# Patient Record
Sex: Male | Born: 1970
Health system: Southern US, Community
[De-identification: ages and names within clinical notes are randomized; demographics above are authoritative.]

## PROBLEM LIST (undated history)

## (undated) DIAGNOSIS — E785 Hyperlipidemia, unspecified: Secondary | ICD-10-CM

## (undated) DIAGNOSIS — I2510 Atherosclerotic heart disease of native coronary artery without angina pectoris: Secondary | ICD-10-CM

## (undated) DIAGNOSIS — F1410 Cocaine abuse, uncomplicated: Secondary | ICD-10-CM

## (undated) DIAGNOSIS — F1010 Alcohol abuse, uncomplicated: Secondary | ICD-10-CM

## (undated) DIAGNOSIS — Z720 Tobacco use: Secondary | ICD-10-CM

## (undated) DIAGNOSIS — I2102 ST elevation (STEMI) myocardial infarction involving left anterior descending coronary artery: Secondary | ICD-10-CM

## (undated) DIAGNOSIS — I10 Essential (primary) hypertension: Secondary | ICD-10-CM

## (undated) HISTORY — DX: ST elevation (STEMI) myocardial infarction involving left anterior descending coronary artery: I21.02

---

## 2001-02-07 ENCOUNTER — Emergency Department (HOSPITAL_COMMUNITY): Admission: EM | Admit: 2001-02-07 | Discharge: 2001-02-08 | Payer: Self-pay | Admitting: Emergency Medicine

## 2001-02-08 ENCOUNTER — Encounter: Payer: Self-pay | Admitting: *Deleted

## 2002-06-12 ENCOUNTER — Emergency Department (HOSPITAL_COMMUNITY): Admission: EM | Admit: 2002-06-12 | Discharge: 2002-06-12 | Payer: Self-pay | Admitting: Emergency Medicine

## 2002-06-12 ENCOUNTER — Encounter: Payer: Self-pay | Admitting: Emergency Medicine

## 2006-11-12 ENCOUNTER — Emergency Department (HOSPITAL_COMMUNITY): Admission: EM | Admit: 2006-11-12 | Discharge: 2006-11-12 | Payer: Self-pay | Admitting: *Deleted

## 2006-11-13 ENCOUNTER — Emergency Department (HOSPITAL_COMMUNITY): Admission: EM | Admit: 2006-11-13 | Discharge: 2006-11-13 | Payer: Self-pay | Admitting: Emergency Medicine

## 2009-08-05 ENCOUNTER — Emergency Department (HOSPITAL_COMMUNITY): Admission: EM | Admit: 2009-08-05 | Discharge: 2009-08-05 | Payer: Self-pay | Admitting: Emergency Medicine

## 2011-02-08 ENCOUNTER — Emergency Department (HOSPITAL_COMMUNITY)
Admission: EM | Admit: 2011-02-08 | Discharge: 2011-02-08 | Disposition: A | Discharge status: Home / Self Care | Payer: Self-pay | Attending: Emergency Medicine | Admitting: Emergency Medicine

## 2011-02-08 ENCOUNTER — Encounter: Payer: Self-pay | Admitting: *Deleted

## 2011-02-08 ENCOUNTER — Ambulatory Visit (HOSPITAL_COMMUNITY): Admission: RE | Admit: 2011-02-08 | Payer: Self-pay | Source: Ambulatory Visit

## 2011-02-08 ENCOUNTER — Emergency Department (HOSPITAL_COMMUNITY): Payer: Self-pay

## 2011-02-08 DIAGNOSIS — J9801 Acute bronchospasm: Secondary | ICD-10-CM | POA: Insufficient documentation

## 2011-02-08 DIAGNOSIS — R079 Chest pain, unspecified: Secondary | ICD-10-CM | POA: Insufficient documentation

## 2011-02-08 DIAGNOSIS — R0602 Shortness of breath: Secondary | ICD-10-CM | POA: Insufficient documentation

## 2011-02-08 DIAGNOSIS — J40 Bronchitis, not specified as acute or chronic: Secondary | ICD-10-CM | POA: Insufficient documentation

## 2011-02-08 DIAGNOSIS — IMO0001 Reserved for inherently not codable concepts without codable children: Secondary | ICD-10-CM | POA: Insufficient documentation

## 2011-02-08 DIAGNOSIS — J069 Acute upper respiratory infection, unspecified: Secondary | ICD-10-CM | POA: Insufficient documentation

## 2011-02-08 DIAGNOSIS — R062 Wheezing: Secondary | ICD-10-CM | POA: Insufficient documentation

## 2011-02-08 DIAGNOSIS — F17200 Nicotine dependence, unspecified, uncomplicated: Secondary | ICD-10-CM | POA: Insufficient documentation

## 2011-02-08 DIAGNOSIS — R05 Cough: Secondary | ICD-10-CM | POA: Insufficient documentation

## 2011-02-08 DIAGNOSIS — R059 Cough, unspecified: Secondary | ICD-10-CM | POA: Insufficient documentation

## 2011-02-08 MED ORDER — ALBUTEROL SULFATE HFA 108 (90 BASE) MCG/ACT IN AERS
2.0000 | INHALATION_SPRAY | RESPIRATORY_TRACT | Status: DC | PRN
  Administered 2011-02-08: 2 via RESPIRATORY_TRACT
  Filled 2011-02-08: qty 6.7

## 2011-02-08 MED ORDER — AZITHROMYCIN 250 MG PO TABS: 250.0000 mg | ORAL_TABLET | Freq: Every day | ORAL | Status: AC

## 2011-02-08 NOTE — ED Notes (Signed)
Patient reports onset of cold sx over 1 week ago.  He has cough.  He states it feels like his ribs are going to pop open.  Patient states his coughing spell is worse when he is laying down.  Patient states he cannot get up the phlegm.  Patient states he is having sob.  Patient states he feels like he has had a fever.  Patient states he has abd pain and rib pain due to coughing

## 2011-02-08 NOTE — ED Notes (Signed)
Complaining of pain in mid back. Also chest pain with cough. Feels like pressure when lying down. Rates pain as 8/10 and 10/10 with cough

## 2011-02-08 NOTE — ED Notes (Signed)
States he has had cough for the past 2 weeks. Worse at night lying down. Nonproductive cough. Also complaining of stomach cramping, headache and sweating with cough. OTC with no relief. States hurts with deep breaths. Coworkers have also been sick

## 2011-02-08 NOTE — ED Provider Notes (Signed)
History     CSN: [id number] Arrival date & time: 02/08/2011  1:57 PM   First MD Initiated Contact with Patient 02/08/11 1531      Chief Complaint  Patient presents with  . URI    (Consider location/radiation/quality/duration/timing/severity/associated sxs/prior treatment) Patient is a 40 y.o. male presenting with cough. The history is provided by the patient. No language interpreter was used.  Cough This is a new problem. The current episode started more than 1 week ago. The problem occurs every few minutes. The problem has been gradually worsening. The cough is non-productive. There has been no fever. Associated symptoms include chest pain, myalgias, shortness of breath and wheezing. He is a smoker.    History reviewed. No pertinent past medical history.  History reviewed. No pertinent past surgical history.  No family history on file.  History  Substance Use Topics  . Smoking status: Current Everyday Smoker  . Smokeless tobacco: Not on file  . Alcohol Use: Yes      Review of Systems  Respiratory: Positive for cough, shortness of breath and wheezing.   Cardiovascular: Positive for chest pain.  Musculoskeletal: Positive for myalgias.  All other systems reviewed and are negative.    Allergies  Review of patient's allergies indicates no known allergies.  Home Medications   Current Outpatient Rx  Name Route Sig Dispense Refill  . GUAIFENESIN 100 MG/5ML PO LIQD Oral Take 200 mg by mouth 3 (three) times daily as needed. cough     . IBUPROFEN 200 MG PO TABS Oral Take 200 mg by mouth every 6 (six) hours as needed. Pain / fever       BP 130/74  Pulse 114  Temp(Src) 97.9 F (36.6 C) (Oral)  Resp 20  Ht 5' 10.5" (1.791 m)  Wt 272 lb (123.378 kg)  BMI 38.48 kg/m2  SpO2 100%  Physical Exam  Nursing note and vitals reviewed. Constitutional: He is oriented to person, place, and time. He appears well-developed and well-nourished.  HENT:  Head: Normocephalic.    Eyes: Conjunctivae are normal. Pupils are equal, round, and reactive to light.  Neck: Normal range of motion. Neck supple.  Cardiovascular: Normal rate and regular rhythm.   Pulmonary/Chest: Effort normal. He has decreased breath sounds in the left upper field and the left lower field.  Abdominal: Soft. Bowel sounds are normal.  Musculoskeletal: Normal range of motion.  Neurological: He is alert and oriented to person, place, and time.  Skin: Skin is warm.  Psychiatric: He has a normal mood and affect.    ED Course  Procedures (including critical care time)  Labs Reviewed - No data to display Dg Chest 2 View  02/08/2011  *RADIOLOGY REPORT*  Clinical Data: Cough.  Chest pain.  Smoker.  CHEST - 2 VIEW  Comparison: None.  Findings: Mild central peribronchial thickening is seen bilaterally.  No evidence of pulmonary infiltrate or edema.  No evidence of pleural effusion.  Heart size is normal.  No mass or lymphadenopathy identified.  IMPRESSION: No active cardiopulmonary disease.  Original Report Authenticated By: Danae Orleans, M.D.     No diagnosis found.  Radiology results reviewed. No indication of pneumonia or infiltrates.  Peribronchial thickening--? Bronchitis.  MDM          Jimmye Norman, NP 02/09/11 [phone]

## 2011-02-11 NOTE — ED Provider Notes (Signed)
Medical screening examination/treatment/procedure(s) were performed by non-physician practitioner and as supervising physician I was immediately available for consultation/collaboration.  Juliet Rude. Rubin Payor, MD 02/11/11 [phone]

## 2011-12-11 ENCOUNTER — Emergency Department (HOSPITAL_COMMUNITY)
Admission: EM | Admit: 2011-12-11 | Discharge: 2011-12-11 | Disposition: A | Discharge status: Home / Self Care | Payer: No Typology Code available for payment source | Attending: Emergency Medicine | Admitting: Emergency Medicine

## 2011-12-11 ENCOUNTER — Encounter (HOSPITAL_COMMUNITY): Payer: Self-pay | Admitting: *Deleted

## 2011-12-11 DIAGNOSIS — M545 Low back pain, unspecified: Secondary | ICD-10-CM | POA: Insufficient documentation

## 2011-12-11 DIAGNOSIS — F17200 Nicotine dependence, unspecified, uncomplicated: Secondary | ICD-10-CM | POA: Insufficient documentation

## 2011-12-11 DIAGNOSIS — S2341XA Sprain of ribs, initial encounter: Secondary | ICD-10-CM | POA: Insufficient documentation

## 2011-12-11 DIAGNOSIS — IMO0002 Reserved for concepts with insufficient information to code with codable children: Secondary | ICD-10-CM

## 2011-12-11 DIAGNOSIS — M79609 Pain in unspecified limb: Secondary | ICD-10-CM | POA: Insufficient documentation

## 2011-12-11 MED ORDER — CYCLOBENZAPRINE HCL 10 MG PO TABS: 10.0000 mg | ORAL_TABLET | Freq: Two times a day (BID) | ORAL | Status: DC | PRN

## 2011-12-11 MED ORDER — IBUPROFEN 800 MG PO TABS: 800.0000 mg | ORAL_TABLET | Freq: Three times a day (TID) | ORAL | Status: DC
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
## 2011-12-11 NOTE — ED Provider Notes (Signed)
Medical screening examination/treatment/procedure(s) were performed by non-physician practitioner and as supervising physician I was immediately available for consultation/collaboration.  Flint Melter, MD 12/11/11 2028
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
## 2015-02-14 ENCOUNTER — Encounter (HOSPITAL_COMMUNITY): Payer: Self-pay

## 2015-02-14 ENCOUNTER — Emergency Department (HOSPITAL_COMMUNITY)
Admission: EM | Admit: 2015-02-14 | Discharge: 2015-02-14 | Disposition: A | Discharge status: Home / Self Care | Payer: Self-pay | Attending: Emergency Medicine | Admitting: Emergency Medicine

## 2015-02-14 DIAGNOSIS — R51 Headache: Secondary | ICD-10-CM | POA: Insufficient documentation

## 2015-02-14 DIAGNOSIS — Z7982 Long term (current) use of aspirin: Secondary | ICD-10-CM | POA: Insufficient documentation

## 2015-02-14 DIAGNOSIS — F17200 Nicotine dependence, unspecified, uncomplicated: Secondary | ICD-10-CM | POA: Insufficient documentation

## 2015-02-14 DIAGNOSIS — Z79899 Other long term (current) drug therapy: Secondary | ICD-10-CM | POA: Insufficient documentation

## 2015-02-14 DIAGNOSIS — H0014 Chalazion left upper eyelid: Secondary | ICD-10-CM | POA: Insufficient documentation

## 2015-02-14 NOTE — Care Management (Signed)
ED CM consulted by H. Jamse Mead PA-C regarding establishing primary  care, and referral to opthalmology. Met with patient , patient confirms being uninsured and without a PCP.  Discussed that EDP is wanting patient to follow-up as soon as possible with Opthalmology, he is amendable. Also discussed the [facility] for establishing care for referral he is agreeable Appointment scheduled at the [facility]   12/14 at 10:00, referral placed via [facility] at the office  Dr.Narenda Posey Pronto for consult. Updated H Patel PA-C.

## 2015-02-14 NOTE — Discharge Instructions (Signed)
Chalazion Apply warm compresses to the area several times a day. A chalazion is a swelling or lump on the eyelid. It can affect the upper or lower eyelid. CAUSES This condition may be caused by:  Long-lasting (chronic) inflammation of the eyelid glands.  A blocked oil gland in the eyelid. SYMPTOMS Symptoms of this condition include:  A swelling on the eyelid. The swelling may spread to areas around the eye.  A hard lump on the eyelid. This lump may make it hard to see out of the eye. DIAGNOSIS This condition is diagnosed with an examination of the eye. TREATMENT This condition is treated by applying a warm compress to the eyelid. If the condition does not improve after two days, it may be treated with:  Surgery.  Medicine that is injected into the chalazion by a health care provider.  Medicine that is applied to the eye. HOME CARE INSTRUCTIONS  Do not touch the chalazion.  Do not try to remove the pus, such as by squeezing the chalazion or sticking it with a pin or needle.  Do not rub your eyes.  Wash your hands often. Dry your hands with a clean towel.  Keep your face, scalp, and eyebrows clean.  Avoid wearing eye makeup.  Apply a warm, moist compress to the eyelid 4-6 times a day for 10-15 minutes at a time. This will help to open any blocked glands and help to reduce redness and swelling.  Apply over-the-counter and prescription medicines only as told by your health care provider.  If the chalazion does not break open (rupture) on its own in a month, return to your health care provider.  Keep all follow-up appointments as told by your health care provider. This is important. SEEK MEDICAL CARE IF:  Your eyelid has not improved in 4 weeks.  Your eyelid is getting worse.  You have a fever.  The chalazion does not rupture on its own with home treatment in a month. SEEK IMMEDIATE MEDICAL CARE IF:  You have pain in your eye.  Your vision changes.  The  chalazion becomes painful or red  The chalazion gets bigger.   This information is not intended to replace advice given to you by your health care provider. Make sure you discuss any questions you have with your health care provider.   Document Released: 02/16/2000 Document Revised: 11/09/2014 Document Reviewed: 06/13/2014 Elsevier Interactive Patient Education [employer]2016 [employer] Inc.

## 2015-02-14 NOTE — ED Provider Notes (Signed)
CSN: [id number]     Arrival date & time 02/14/15  1655 History  By signing my name below, I, Octavia Heir, attest that this documentation has been prepared under the direction and in the presence of [employer], PA-C. Electronically Signed: Octavia Heir, ED Scribe. 02/14/2015. 7:56 PM.    Chief Complaint  Patient presents with  . Eye Pain    The history is provided by the patient. No language interpreter was used.   HPI Comments: Rodney Lloyd is a 44 y.o. male who presents to the Emergency Department complaining of a moderate, constant, gradual worsening lump on left eyelid onset 3 months ago. He endorses associated headache and blurry vision. Pt notes the bump on his inner eyelid was small and became gradually worse over the past two weeks. He denies any prior treatment. He denies any fever.  History reviewed. No pertinent past medical history. History reviewed. No pertinent past surgical history. Family History  Problem Relation Age of Onset  . Hyperlipidemia Mother   . Heart disease Father    Social History  Substance Use Topics  . Smoking status: Current Every Day Smoker  . Smokeless tobacco: None  . Alcohol Use: Yes    Review of Systems  Constitutional: Negative for fever.  Eyes: Positive for pain and visual disturbance.  Neurological: Positive for headaches.      Allergies  Review of patient's allergies indicates no known allergies.  Home Medications   Prior to Admission medications   Medication Sig Start Date End Date Taking? Authorizing Provider  acetaminophen (TYLENOL) 325 MG tablet Take 650 mg by mouth every 6 (six) hours as needed.    Historical Provider, MD  aspirin 325 MG tablet Take 325 mg by mouth 2 (two) times daily as needed. Reported on 02/15/2015    Historical Provider, MD  cyclobenzaprine (FLEXERIL) 10 MG tablet Take 1 tablet (10 mg total) by mouth 2 (two) times daily as needed for muscle spasms. Patient not taking: Reported on 02/15/2015  12/11/11   Roxy Horseman, PA-C  hydrochlorothiazide (HYDRODIURIL) 25 MG tablet Take 1 tablet (25 mg total) by mouth daily. 02/15/15   Jaclyn Shaggy, MD  ibuprofen (ADVIL,MOTRIN) 800 MG tablet Take 1 tablet (800 mg total) by mouth 3 (three) times daily. Patient not taking: Reported on 02/15/2015 12/11/11   Roxy Horseman, PA-C  nicotine (NICODERM CQ - DOSED IN MG/24 HOURS) 14 mg/24hr patch Place 1 patch (14 mg total) onto the skin daily. 02/15/15   Jaclyn Shaggy, MD   Triage vitals: BP 175/107 mmHg  Pulse 72  Temp(Src) 98.1 F (36.7 C) (Oral)  Resp 20  SpO2 98% Physical Exam  Constitutional: He is oriented to person, place, and time. He appears well-developed and well-nourished. No distress.  HENT:  Head: Normocephalic and atraumatic.  Eyes: Conjunctivae and EOM are normal.  EOMs intact, PERRL, conjunctiva is normal. Painful large nodule on the left upper eyelid with no surrounding erythema and no active drainage. The nodule is causing the upper eyelid to drool and encroaching on his visual field.   Neck: Normal range of motion. Neck supple.  Cardiovascular: Normal rate, regular rhythm and normal heart sounds.   Pulmonary/Chest: Effort normal and breath sounds normal.  Musculoskeletal: Normal range of motion. He exhibits no edema.  Neurological: He is alert and oriented to person, place, and time.  Skin: Skin is warm and dry.  Psychiatric: He has a normal mood and affect. His behavior is normal.  Nursing note and vitals reviewed.  ED Course  Procedures  DIAGNOSTIC STUDIES: Oxygen Saturation is 98% on RA, normal by my interpretation.  COORDINATION OF CARE:  7:56 PM Discussed treatment plan with pt at bedside and pt agreed to plan.  Labs Review Labs Reviewed - No data to display  Imaging Review No results found.   EKG Interpretation None      MDM  Pt presents to the ED with moderate, gradual worsening chalazion in left upper eyelid onset 3 weeks ago. He has a chalazion  over the left eyelid. There is no signs of infection and antibiotics is not indicated for chalazion. His vision changes are due to his drooping eyelid but he denies any actual change in vision. I discussed this patient with Burna MortimerWanda from case management since the patient does not have insurance and will most likely need surgery. He was given a referral for ophthalmology. I discussed applying warm compresses to the area. I also discussed return precautions the patient and he verbally agrees with the plan. Final diagnoses:  Chalazion of left upper eyelid   I personally performed the services described in this documentation, which was scribed in my presence. The recorded information has been reviewed and is accurate.   Catha GosselinHanna Patel-Mills, PA-C 02/17/15 1520  Raeford RazorStephen Kohut, MD 02/21/15 1245

## 2015-02-14 NOTE — ED Notes (Signed)
Pt here with c/o left eye pain and has a bump on his upper eye lid, noticed a couple of weeks ago. It started off a very small but got larger within the past week. Conjunctiva WDL, no redness.

## 2015-02-15 ENCOUNTER — Ambulatory Visit: Payer: Self-pay | Attending: Family Medicine | Admitting: Family Medicine

## 2015-02-15 ENCOUNTER — Encounter: Payer: Self-pay | Admitting: Family Medicine

## 2015-02-15 VITALS — BP 142/90 | HR 81 | Temp 98.0°F | Resp 14 | Ht 70.5 in | Wt 210.6 lb

## 2015-02-15 DIAGNOSIS — Z7982 Long term (current) use of aspirin: Secondary | ICD-10-CM | POA: Insufficient documentation

## 2015-02-15 DIAGNOSIS — H578 Other specified disorders of eye and adnexa: Secondary | ICD-10-CM | POA: Insufficient documentation

## 2015-02-15 DIAGNOSIS — I10 Essential (primary) hypertension: Secondary | ICD-10-CM | POA: Insufficient documentation

## 2015-02-15 DIAGNOSIS — H5789 Other specified disorders of eye and adnexa: Secondary | ICD-10-CM

## 2015-02-15 DIAGNOSIS — Z720 Tobacco use: Secondary | ICD-10-CM | POA: Insufficient documentation

## 2015-02-15 LAB — COMPREHENSIVE METABOLIC PANEL
ALK PHOS: 69 U/L (ref 40–115)
ALT: 13 U/L (ref 9–46)
AST: 18 U/L (ref 10–40)
Albumin: 4.1 g/dL (ref 3.6–5.1)
BILIRUBIN TOTAL: 0.4 mg/dL (ref 0.2–1.2)
BUN: 17 mg/dL (ref 7–25)
CO2: 27 mmol/L (ref 20–31)
Calcium: 9.5 mg/dL (ref 8.6–10.3)
Chloride: 105 mmol/L (ref 98–110)
Creat: 1.13 mg/dL (ref 0.60–1.35)
GLUCOSE: 82 mg/dL (ref 65–99)
Potassium: 4.3 mmol/L (ref 3.5–5.3)
SODIUM: 141 mmol/L (ref 135–146)
Total Protein: 6.9 g/dL (ref 6.1–8.1)

## 2015-02-15 MED ORDER — HYDROCHLOROTHIAZIDE 25 MG PO TABS
25.0000 mg | ORAL_TABLET | Freq: Every day | ORAL | Status: DC
Start: 2015-02-15 — End: 2018-01-13

## 2015-02-15 MED ORDER — NICOTINE 14 MG/24HR TD PT24: 14.0000 mg | MEDICATED_PATCH | Freq: Every day | TRANSDERMAL | Status: DC

## 2015-02-15 NOTE — Progress Notes (Signed)
Subjective:  Patient ID: Rodney Lloyd, male    DOB: 04-17-1970  Age: 44 y.o. MRN: [id number]  CC: Establish Care and Eye Pain   HPI Rodney Lloyd is a 44 year old male who was seen at the emergency room for left eyelid swelling which he has had for the last 2 months and has progressively worsened over the last 3 weeks with increase in size and occasional pain and sometimes early morning crusting. He was referred to the clinic so we could refer him to ophthalmology. He admits to having intermittent swelling of his left upper eyelid which he thought was a stye with resulting resolution and recurrence only this time around it  continued to increase in size. He denies visual loss but does state that the swollen eyelid obscures part of his visual field.  Blood pressure is elevated and he had no previous known history of hypertension.  Outpatient Prescriptions Prior to Visit  Medication Sig Dispense Refill  . aspirin 325 MG tablet Take 325 mg by mouth 2 (two) times daily as needed. Reported on 02/15/2015    . cyclobenzaprine (FLEXERIL) 10 MG tablet Take 1 tablet (10 mg total) by mouth 2 (two) times daily as needed for muscle spasms. (Patient not taking: Reported on 02/15/2015) 14 tablet 0  . ibuprofen (ADVIL,MOTRIN) 800 MG tablet Take 1 tablet (800 mg total) by mouth 3 (three) times daily. (Patient not taking: Reported on 02/15/2015) 21 tablet 0   No facility-administered medications prior to visit.    ROS Review of Systems  Constitutional: Negative for activity change and appetite change.  HENT: Negative for sinus pressure and sore throat.   Eyes:       See history of present illness  Respiratory: Negative for chest tightness, shortness of breath and wheezing.   Gastrointestinal: Negative for abdominal pain, constipation and abdominal distention.  Genitourinary: Negative.   Musculoskeletal: Negative.   Psychiatric/Behavioral: Negative for behavioral problems and dysphoric mood.     Objective:  BP 142/90 mmHg  Pulse 81  Temp(Src) 98 F (36.7 C)  Resp 14  Ht 5' 10.5" (1.791 m)  Wt 210 lb 9.6 oz (95.528 kg)  BMI 29.78 kg/m2  SpO2 98%  BP/Weight 02/15/2015 02/14/2015 12/11/2011  Systolic BP 142 169 141  Diastolic BP 90 97 95  Wt. (Lbs) 210.6 - -  BMI 29.78 - -      Physical Exam  Constitutional: He is oriented to person, place, and time. He appears well-developed and well-nourished.  Eyes:  Left upper eyelid with 1 x 1 cm fluctuant mass which is mildly tender. No eye discharge or erythema. Right eye is normal  Cardiovascular: Normal rate, normal heart sounds and intact distal pulses.   No murmur heard. Pulmonary/Chest: Effort normal and breath sounds normal. He has no wheezes. He has no rales. He exhibits no tenderness.  Abdominal: Soft. Bowel sounds are normal. He exhibits no distension and no mass. There is no tenderness.  Musculoskeletal: Normal range of motion.  Neurological: He is alert and oriented to person, place, and time.     Assessment & Plan:   1. Essential hypertension Commenced on antihypertensive due to the presence of 2 separate elevated blood pressure readings on 2 different days. - Comprehensive metabolic panel - hydrochlorothiazide (HYDRODIURIL) 25 MG tablet; Take 1 tablet (25 mg total) by mouth daily.  Dispense: 90 tablet; Refill: 3  2. Eye swelling or mass I have sent a message to the referral coordinator for resources available for referral as  he has no medical coverage; will try to expedite [employer] discount application process. Discussed return and ED precautions. - Ambulatory referral to Ophthalmology  3. Tobacco abuse Educated on smoking cessation. - nicotine (NICODERM CQ - DOSED IN MG/24 HOURS) 14 mg/24hr patch; Place 1 patch (14 mg total) onto the skin daily.  Dispense: 28 patch; Refill: 1   Meds ordered this encounter  Medications  . hydrochlorothiazide (HYDRODIURIL) 25 MG tablet    Sig: Take 1 tablet (25 mg total)  by mouth daily.    Dispense:  90 tablet    Refill:  3  . nicotine (NICODERM CQ - DOSED IN MG/24 HOURS) 14 mg/24hr patch    Sig: Place 1 patch (14 mg total) onto the skin daily.    Dispense:  28 patch    Refill:  1    Follow-up: No Follow-up on file.   Jaclyn ShaggyEnobong Amao MD

## 2015-02-15 NOTE — Patient Instructions (Signed)
Hypertension Hypertension, commonly called high blood pressure, is when the force of blood pumping through your arteries is too strong. Your arteries are the blood vessels that carry blood from your heart throughout your body. A blood pressure reading consists of a higher number over a lower number, such as 110/72. The higher number (systolic) is the pressure inside your arteries when your heart pumps. The lower number (diastolic) is the pressure inside your arteries when your heart relaxes. Ideally you want your blood pressure below 120/80. Hypertension forces your heart to work harder to pump blood. Your arteries may become narrow or stiff. Having untreated or uncontrolled hypertension can cause heart attack, stroke, kidney disease, and other problems. RISK FACTORS Some risk factors for high blood pressure are controllable. Others are not.  Risk factors you cannot control include:   Race. You may be at higher risk if you are African American.  Age. Risk increases with age.  Gender. Men are at higher risk than women before age 45 years. After age 65, women are at higher risk than men. Risk factors you can control include:  Not getting enough exercise or physical activity.  Being overweight.  Getting too much fat, sugar, calories, or salt in your diet.  Drinking too much alcohol. SIGNS AND SYMPTOMS Hypertension does not usually cause signs or symptoms. Extremely high blood pressure (hypertensive crisis) may cause headache, anxiety, shortness of breath, and nosebleed. DIAGNOSIS To check if you have hypertension, your health care provider will measure your blood pressure while you are seated, with your arm held at the level of your heart. It should be measured at least twice using the same arm. Certain conditions can cause a difference in blood pressure between your right and left arms. A blood pressure reading that is higher than normal on one occasion does not mean that you need treatment. If  it is not clear whether you have high blood pressure, you may be asked to return on a different day to have your blood pressure checked again. Or, you may be asked to monitor your blood pressure at home for 1 or more weeks. TREATMENT Treating high blood pressure includes making lifestyle changes and possibly taking medicine. Living a healthy lifestyle can help lower high blood pressure. You may need to change some of your habits. Lifestyle changes may include:  Following the DASH diet. This diet is high in fruits, vegetables, and whole grains. It is low in salt, red meat, and added sugars.  Keep your sodium intake below 2,300 mg per day.  Getting at least 30-45 minutes of aerobic exercise at least 4 times per week.  Losing weight if necessary.  Not smoking.  Limiting alcoholic beverages.  Learning ways to reduce stress. Your health care provider may prescribe medicine if lifestyle changes are not enough to get your blood pressure under control, and if one of the following is true:  You are 18-59 years of age and your systolic blood pressure is above 140.  You are 60 years of age or older, and your systolic blood pressure is above 150.  Your diastolic blood pressure is above 90.  You have diabetes, and your systolic blood pressure is over 140 or your diastolic blood pressure is over 90.  You have kidney disease and your blood pressure is above 140/90.  You have heart disease and your blood pressure is above 140/90. Your personal target blood pressure may vary depending on your medical conditions, your age, and other factors. HOME CARE INSTRUCTIONS    Have your blood pressure rechecked as directed by your health care provider.   Take medicines only as directed by your health care provider. Follow the directions carefully. Blood pressure medicines must be taken as prescribed. The medicine does not work as well when you skip doses. Skipping doses also puts you at risk for  problems.  Do not smoke.   Monitor your blood pressure at home as directed by your health care provider. SEEK MEDICAL CARE IF:   You think you are having a reaction to medicines taken.  You have recurrent headaches or feel dizzy.  You have swelling in your ankles.  You have trouble with your vision. SEEK IMMEDIATE MEDICAL CARE IF:  You develop a severe headache or confusion.  You have unusual weakness, numbness, or feel faint.  You have severe chest or abdominal pain.  You vomit repeatedly.  You have trouble breathing. MAKE SURE YOU:   Understand these instructions.  Will watch your condition.  Will get help right away if you are not doing well or get worse.   This information is not intended to replace advice given to you by your health care provider. Make sure you discuss any questions you have with your health care provider.   Document Released: 02/18/2005 Document Revised: 07/05/2014 Document Reviewed: 12/11/2012 Elsevier Interactive Patient Education 2016 [employer] Inc[employer]  

## 2015-02-15 NOTE — Progress Notes (Signed)
Patient reports ED tells him he needs eye surgery to remove growth on his left eye Growth has been on his eye for several months and has recently grown larger He does not have the [employer] discount or orange card but does have the application ED put in opthalmology referral last night RN explained to patient that he needed orange card before he would be see. Patient would like information on quitting smoking

## 2015-02-16 ENCOUNTER — Telehealth: Payer: Self-pay

## 2015-02-16 NOTE — Telephone Encounter (Signed)
-----   Message from Jaclyn ShaggyEnobong Amao, MD sent at 02/16/2015  8:51 AM EST ----- Please inform the patient that labs are normal. Thank you.

## 2015-02-16 NOTE — Telephone Encounter (Signed)
CMA called patient, patient did not answer. Patient was left a voice message asking to return my call asap.

## 2015-02-17 ENCOUNTER — Encounter: Payer: Self-pay | Admitting: Family Medicine

## 2015-02-17 NOTE — Telephone Encounter (Signed)
CMA called patient, patient did not answer. Message was left for patient to call me asap.

## 2015-02-17 NOTE — Telephone Encounter (Signed)
Patient returned nurse phone call Best contacted between 12 and 1

## 2015-02-20 NOTE — Telephone Encounter (Signed)
CMA called patient, patient verified name and DOB. Patient was given lab results, verbalized he understood with no further questions. 

## 2015-03-10 ENCOUNTER — Ambulatory Visit: Payer: Self-pay | Admitting: Family Medicine

## 2015-03-14 ENCOUNTER — Emergency Department (HOSPITAL_COMMUNITY): Payer: Self-pay

## 2015-03-14 ENCOUNTER — Emergency Department (HOSPITAL_COMMUNITY)
Admission: EM | Admit: 2015-03-14 | Discharge: 2015-03-14 | Disposition: A | Discharge status: Home / Self Care | Payer: Self-pay | Attending: Emergency Medicine | Admitting: Emergency Medicine

## 2015-03-14 ENCOUNTER — Encounter (HOSPITAL_COMMUNITY): Payer: Self-pay | Admitting: Emergency Medicine

## 2015-03-14 DIAGNOSIS — F17200 Nicotine dependence, unspecified, uncomplicated: Secondary | ICD-10-CM | POA: Insufficient documentation

## 2015-03-14 DIAGNOSIS — H00014 Hordeolum externum left upper eyelid: Secondary | ICD-10-CM | POA: Insufficient documentation

## 2015-03-14 DIAGNOSIS — R51 Headache: Secondary | ICD-10-CM | POA: Insufficient documentation

## 2015-03-14 DIAGNOSIS — H538 Other visual disturbances: Secondary | ICD-10-CM | POA: Insufficient documentation

## 2015-03-14 DIAGNOSIS — Z7982 Long term (current) use of aspirin: Secondary | ICD-10-CM | POA: Insufficient documentation

## 2015-03-14 DIAGNOSIS — H00016 Hordeolum externum left eye, unspecified eyelid: Secondary | ICD-10-CM

## 2015-03-14 DIAGNOSIS — I10 Essential (primary) hypertension: Secondary | ICD-10-CM | POA: Insufficient documentation

## 2015-03-14 HISTORY — DX: Essential (primary) hypertension: I10

## 2015-03-14 LAB — CBC WITH DIFFERENTIAL/PLATELET
BASOS ABS: 0 10*3/uL (ref 0.0–0.1)
BASOS PCT: 0 %
Eosinophils Absolute: 0.2 10*3/uL (ref 0.0–0.7)
Eosinophils Relative: 4 %
HEMATOCRIT: 46 % (ref 39.0–52.0)
HEMOGLOBIN: 15.4 g/dL (ref 13.0–17.0)
LYMPHS PCT: 48 %
Lymphs Abs: 2.5 10*3/uL (ref 0.7–4.0)
MCH: 30.4 pg (ref 26.0–34.0)
MCHC: 33.5 g/dL (ref 30.0–36.0)
MCV: 90.9 fL (ref 78.0–100.0)
Monocytes Absolute: 0.5 10*3/uL (ref 0.1–1.0)
Monocytes Relative: 9 %
NEUTROS ABS: 2.1 10*3/uL (ref 1.7–7.7)
NEUTROS PCT: 39 %
Platelets: 168 10*3/uL (ref 150–400)
RBC: 5.06 MIL/uL (ref 4.22–5.81)
RDW: 12.4 % (ref 11.5–15.5)
WBC: 5.4 10*3/uL (ref 4.0–10.5)

## 2015-03-14 LAB — BASIC METABOLIC PANEL
ANION GAP: 10 (ref 5–15)
BUN: 11 mg/dL (ref 6–20)
CALCIUM: 9.4 mg/dL (ref 8.9–10.3)
CO2: 27 mmol/L (ref 22–32)
Chloride: 105 mmol/L (ref 101–111)
Creatinine, Ser: 0.97 mg/dL (ref 0.61–1.24)
Glucose, Bld: 78 mg/dL (ref 65–99)
POTASSIUM: 3.9 mmol/L (ref 3.5–5.1)
Sodium: 142 mmol/L (ref 135–145)

## 2015-03-14 MED ORDER — TETRACAINE HCL 0.5 % OP SOLN
2.0000 [drp] | Freq: Once | OPHTHALMIC | Status: AC
  Administered 2015-03-14: 2 [drp] via OPHTHALMIC
  Filled 2015-03-14: qty 2

## 2015-03-14 MED ORDER — SODIUM CHLORIDE 0.9 % IV BOLUS (SEPSIS)
1000.0000 mL | Freq: Once | INTRAVENOUS | Status: AC
  Administered 2015-03-14: 1000 mL via INTRAVENOUS

## 2015-03-14 MED ORDER — CEPHALEXIN 500 MG PO CAPS: 500.0000 mg | ORAL_CAPSULE | Freq: Four times a day (QID) | ORAL | Status: DC

## 2015-03-14 MED ORDER — ACETAMINOPHEN 325 MG PO TABS
650.0000 mg | ORAL_TABLET | Freq: Once | ORAL | Status: AC
  Administered 2015-03-14: 650 mg via ORAL
  Filled 2015-03-14: qty 2

## 2015-03-14 MED ORDER — IOHEXOL 300 MG/ML  SOLN
75.0000 mL | Freq: Once | INTRAMUSCULAR | Status: AC | PRN
  Administered 2015-03-14: 75 mL via INTRAVENOUS

## 2015-03-14 NOTE — ED Provider Notes (Signed)
CSN: [id number]     Arrival date & time 03/14/15  1406 History   First MD Initiated Contact with Patient 03/14/15 1744     Chief Complaint  Patient presents with  . Eye Pain     (Consider location/radiation/quality/duration/timing/severity/associated sxs/prior Treatment) HPI  45 year old male with a history of hypertension presents with worsening left eyelid pain and swelling. He states for the past couple months he has had some sort of swelling but over the last 4 weeks it has progressively worsened. It is becoming painful. He has tried ibuprofen. He is also having ocular pain on the left side and intermittent blurred vision. He is also having left-sided headaches. Denies any fevers. When he wakes up first thing in the morning there is drainage/matting of his left eyelid.  Past Medical History  Diagnosis Date  . Hypertension    History reviewed. No pertinent past surgical history. Family History  Problem Relation Age of Onset  . Hyperlipidemia Mother   . Heart disease Father    Social History  Substance Use Topics  . Smoking status: Current Every Day Smoker  . Smokeless tobacco: None  . Alcohol Use: Yes    Review of Systems  Constitutional: Negative for fever.  HENT: Negative for facial swelling.   Eyes: Positive for pain and visual disturbance. Negative for redness.  Neurological: Positive for headaches.  All other systems reviewed and are negative.     Allergies  Review of patient's allergies indicates no known allergies.  Home Medications   Prior to Admission medications   Medication Sig Start Date End Date Taking? Authorizing Provider  acetaminophen (TYLENOL) 325 MG tablet Take 650 mg by mouth every 6 (six) hours as needed.    Historical Provider, MD  aspirin 325 MG tablet Take 325 mg by mouth 2 (two) times daily as needed. Reported on 02/15/2015    Historical Provider, MD  cyclobenzaprine (FLEXERIL) 10 MG tablet Take 1 tablet (10 mg total) by mouth 2 (two) times  daily as needed for muscle spasms. Patient not taking: Reported on 02/15/2015 12/11/11   Roxy Horsemanobert Browning, PA-C  hydrochlorothiazide (HYDRODIURIL) 25 MG tablet Take 1 tablet (25 mg total) by mouth daily. 02/15/15   Jaclyn ShaggyEnobong Amao, MD  ibuprofen (ADVIL,MOTRIN) 800 MG tablet Take 1 tablet (800 mg total) by mouth 3 (three) times daily. Patient not taking: Reported on 02/15/2015 12/11/11   Roxy Horsemanobert Browning, PA-C  nicotine (NICODERM CQ - DOSED IN MG/24 HOURS) 14 mg/24hr patch Place 1 patch (14 mg total) onto the skin daily. 02/15/15   Jaclyn ShaggyEnobong Amao, MD   BP 147/96 mmHg  Pulse 69  Temp(Src) 98.9 F (37.2 C) (Oral)  Resp 18  Ht 5\' 8"  (1.727 m)  Wt 210 lb (95.255 kg)  BMI 31.94 kg/m2  SpO2 100% Physical Exam  Constitutional: He is oriented to person, place, and time. He appears well-developed and well-nourished.  HENT:  Head: Normocephalic and atraumatic.  Right Ear: External ear normal.  Left Ear: External ear normal.  Nose: Nose normal.  Eyes: Conjunctivae and EOM are normal. Pupils are equal, round, and reactive to light. Right eye exhibits no discharge. Left eye exhibits hordeolum (large circular mildly erythematous lesion of left upper eyelid (~1 cm)). Left eye exhibits no discharge.  IOP 20-24 bilaterally  Neck: Neck supple.  Pulmonary/Chest: Effort normal.  Abdominal: He exhibits no distension.  Musculoskeletal: He exhibits no edema.  Neurological: He is alert and oriented to person, place, and time.  Skin: Skin is warm and dry.  Nursing  note and vitals reviewed.   ED Course  Procedures (including critical care time) Labs Review Labs Reviewed  BASIC METABOLIC PANEL  CBC WITH DIFFERENTIAL/PLATELET    Imaging Review Ct Orbits W/cm  03/14/2015  CLINICAL DATA:  Bump on the left eye lid for 2 months. Blurred vision in the left side. EXAM: CT ORBITS WITH CONTRAST TECHNIQUE: Multidetector CT imaging of the orbits was performed following the bolus administration of intravenous contrast.  CONTRAST:  75mL OMNIPAQUE IOHEXOL 300 MG/ML  SOLN COMPARISON:  None. FINDINGS: There is a 12 mm nodule arising from the left upper eyelid likely reflecting a stye. There is no other focal abnormality. There is no preseptal soft tissue swelling. The globes are intact. The orbital walls are intact. The orbital floors are intact. The maxilla is intact. The zygomatic arches are intact. The nasal septum is midline. There is no nasal bone fracture. The temporomandibular joints are normal. There is mucosal thickening of the right maxillary sinus and right sphenoid sinus. The visualized portions of the mastoid sinuses are well aerated. IMPRESSION: 1. 12 mm nodule arising from the left upper eyelid likely reflecting a stye. Electronically Signed   By: Elige Ko   On: 03/14/2015 19:47   I have personally reviewed and evaluated these images and lab results as part of my medical decision-making.   EKG Interpretation None      MDM   Final diagnoses:  Hordeolum externum (stye), left  Blurry vision, left eye    Discussed patient presentation with ophthalmology, Dr. Edrick Oh, who recommends follow up with her partner, Dr. Alonna Buckler, within about a week for possible drainage. Unclear exactly why he is having blurry vision and increased pain given negative CT and no signs of ocular abnormalities. IOP equal. Given some mild erythema of stye will give keflex to cover for bacterial cause. Advised of return precautions.    Pricilla Loveless, MD 03/14/15 2342

## 2015-03-14 NOTE — ED Notes (Signed)
Pt states for the last 3 weeks he has had this swelling on his left eye lid that he has been seen for but not getting any better. Pt states for 2 weeks his vision seems to be getting blurry and also has a left sided headache. Pt has large swelling to left eye lid.

## 2015-03-15 MED FILL — CEPHALEXIN 500 MG CAPSULE: 500 | 7 days supply | Qty: 28 | Fill #0

## 2015-03-21 ENCOUNTER — Ambulatory Visit: Payer: Self-pay | Attending: Family Medicine

## 2015-03-29 ENCOUNTER — Ambulatory Visit: Payer: Self-pay

## 2015-03-31 MED FILL — HYDROCHLOROTHIAZIDE 25 MG T: 25 | 30 days supply | Qty: 30 | Fill #1

## 2015-03-31 MED FILL — ERYTHROMYCIN EYE OINTMENT: 5 | 7 days supply | Qty: 4 | Fill #0

## 2015-04-20 ENCOUNTER — Emergency Department (HOSPITAL_COMMUNITY)
Admission: EM | Admit: 2015-04-20 | Discharge: 2015-04-20 | Disposition: A | Discharge status: Home / Self Care | Payer: Self-pay | Attending: Emergency Medicine | Admitting: Emergency Medicine

## 2015-04-20 ENCOUNTER — Encounter (HOSPITAL_COMMUNITY): Payer: Self-pay | Admitting: Emergency Medicine

## 2015-04-20 ENCOUNTER — Emergency Department (HOSPITAL_COMMUNITY): Payer: Self-pay

## 2015-04-20 DIAGNOSIS — Y998 Other external cause status: Secondary | ICD-10-CM | POA: Insufficient documentation

## 2015-04-20 DIAGNOSIS — Y9389 Activity, other specified: Secondary | ICD-10-CM | POA: Insufficient documentation

## 2015-04-20 DIAGNOSIS — S20212A Contusion of left front wall of thorax, initial encounter: Secondary | ICD-10-CM | POA: Insufficient documentation

## 2015-04-20 DIAGNOSIS — Z792 Long term (current) use of antibiotics: Secondary | ICD-10-CM | POA: Insufficient documentation

## 2015-04-20 DIAGNOSIS — W08XXXA Fall from other furniture, initial encounter: Secondary | ICD-10-CM | POA: Insufficient documentation

## 2015-04-20 DIAGNOSIS — W01198A Fall on same level from slipping, tripping and stumbling with subsequent striking against other object, initial encounter: Secondary | ICD-10-CM | POA: Insufficient documentation

## 2015-04-20 DIAGNOSIS — I10 Essential (primary) hypertension: Secondary | ICD-10-CM | POA: Insufficient documentation

## 2015-04-20 DIAGNOSIS — Y9289 Other specified places as the place of occurrence of the external cause: Secondary | ICD-10-CM | POA: Insufficient documentation

## 2015-04-20 DIAGNOSIS — F17200 Nicotine dependence, unspecified, uncomplicated: Secondary | ICD-10-CM | POA: Insufficient documentation

## 2015-04-20 DIAGNOSIS — Z79899 Other long term (current) drug therapy: Secondary | ICD-10-CM | POA: Insufficient documentation

## 2015-04-20 LAB — URINALYSIS, ROUTINE W REFLEX MICROSCOPIC
BILIRUBIN URINE: NEGATIVE
GLUCOSE, UA: NEGATIVE mg/dL
HGB URINE DIPSTICK: NEGATIVE
Ketones, ur: NEGATIVE mg/dL
Leukocytes, UA: NEGATIVE
Nitrite: NEGATIVE
Protein, ur: NEGATIVE mg/dL
SPECIFIC GRAVITY, URINE: 1.023 (ref 1.005–1.030)
pH: 6 (ref 5.0–8.0)

## 2015-04-20 MED ORDER — DICLOFENAC SODIUM 50 MG PO TBEC: 50.0000 mg | DELAYED_RELEASE_TABLET | Freq: Two times a day (BID) | ORAL | Status: DC

## 2015-04-20 MED ORDER — METHOCARBAMOL 500 MG PO TABS: 500.0000 mg | ORAL_TABLET | Freq: Two times a day (BID) | ORAL | Status: DC

## 2015-04-20 NOTE — ED Notes (Signed)
Patient is alert and orientedx4.  Patient was explained discharge instructions and they understood them with no questions.   

## 2015-04-20 NOTE — ED Provider Notes (Signed)
CSN: [id number]     Arrival date & time 04/20/15  2017 History  By signing my name below, I, Soijett Blue, attest that this documentation has been prepared under the direction and in the presence of Kerrie Buffalo, NP Electronically Signed: Soijett Blue, ED Scribe. 04/20/2015. 8:49 PM.   Chief Complaint  Patient presents with  . Back Pain    The patient fell back on to the tip of a dresser and it hit the left side of his back.  The patient said he has been taking ibuprofen and it is not working. He said it  happened sunday and it has gotten worse instead of better.       Patient is a 45 y.o. male presenting with back pain. The history is provided by the patient. No language interpreter was used.  Back Pain Radiates to:  Does not radiate Pain severity:  Moderate Pain is:  Same all the time Onset quality:  Sudden Duration:  5 days Timing:  Constant Progression:  Unchanged Chronicity:  New Context: falling   Relieved by:  Nothing Worsened by:  Deep breathing, coughing and sneezing Ineffective treatments:  NSAIDs and cold packs Associated symptoms: no abdominal pain and no fever     Rodney Lloyd is a 45 y.o. male with a medical hx of HTN who presents to the Emergency Department complaining of constant, moderate, left lower back pain onset 5 days ago. He notes that he fell backwards onto the tip of a dresser and struck his left sided back.  He reports that his left lower back pain is worsened with coughing and sneezing. Pt denies alleviating factors. Pt denies pain anywhere else at this time. He notes that he has tried ibuprofen and ice with no relief of his symptoms. He denies hematuria, color change, rash, wound, fever, abdominal pain, and any other symptoms.    Past Medical History  Diagnosis Date  . Hypertension    History reviewed. No pertinent past surgical history. Family History  Problem Relation Age of Onset  . Hyperlipidemia Mother   . Heart disease Father    Social  History  Substance Use Topics  . Smoking status: Current Every Day Smoker  . Smokeless tobacco: None  . Alcohol Use: Yes    Review of Systems  Constitutional: Negative for fever.  Gastrointestinal: Negative for abdominal pain.  Genitourinary: Negative for hematuria.  Musculoskeletal: Positive for back pain.       Left posterior rib pain  Skin: Negative for color change, rash and wound.  All other systems reviewed and are negative.     Allergies  Review of patient's allergies indicates no known allergies.  Home Medications   Prior to Admission medications   Medication Sig Start Date End Date Taking? Authorizing Provider  acetaminophen (TYLENOL) 325 MG tablet Take 650 mg by mouth every 6 (six) hours as needed for mild pain.     Historical Provider, MD  cephALEXin (KEFLEX) 500 MG capsule Take 1 capsule (500 mg total) by mouth 4 (four) times daily. 03/14/15   Pricilla Loveless, MD  diclofenac (VOLTAREN) 50 MG EC tablet Take 1 tablet (50 mg total) by mouth 2 (two) times daily. 04/20/15   Hope Orlene Och, NP  hydrochlorothiazide (HYDRODIURIL) 25 MG tablet Take 1 tablet (25 mg total) by mouth daily. 02/15/15   Jaclyn Shaggy, MD  methocarbamol (ROBAXIN) 500 MG tablet Take 1 tablet (500 mg total) by mouth 2 (two) times daily. 04/20/15   Hope Orlene Och, NP  nicotine (NICODERM CQ - DOSED IN MG/24 HOURS) 14 mg/24hr patch Place 1 patch (14 mg total) onto the skin daily. Patient not taking: Reported on 03/14/2015 02/15/15   Jaclyn Shaggy, MD   BP 138/97 mmHg  Pulse 78  Temp(Src) 98.7 F (37.1 C) (Oral)  Resp 16  SpO2 100% Physical Exam  Constitutional: He is oriented to person, place, and time. He appears well-developed and well-nourished. No distress.  HENT:  Head: Normocephalic and atraumatic.  Eyes: Conjunctivae and EOM are normal.  Neck: Normal range of motion. Neck supple.  Cardiovascular: Normal rate and regular rhythm.   Pulmonary/Chest: Effort normal and breath sounds normal. No  respiratory distress. He has no wheezes. He has no rales.  Tender on palpation left posterior ribs. Skin intact, no ecchymosis noted.   Abdominal: Soft. There is no tenderness.  Musculoskeletal: Normal range of motion.  Tender in the posterior lower left rib area. Equal grip strength bilaterally.   Neurological: He is alert and oriented to person, place, and time.  Skin: Skin is warm and dry.  Psychiatric: He has a normal mood and affect. His behavior is normal.  Nursing note and vitals reviewed.   ED Course  Procedures (including critical care time) DIAGNOSTIC STUDIES: Oxygen Saturation is 100% on RA, nl by my interpretation.    COORDINATION OF CARE: 8:48 PM Discussed treatment plan with pt at bedside which includes UA, left rib xray and pt agreed to plan.    Labs Review Labs Reviewed  URINALYSIS, ROUTINE W REFLEX MICROSCOPIC (NOT AT [facility])    Imaging Review Dg Ribs Unilateral W/chest Left  04/20/2015  CLINICAL DATA:  Acute left rib pain after fall 5 days ago. EXAM: LEFT RIBS AND CHEST - 3+ VIEW COMPARISON:  February 08, 2011. FINDINGS: No fracture or other bone lesions are seen involving the ribs. There is no evidence of pneumothorax or pleural effusion. Both lungs are clear. Heart size and mediastinal contours are within normal limits. IMPRESSION: Normal left ribs.  No acute cardiopulmonary abnormality seen. Electronically Signed   By: Lupita Raider, M.D.   On: 04/20/2015 21:38    MDM  45 y.o. male with pain to the left posterior rib area s/p injury 2 days ago stable for d/c without rib fracture or pneumothorax and no hematuria. Will treat for pain and muscle spasm and he will return as needed for worsening symptoms.   Final diagnoses:  Contusion of ribs, left, initial encounter   I personally performed the services described in this documentation, which was scribed in my presence. The recorded information has been reviewed and is accurate.   [place], [place] 04/20/15  2334  Richardean Canal, MD 04/20/15 [phone]

## 2015-04-20 NOTE — ED Notes (Addendum)
The patient fell back on to the tip of a dresser and it hit the left side of his back.  The patient said he has been taking ibuprofen and it is not working. He said it  happened sunday and it has gotten worse instead of better.  The patient rates his pain 8/10.  Denies any urinary symptoms, no blood in his urine.

## 2015-04-20 NOTE — Discharge Instructions (Signed)
Your urine is negative for blood, your x-ray shows no fracture. Take the medication as directed. Return as needed for worsening symptoms.

## 2015-04-21 MED FILL — METHOCARBAMOL 500 MG TABLET: 500 | 10 days supply | Qty: 20 | Fill #0

## 2015-04-21 MED FILL — DICLOFENAC SOD EC 50 MG TAB: 50 | 8 days supply | Qty: 15 | Fill #0

## 2018-01-03 ENCOUNTER — Inpatient Hospital Stay (HOSPITAL_COMMUNITY): Payer: Self-pay

## 2018-01-03 ENCOUNTER — Inpatient Hospital Stay (HOSPITAL_COMMUNITY)
Admission: EM | Admit: 2018-01-03 | Discharge: 2018-01-13 | DRG: 246 | Disposition: A | Discharge status: Home Health | Payer: Self-pay | Attending: Cardiology | Admitting: Cardiology

## 2018-01-03 ENCOUNTER — Emergency Department (HOSPITAL_COMMUNITY): Payer: Self-pay

## 2018-01-03 ENCOUNTER — Inpatient Hospital Stay (HOSPITAL_COMMUNITY)
Admission: EM | Disposition: A | Discharge status: Home Health | Payer: Self-pay | Source: Home / Self Care | Attending: Cardiology

## 2018-01-03 DIAGNOSIS — E876 Hypokalemia: Secondary | ICD-10-CM | POA: Diagnosis present

## 2018-01-03 DIAGNOSIS — Z955 Presence of coronary angioplasty implant and graft: Secondary | ICD-10-CM | POA: Insufficient documentation

## 2018-01-03 DIAGNOSIS — J9602 Acute respiratory failure with hypercapnia: Secondary | ICD-10-CM

## 2018-01-03 DIAGNOSIS — R402212 Coma scale, best verbal response, none, at arrival to emergency department: Secondary | ICD-10-CM | POA: Diagnosis present

## 2018-01-03 DIAGNOSIS — R0689 Other abnormalities of breathing: Secondary | ICD-10-CM

## 2018-01-03 DIAGNOSIS — G931 Anoxic brain damage, not elsewhere classified: Secondary | ICD-10-CM | POA: Diagnosis present

## 2018-01-03 DIAGNOSIS — Z4659 Encounter for fitting and adjustment of other gastrointestinal appliance and device: Secondary | ICD-10-CM

## 2018-01-03 DIAGNOSIS — Z23 Encounter for immunization: Secondary | ICD-10-CM

## 2018-01-03 DIAGNOSIS — J9600 Acute respiratory failure, unspecified whether with hypoxia or hypercapnia: Secondary | ICD-10-CM | POA: Diagnosis present

## 2018-01-03 DIAGNOSIS — J9690 Respiratory failure, unspecified, unspecified whether with hypoxia or hypercapnia: Secondary | ICD-10-CM

## 2018-01-03 DIAGNOSIS — R402112 Coma scale, eyes open, never, at arrival to emergency department: Secondary | ICD-10-CM | POA: Diagnosis present

## 2018-01-03 DIAGNOSIS — I2510 Atherosclerotic heart disease of native coronary artery without angina pectoris: Secondary | ICD-10-CM | POA: Diagnosis present

## 2018-01-03 DIAGNOSIS — F10239 Alcohol dependence with withdrawal, unspecified: Secondary | ICD-10-CM | POA: Diagnosis not present

## 2018-01-03 DIAGNOSIS — Z9289 Personal history of other medical treatment: Secondary | ICD-10-CM

## 2018-01-03 DIAGNOSIS — I472 Ventricular tachycardia: Secondary | ICD-10-CM | POA: Diagnosis present

## 2018-01-03 DIAGNOSIS — E873 Alkalosis: Secondary | ICD-10-CM | POA: Diagnosis not present

## 2018-01-03 DIAGNOSIS — Z79899 Other long term (current) drug therapy: Secondary | ICD-10-CM

## 2018-01-03 DIAGNOSIS — Z789 Other specified health status: Secondary | ICD-10-CM

## 2018-01-03 DIAGNOSIS — I10 Essential (primary) hypertension: Secondary | ICD-10-CM | POA: Diagnosis present

## 2018-01-03 DIAGNOSIS — I2102 ST elevation (STEMI) myocardial infarction involving left anterior descending coronary artery: Secondary | ICD-10-CM

## 2018-01-03 DIAGNOSIS — E872 Acidosis: Secondary | ICD-10-CM | POA: Diagnosis present

## 2018-01-03 DIAGNOSIS — R001 Bradycardia, unspecified: Secondary | ICD-10-CM | POA: Diagnosis not present

## 2018-01-03 DIAGNOSIS — E162 Hypoglycemia, unspecified: Secondary | ICD-10-CM | POA: Diagnosis not present

## 2018-01-03 DIAGNOSIS — G9340 Encephalopathy, unspecified: Secondary | ICD-10-CM

## 2018-01-03 DIAGNOSIS — F17200 Nicotine dependence, unspecified, uncomplicated: Secondary | ICD-10-CM | POA: Diagnosis present

## 2018-01-03 DIAGNOSIS — F1423 Cocaine dependence with withdrawal: Secondary | ICD-10-CM | POA: Diagnosis not present

## 2018-01-03 DIAGNOSIS — D649 Anemia, unspecified: Secondary | ICD-10-CM | POA: Diagnosis present

## 2018-01-03 DIAGNOSIS — I213 ST elevation (STEMI) myocardial infarction of unspecified site: Secondary | ICD-10-CM

## 2018-01-03 DIAGNOSIS — Z978 Presence of other specified devices: Secondary | ICD-10-CM

## 2018-01-03 DIAGNOSIS — R402312 Coma scale, best motor response, none, at arrival to emergency department: Secondary | ICD-10-CM | POA: Diagnosis present

## 2018-01-03 DIAGNOSIS — F1410 Cocaine abuse, uncomplicated: Secondary | ICD-10-CM | POA: Diagnosis present

## 2018-01-03 DIAGNOSIS — I462 Cardiac arrest due to underlying cardiac condition: Secondary | ICD-10-CM

## 2018-01-03 DIAGNOSIS — R570 Cardiogenic shock: Secondary | ICD-10-CM | POA: Diagnosis present

## 2018-01-03 DIAGNOSIS — J9601 Acute respiratory failure with hypoxia: Secondary | ICD-10-CM

## 2018-01-03 DIAGNOSIS — I2109 ST elevation (STEMI) myocardial infarction involving other coronary artery of anterior wall: Principal | ICD-10-CM | POA: Diagnosis present

## 2018-01-03 DIAGNOSIS — Z791 Long term (current) use of non-steroidal anti-inflammatories (NSAID): Secondary | ICD-10-CM

## 2018-01-03 DIAGNOSIS — J690 Pneumonitis due to inhalation of food and vomit: Secondary | ICD-10-CM | POA: Diagnosis not present

## 2018-01-03 DIAGNOSIS — Z8249 Family history of ischemic heart disease and other diseases of the circulatory system: Secondary | ICD-10-CM

## 2018-01-03 DIAGNOSIS — I4901 Ventricular fibrillation: Secondary | ICD-10-CM

## 2018-01-03 HISTORY — PX: CORONARY/GRAFT ACUTE MI REVASCULARIZATION: CATH118305

## 2018-01-03 HISTORY — DX: Tobacco use: Z72.0

## 2018-01-03 HISTORY — DX: Hyperlipidemia, unspecified: E78.5

## 2018-01-03 HISTORY — DX: Atherosclerotic heart disease of native coronary artery without angina pectoris: I25.10

## 2018-01-03 HISTORY — DX: Cocaine abuse, uncomplicated: F14.10

## 2018-01-03 HISTORY — DX: Alcohol abuse, uncomplicated: F10.10

## 2018-01-03 HISTORY — PX: RIGHT/LEFT HEART CATH AND CORONARY ANGIOGRAPHY: CATH118266

## 2018-01-03 LAB — RAPID URINE DRUG SCREEN, HOSP PERFORMED
Amphetamines: NOT DETECTED
BARBITURATES: NOT DETECTED
BENZODIAZEPINES: POSITIVE — AB
COCAINE: POSITIVE — AB
Opiates: NOT DETECTED
Tetrahydrocannabinol: POSITIVE — AB

## 2018-01-03 LAB — POCT I-STAT, CHEM 8
BUN: 12 mg/dL (ref 6–20)
BUN: 13 mg/dL (ref 6–20)
BUN: 12 mg/dL (ref 6–20)
BUN: 15 mg/dL (ref 6–20)
BUN: 13 mg/dL (ref 6–20)
CALCIUM ION: 1.09 mmol/L — AB (ref 1.15–1.40)
CALCIUM ION: 1.12 mmol/L — AB (ref 1.15–1.40)
CALCIUM ION: 1.03 mmol/L — AB (ref 1.15–1.40)
CHLORIDE: 107 mmol/L (ref 98–111)
Calcium, Ion: 0.99 mmol/L — ABNORMAL LOW (ref 1.15–1.40)
Calcium, Ion: 1.02 mmol/L — ABNORMAL LOW (ref 1.15–1.40)
Chloride: 105 mmol/L (ref 98–111)
Chloride: 106 mmol/L (ref 98–111)
Chloride: 111 mmol/L (ref 98–111)
Chloride: 108 mmol/L (ref 98–111)
Creatinine, Ser: 1.1 mg/dL (ref 0.61–1.24)
Creatinine, Ser: 1.4 mg/dL — ABNORMAL HIGH (ref 0.61–1.24)
Creatinine, Ser: 0.7 mg/dL (ref 0.61–1.24)
Creatinine, Ser: 0.9 mg/dL (ref 0.61–1.24)
Creatinine, Ser: 0.6 mg/dL — ABNORMAL LOW (ref 0.61–1.24)
GLUCOSE: 189 mg/dL — AB (ref 70–99)
GLUCOSE: 112 mg/dL — AB (ref 70–99)
Glucose, Bld: 138 mg/dL — ABNORMAL HIGH (ref 70–99)
Glucose, Bld: 110 mg/dL — ABNORMAL HIGH (ref 70–99)
Glucose, Bld: 132 mg/dL — ABNORMAL HIGH (ref 70–99)
HCT: 44 % (ref 39.0–52.0)
HCT: 47 % (ref 39.0–52.0)
HCT: 44 % (ref 39.0–52.0)
HEMATOCRIT: 40 % (ref 39.0–52.0)
HEMATOCRIT: 43 % (ref 39.0–52.0)
HEMOGLOBIN: 13.6 g/dL (ref 13.0–17.0)
HEMOGLOBIN: 15 g/dL (ref 13.0–17.0)
HEMOGLOBIN: 14.6 g/dL (ref 13.0–17.0)
Hemoglobin: 16 g/dL (ref 13.0–17.0)
Hemoglobin: 15 g/dL (ref 13.0–17.0)
POTASSIUM: 3.4 mmol/L — AB (ref 3.5–5.1)
POTASSIUM: 3.6 mmol/L (ref 3.5–5.1)
Potassium: 3.2 mmol/L — ABNORMAL LOW (ref 3.5–5.1)
Potassium: 4.5 mmol/L (ref 3.5–5.1)
Potassium: 4.1 mmol/L (ref 3.5–5.1)
SODIUM: 142 mmol/L (ref 135–145)
SODIUM: 147 mmol/L — AB (ref 135–145)
SODIUM: 143 mmol/L (ref 135–145)
Sodium: 140 mmol/L (ref 135–145)
Sodium: 147 mmol/L — ABNORMAL HIGH (ref 135–145)
TCO2: 19 mmol/L — ABNORMAL LOW (ref 22–32)
TCO2: 20 mmol/L — ABNORMAL LOW (ref 22–32)
TCO2: 21 mmol/L — AB (ref 22–32)
TCO2: 31 mmol/L (ref 22–32)
TCO2: 22 mmol/L (ref 22–32)

## 2018-01-03 LAB — BASIC METABOLIC PANEL
ANION GAP: 10 (ref 5–15)
ANION GAP: 10 (ref 5–15)
ANION GAP: 8 (ref 5–15)
ANION GAP: 15 (ref 5–15)
Anion gap: 16 — ABNORMAL HIGH (ref 5–15)
BUN: 11 mg/dL (ref 6–20)
BUN: 11 mg/dL (ref 6–20)
BUN: 12 mg/dL (ref 6–20)
BUN: 12 mg/dL (ref 6–20)
BUN: 13 mg/dL (ref 6–20)
CALCIUM: 8.6 mg/dL — AB (ref 8.9–10.3)
CALCIUM: 8.4 mg/dL — AB (ref 8.9–10.3)
CHLORIDE: 106 mmol/L (ref 98–111)
CO2: 19 mmol/L — ABNORMAL LOW (ref 22–32)
CO2: 22 mmol/L (ref 22–32)
CO2: 23 mmol/L (ref 22–32)
CO2: 25 mmol/L (ref 22–32)
CO2: 20 mmol/L — ABNORMAL LOW (ref 22–32)
CREATININE: 1.59 mg/dL — AB (ref 0.61–1.24)
Calcium: 8 mg/dL — ABNORMAL LOW (ref 8.9–10.3)
Calcium: 8.1 mg/dL — ABNORMAL LOW (ref 8.9–10.3)
Calcium: 8.2 mg/dL — ABNORMAL LOW (ref 8.9–10.3)
Chloride: 109 mmol/L (ref 98–111)
Chloride: 109 mmol/L (ref 98–111)
Chloride: 109 mmol/L (ref 98–111)
Chloride: 106 mmol/L (ref 98–111)
Creatinine, Ser: 1.2 mg/dL (ref 0.61–1.24)
Creatinine, Ser: 1.23 mg/dL (ref 0.61–1.24)
Creatinine, Ser: 1.24 mg/dL (ref 0.61–1.24)
Creatinine, Ser: 1.18 mg/dL (ref 0.61–1.24)
GFR calc Af Amer: 58 mL/min — ABNORMAL LOW (ref >60)
GFR calc Af Amer: >60 mL/min (ref >60)
GFR calc Af Amer: >60 mL/min (ref >60)
GFR calc non Af Amer: 50 mL/min — ABNORMAL LOW (ref >60)
GLUCOSE: 126 mg/dL — AB (ref 70–99)
GLUCOSE: 126 mg/dL — AB (ref 70–99)
GLUCOSE: 131 mg/dL — AB (ref 70–99)
GLUCOSE: 107 mg/dL — AB (ref 70–99)
Glucose, Bld: 243 mg/dL — ABNORMAL HIGH (ref 70–99)
POTASSIUM: 4.1 mmol/L (ref 3.5–5.1)
POTASSIUM: 4.1 mmol/L (ref 3.5–5.1)
POTASSIUM: 4.3 mmol/L (ref 3.5–5.1)
POTASSIUM: 4.2 mmol/L (ref 3.5–5.1)
Potassium: 2.6 mmol/L — CL (ref 3.5–5.1)
Sodium: 141 mmol/L (ref 135–145)
Sodium: 141 mmol/L (ref 135–145)
Sodium: 142 mmol/L (ref 135–145)
Sodium: 142 mmol/L (ref 135–145)
Sodium: 141 mmol/L (ref 135–145)

## 2018-01-03 LAB — POCT I-STAT 3, ART BLOOD GAS (G3+)
Acid-base deficit: 10 mmol/L — ABNORMAL HIGH (ref 0.0–2.0)
Acid-base deficit: 11 mmol/L — ABNORMAL HIGH (ref 0.0–2.0)
Acid-base deficit: 1 mmol/L (ref 0.0–2.0)
Acid-base deficit: 3 mmol/L — ABNORMAL HIGH (ref 0.0–2.0)
BICARBONATE: 16.6 mmol/L — AB (ref 20.0–28.0)
BICARBONATE: 17.7 mmol/L — AB (ref 20.0–28.0)
BICARBONATE: 22.5 mmol/L (ref 20.0–28.0)
Bicarbonate: 23.9 mmol/L (ref 20.0–28.0)
O2 SAT: 100 %
O2 Saturation: 100 %
O2 Saturation: 100 %
O2 Saturation: 79 %
PCO2 ART: 46.2 mmHg (ref 32.0–48.0)
PCO2 ART: 41 mmHg (ref 32.0–48.0)
PH ART: 7.182 — AB (ref 7.350–7.450)
PH ART: 7.192 — AB (ref 7.350–7.450)
PO2 ART: 351 mmHg — AB (ref 83.0–108.0)
PO2 ART: 371 mmHg — AB (ref 83.0–108.0)
PO2 ART: 47 mmHg — AB (ref 83.0–108.0)
TCO2: 18 mmol/L — ABNORMAL LOW (ref 22–32)
TCO2: 19 mmol/L — ABNORMAL LOW (ref 22–32)
TCO2: 24 mmol/L (ref 22–32)
TCO2: 25 mmol/L (ref 22–32)
pCO2 arterial: 44.2 mmHg (ref 32.0–48.0)
pCO2 arterial: 42.7 mmHg (ref 32.0–48.0)
pH, Arterial: 7.33 — ABNORMAL LOW (ref 7.350–7.450)
pH, Arterial: 7.373 (ref 7.350–7.450)
pO2, Arterial: 368 mmHg — ABNORMAL HIGH (ref 83.0–108.0)

## 2018-01-03 LAB — CBC
HEMATOCRIT: 49.2 % (ref 39.0–52.0)
HEMATOCRIT: 50.1 % (ref 39.0–52.0)
HEMOGLOBIN: 15.9 g/dL (ref 13.0–17.0)
Hemoglobin: 15.3 g/dL (ref 13.0–17.0)
MCH: 29.1 pg (ref 26.0–34.0)
MCH: 28.6 pg (ref 26.0–34.0)
MCHC: 31.1 g/dL (ref 30.0–36.0)
MCHC: 31.7 g/dL (ref 30.0–36.0)
MCV: 93.5 fL (ref 80.0–100.0)
MCV: 90.1 fL (ref 80.0–100.0)
NRBC: 0 % (ref 0.0–0.2)
PLATELETS: 216 10*3/uL (ref 150–400)
Platelets: 227 10*3/uL (ref 150–400)
RBC: 5.26 MIL/uL (ref 4.22–5.81)
RBC: 5.56 MIL/uL (ref 4.22–5.81)
RDW: 12.3 % (ref 11.5–15.5)
RDW: 12.4 % (ref 11.5–15.5)
WBC: 11.8 10*3/uL — AB (ref 4.0–10.5)
WBC: 9 10*3/uL (ref 4.0–10.5)
nRBC: 0.3 % — ABNORMAL HIGH (ref 0.0–0.2)

## 2018-01-03 LAB — POCT I-STAT TROPONIN I: TROPONIN I, POC: 0.39 ng/mL — AB (ref 0.00–0.08)

## 2018-01-03 LAB — CG4 I-STAT (LACTIC ACID): LACTIC ACID, VENOUS: 10.67 mmol/L — AB (ref 0.5–1.9)

## 2018-01-03 LAB — URINALYSIS, ROUTINE W REFLEX MICROSCOPIC
Bilirubin Urine: NEGATIVE
Glucose, UA: NEGATIVE mg/dL
Ketones, ur: NEGATIVE mg/dL
Leukocytes, UA: NEGATIVE
Nitrite: NEGATIVE
PH: 5 (ref 5.0–8.0)
Protein, ur: 30 mg/dL — AB
SPECIFIC GRAVITY, URINE: 1.045 — AB (ref 1.005–1.030)

## 2018-01-03 LAB — APTT
APTT: 53 s — AB (ref 24–36)
aPTT: 32 seconds (ref 24–36)

## 2018-01-03 LAB — PROTIME-INR
INR: 1.09
INR: 1.49
PROTHROMBIN TIME: 14 s (ref 11.4–15.2)
PROTHROMBIN TIME: 17.9 s — AB (ref 11.4–15.2)

## 2018-01-03 LAB — TROPONIN I
Troponin I: 6.82 ng/mL (ref <0.03)
Troponin I: 7.13 ng/mL (ref <0.03)
Troponin I: 11.07 ng/mL (ref <0.03)

## 2018-01-03 LAB — GLUCOSE, CAPILLARY
Glucose-Capillary: 89 mg/dL (ref 70–99)
Glucose-Capillary: 108 mg/dL — ABNORMAL HIGH (ref 70–99)
Glucose-Capillary: 100 mg/dL — ABNORMAL HIGH (ref 70–99)
Glucose-Capillary: 127 mg/dL — ABNORMAL HIGH (ref 70–99)
Glucose-Capillary: 123 mg/dL — ABNORMAL HIGH (ref 70–99)

## 2018-01-03 LAB — MRSA PCR SCREENING: MRSA by PCR: NEGATIVE

## 2018-01-03 LAB — TRIGLYCERIDES: TRIGLYCERIDES: 161 mg/dL — AB (ref <150)

## 2018-01-03 LAB — POCT ACTIVATED CLOTTING TIME: ACTIVATED CLOTTING TIME: 538 s

## 2018-01-03 LAB — LACTIC ACID, PLASMA: LACTIC ACID, VENOUS: 3.3 mmol/L — AB (ref 0.5–1.9)

## 2018-01-03 SURGERY — CORONARY/GRAFT ACUTE MI REVASCULARIZATION
Anesthesia: LOCAL

## 2018-01-03 MED ORDER — SODIUM CHLORIDE 0.9 % IV SOLN
INTRAVENOUS | Status: AC | PRN
  Administered 2018-01-03: 0.75 ug/kg/min via INTRAVENOUS

## 2018-01-03 MED ORDER — CANGRELOR BOLUS VIA INFUSION
INTRAVENOUS | Status: DC | PRN
  Administered 2018-01-03: 2859 ug via INTRAVENOUS

## 2018-01-03 MED ORDER — HEPARIN SODIUM (PORCINE) 5000 UNIT/ML IJ SOLN
4000.0000 [IU] | Freq: Once | INTRAMUSCULAR | Status: AC
  Administered 2018-01-03: 4000 [IU] via INTRAVENOUS

## 2018-01-03 MED ORDER — HEPARIN SODIUM (PORCINE) 5000 UNIT/ML IJ SOLN
5000.0000 [IU] | Freq: Three times a day (TID) | INTRAMUSCULAR | Status: DC
  Administered 2018-01-03 – 2018-01-12 (×26): 5000 [IU] via SUBCUTANEOUS
  Filled 2018-01-03 (×26): qty 1

## 2018-01-03 MED ORDER — SODIUM CHLORIDE 0.9 % IV SOLN
INTRAVENOUS | Status: DC
  Administered 2018-01-03 – 2018-01-04 (×2): via INTRAVENOUS

## 2018-01-03 MED ORDER — LIDOCAINE HCL (PF) 1 % IJ SOLN
INTRAMUSCULAR | Status: DC | PRN
  Administered 2018-01-03: 5 mL via INTRADERMAL
  Administered 2018-01-03: 2 mL via INTRADERMAL

## 2018-01-03 MED ORDER — NOREPINEPHRINE BITARTRATE 1 MG/ML IV SOLN
INTRAVENOUS | Status: AC | PRN
  Administered 2018-01-03: 10 ug/kg/min via INTRAVENOUS

## 2018-01-03 MED ORDER — SODIUM CHLORIDE 0.9 % IV SOLN
INTRAVENOUS | Status: AC | PRN
  Administered 2018-01-03: 10 mL/h via INTRAVENOUS

## 2018-01-03 MED ORDER — ASPIRIN 325 MG PO TABS
ORAL_TABLET | ORAL | Status: AC
  Filled 2018-01-03: qty 1

## 2018-01-03 MED ORDER — FENTANYL CITRATE (PF) 100 MCG/2ML IJ SOLN
100.0000 ug | Freq: Once | INTRAMUSCULAR | Status: AC
  Administered 2018-01-03: 100 ug via INTRAVENOUS
  Filled 2018-01-03: qty 2

## 2018-01-03 MED ORDER — MIDAZOLAM HCL 2 MG/2ML IJ SOLN
INTRAMUSCULAR | Status: DC | PRN
  Administered 2018-01-03 (×3): 2 mg via INTRAVENOUS

## 2018-01-03 MED ORDER — SODIUM CHLORIDE 0.9 % IV SOLN
250.0000 mL | INTRAVENOUS | Status: DC | PRN
  Administered 2018-01-03: 250 mL via INTRAVENOUS

## 2018-01-03 MED ORDER — ASPIRIN 81 MG PO CHEW
81.0000 mg | CHEWABLE_TABLET | Freq: Every day | ORAL | Status: DC
  Administered 2018-01-04 – 2018-01-05 (×2): 81 mg via ORAL
  Filled 2018-01-03 (×2): qty 1

## 2018-01-03 MED ORDER — HEPARIN (PORCINE) IN NACL 1000-0.9 UT/500ML-% IV SOLN
INTRAVENOUS | Status: AC
  Filled 2018-01-03: qty 500

## 2018-01-03 MED ORDER — SODIUM BICARBONATE 8.4 % IV SOLN
INTRAVENOUS | Status: AC
  Filled 2018-01-03: qty 50

## 2018-01-03 MED ORDER — PROPOFOL 1000 MG/100ML IV EMUL
5.0000 ug/kg/min | INTRAVENOUS | Status: DC
  Administered 2018-01-03: 20 ug/kg/min via INTRAVENOUS
  Administered 2018-01-04 (×3): 50 ug/kg/min via INTRAVENOUS
  Administered 2018-01-04: 40 ug/kg/min via INTRAVENOUS
  Administered 2018-01-04: 50 ug/kg/min via INTRAVENOUS
  Administered 2018-01-04: 40 ug/kg/min via INTRAVENOUS
  Administered 2018-01-05: 50 ug/kg/min via INTRAVENOUS
  Administered 2018-01-05: 40 ug/kg/min via INTRAVENOUS
  Administered 2018-01-05 (×2): 50 ug/kg/min via INTRAVENOUS
  Filled 2018-01-03 (×10): qty 100

## 2018-01-03 MED ORDER — SODIUM CHLORIDE 0.9 % IV SOLN
3.0000 g | Freq: Four times a day (QID) | INTRAVENOUS | Status: AC
  Administered 2018-01-03 – 2018-01-08 (×21): 3 g via INTRAVENOUS
  Filled 2018-01-03 (×22): qty 3

## 2018-01-03 MED ORDER — NITROGLYCERIN 1 MG/10 ML FOR IR/CATH LAB
INTRA_ARTERIAL | Status: DC | PRN
  Administered 2018-01-03: 200 ug via INTRACORONARY

## 2018-01-03 MED ORDER — SODIUM CHLORIDE 0.9 % IV SOLN
INTRAVENOUS | Status: AC | PRN
  Administered 2018-01-03: 1.75 mg/kg/h via INTRAVENOUS

## 2018-01-03 MED ORDER — SODIUM CHLORIDE 0.9% FLUSH
3.0000 mL | Freq: Two times a day (BID) | INTRAVENOUS | Status: DC
  Administered 2018-01-04: 10 mL via INTRAVENOUS
  Administered 2018-01-04 – 2018-01-11 (×9): 3 mL via INTRAVENOUS

## 2018-01-03 MED ORDER — SODIUM CHLORIDE 0.9 % IV SOLN
1.0000 ug/kg/min | INTRAVENOUS | Status: DC
  Administered 2018-01-03: 1 ug/kg/min via INTRAVENOUS
  Administered 2018-01-04: 1.5 ug/kg/min via INTRAVENOUS
  Filled 2018-01-03 (×3): qty 20

## 2018-01-03 MED ORDER — CISATRACURIUM BOLUS VIA INFUSION
0.0500 mg/kg | INTRAVENOUS | Status: DC | PRN
  Filled 2018-01-03 (×2): qty 5

## 2018-01-03 MED ORDER — ASPIRIN 300 MG RE SUPP
300.0000 mg | RECTAL | Status: DC
  Filled 2018-01-03: qty 1

## 2018-01-03 MED ORDER — SODIUM CHLORIDE 0.9 % IV SOLN: INTRAVENOUS | Status: DC

## 2018-01-03 MED ORDER — MIDAZOLAM HCL 2 MG/2ML IJ SOLN
2.0000 mg | Freq: Once | INTRAMUSCULAR | Status: AC
  Administered 2018-01-03: 2 mg via INTRAVENOUS
  Filled 2018-01-03: qty 2

## 2018-01-03 MED ORDER — ONDANSETRON HCL 4 MG/2ML IJ SOLN: 4.0000 mg | Freq: Four times a day (QID) | INTRAMUSCULAR | Status: DC | PRN

## 2018-01-03 MED ORDER — ACETAMINOPHEN 325 MG PO TABS
650.0000 mg | ORAL_TABLET | ORAL | Status: DC | PRN
  Administered 2018-01-05 – 2018-01-11 (×8): 650 mg via ORAL
  Filled 2018-01-03 (×8): qty 2

## 2018-01-03 MED ORDER — HEPARIN (PORCINE) IN NACL 1000-0.9 UT/500ML-% IV SOLN
INTRAVENOUS | Status: AC
  Filled 2018-01-03: qty 1000

## 2018-01-03 MED ORDER — NICOTINE 14 MG/24HR TD PT24
14.0000 mg | MEDICATED_PATCH | Freq: Every day | TRANSDERMAL | Status: DC
  Administered 2018-01-04 – 2018-01-12 (×8): 14 mg via TRANSDERMAL
  Filled 2018-01-03 (×8): qty 1

## 2018-01-03 MED ORDER — MIDAZOLAM HCL 2 MG/2ML IJ SOLN
INTRAMUSCULAR | Status: AC
  Filled 2018-01-03: qty 2

## 2018-01-03 MED ORDER — ORAL CARE MOUTH RINSE
15.0000 mL | OROMUCOSAL | Status: DC
  Administered 2018-01-03 – 2018-01-08 (×46): 15 mL via OROMUCOSAL

## 2018-01-03 MED ORDER — NOREPINEPHRINE 4 MG/250ML-% IV SOLN
0.0000 ug/min | INTRAVENOUS | Status: DC
  Administered 2018-01-03: 5 ug/min via INTRAVENOUS

## 2018-01-03 MED ORDER — NITROGLYCERIN 1 MG/10 ML FOR IR/CATH LAB
INTRA_ARTERIAL | Status: AC
  Filled 2018-01-03: qty 10

## 2018-01-03 MED ORDER — FAMOTIDINE IN NACL 20-0.9 MG/50ML-% IV SOLN
20.0000 mg | Freq: Two times a day (BID) | INTRAVENOUS | Status: DC
  Administered 2018-01-04 – 2018-01-08 (×9): 20 mg via INTRAVENOUS
  Filled 2018-01-03 (×10): qty 50

## 2018-01-03 MED ORDER — SODIUM CHLORIDE 0.9 % IV SOLN
INTRAVENOUS | Status: AC | PRN
  Administered 2018-01-03: 10 mL/h

## 2018-01-03 MED ORDER — VERAPAMIL HCL 2.5 MG/ML IV SOLN
INTRAVENOUS | Status: AC
  Filled 2018-01-03: qty 2

## 2018-01-03 MED ORDER — TICAGRELOR 90 MG PO TABS
ORAL_TABLET | ORAL | Status: AC
  Filled 2018-01-03: qty 1

## 2018-01-03 MED ORDER — SODIUM CHLORIDE 0.9 % IV SOLN
2.0000 mg/h | INTRAVENOUS | Status: DC
  Administered 2018-01-03: 7 mg/h via INTRAVENOUS
  Administered 2018-01-03: 2 mg/h via INTRAVENOUS
  Administered 2018-01-04: 8 mg/h via INTRAVENOUS
  Administered 2018-01-04 (×2): 7 mg/h via INTRAVENOUS
  Administered 2018-01-04 – 2018-01-05 (×2): 8 mg/h via INTRAVENOUS
  Filled 2018-01-03 (×9): qty 10

## 2018-01-03 MED ORDER — BIVALIRUDIN BOLUS VIA INFUSION - CUPID
INTRAVENOUS | Status: DC | PRN
  Administered 2018-01-03: 71.475 mg via INTRAVENOUS

## 2018-01-03 MED ORDER — ASPIRIN 81 MG PO CHEW
CHEWABLE_TABLET | ORAL | Status: DC | PRN
  Administered 2018-01-03: 324 mg via NASOGASTRIC

## 2018-01-03 MED ORDER — BIVALIRUDIN TRIFLUOROACETATE 250 MG IV SOLR
INTRAVENOUS | Status: AC
  Filled 2018-01-03: qty 250

## 2018-01-03 MED ORDER — SODIUM CHLORIDE 0.9 % IV SOLN
INTRAVENOUS | Status: AC
  Administered 2018-01-03: 19:00:00 via INTRAVENOUS

## 2018-01-03 MED ORDER — ETOMIDATE 2 MG/ML IV SOLN
INTRAVENOUS | Status: DC | PRN
  Administered 2018-01-03: 30 mg via INTRAVENOUS

## 2018-01-03 MED ORDER — NOREPINEPHRINE 4 MG/250ML-% IV SOLN
0.0000 ug/min | INTRAVENOUS | Status: DC
  Administered 2018-01-04: 4 ug/min via INTRAVENOUS
  Administered 2018-01-04: 14 ug/min via INTRAVENOUS
  Administered 2018-01-04: 10 ug/min via INTRAVENOUS
  Administered 2018-01-05: 9 ug/min via INTRAVENOUS
  Administered 2018-01-05: 20 ug/min via INTRAVENOUS
  Administered 2018-01-05 (×2): 17 ug/min via INTRAVENOUS
  Administered 2018-01-06: 7 ug/min via INTRAVENOUS
  Filled 2018-01-03 (×8): qty 250

## 2018-01-03 MED ORDER — SODIUM BICARBONATE 8.4 % IV SOLN
INTRAVENOUS | Status: DC | PRN
  Administered 2018-01-03 (×2): 100 meq via INTRAVENOUS

## 2018-01-03 MED ORDER — MIDAZOLAM BOLUS VIA INFUSION
2.0000 mg | INTRAVENOUS | Status: DC | PRN
  Filled 2018-01-03: qty 2

## 2018-01-03 MED ORDER — SODIUM CHLORIDE 0.9% FLUSH: 3.0000 mL | INTRAVENOUS | Status: DC | PRN

## 2018-01-03 MED ORDER — CISATRACURIUM BOLUS VIA INFUSION
0.1000 mg/kg | Freq: Once | INTRAVENOUS | Status: AC
  Administered 2018-01-03: 9.5 mg via INTRAVENOUS
  Filled 2018-01-03: qty 10

## 2018-01-03 MED ORDER — ARTIFICIAL TEARS OPHTHALMIC OINT
1.0000 "application " | TOPICAL_OINTMENT | Freq: Three times a day (TID) | OPHTHALMIC | Status: DC
  Administered 2018-01-03 – 2018-01-06 (×9): 1 via OPHTHALMIC
  Filled 2018-01-03 (×2): qty 3.5

## 2018-01-03 MED ORDER — TICAGRELOR 90 MG PO TABS
ORAL_TABLET | ORAL | Status: DC | PRN
  Administered 2018-01-03: 180 mg via NASOGASTRIC

## 2018-01-03 MED ORDER — CANGRELOR TETRASODIUM 50 MG IV SOLR
INTRAVENOUS | Status: AC
  Filled 2018-01-03: qty 50

## 2018-01-03 MED ORDER — CHLORHEXIDINE GLUCONATE 0.12% ORAL RINSE (MEDLINE KIT)
15.0000 mL | Freq: Two times a day (BID) | OROMUCOSAL | Status: DC
  Administered 2018-01-03 – 2018-01-08 (×10): 15 mL via OROMUCOSAL

## 2018-01-03 MED ORDER — FENTANYL CITRATE (PF) 100 MCG/2ML IJ SOLN
INTRAMUSCULAR | Status: AC
  Filled 2018-01-03: qty 2

## 2018-01-03 MED ORDER — VERAPAMIL HCL 2.5 MG/ML IV SOLN
INTRAVENOUS | Status: DC | PRN
  Administered 2018-01-03: 10 mL via INTRA_ARTERIAL

## 2018-01-03 MED ORDER — FENTANYL CITRATE (PF) 100 MCG/2ML IJ SOLN
INTRAMUSCULAR | Status: DC | PRN
  Administered 2018-01-03: 50 ug via INTRAVENOUS
  Administered 2018-01-03 (×2): 25 ug via INTRAVENOUS

## 2018-01-03 MED ORDER — NOREPINEPHRINE 4 MG/250ML-% IV SOLN
INTRAVENOUS | Status: AC
  Filled 2018-01-03: qty 250

## 2018-01-03 MED ORDER — LIDOCAINE HCL (PF) 1 % IJ SOLN
INTRAMUSCULAR | Status: AC
  Filled 2018-01-03: qty 30

## 2018-01-03 MED ORDER — SUCCINYLCHOLINE CHLORIDE 20 MG/ML IJ SOLN
INTRAMUSCULAR | Status: DC | PRN
  Administered 2018-01-03: 120 mg via INTRAVENOUS

## 2018-01-03 MED ORDER — HEPARIN SODIUM (PORCINE) 5000 UNIT/ML IJ SOLN
INTRAMUSCULAR | Status: AC
  Filled 2018-01-03: qty 1

## 2018-01-03 MED ORDER — ASPIRIN 300 MG RE SUPP: 300.0000 mg | RECTAL | Status: AC

## 2018-01-03 MED ORDER — PROPOFOL 1000 MG/100ML IV EMUL
INTRAVENOUS | Status: AC
  Filled 2018-01-03: qty 100

## 2018-01-03 MED ORDER — TICAGRELOR 90 MG PO TABS
90.0000 mg | ORAL_TABLET | Freq: Two times a day (BID) | ORAL | Status: DC
  Administered 2018-01-04 – 2018-01-05 (×4): 90 mg via ORAL
  Filled 2018-01-03 (×4): qty 1

## 2018-01-03 MED ORDER — FENTANYL 2500MCG IN NS 250ML (10MCG/ML) PREMIX INFUSION
100.0000 ug/h | INTRAVENOUS | Status: DC
  Administered 2018-01-03: 200 ug/h via INTRAVENOUS
  Administered 2018-01-05 – 2018-01-06 (×3): 300 ug/h via INTRAVENOUS
  Filled 2018-01-03 (×7): qty 250

## 2018-01-03 MED ORDER — FENTANYL BOLUS VIA INFUSION
50.0000 ug | INTRAVENOUS | Status: DC | PRN
  Filled 2018-01-03: qty 50

## 2018-01-03 SURGICAL SUPPLY — 28 items
BALLN SAPPHIRE 2.0X12 (BALLOONS) ×2
BALLN SAPPHIRE 2.5X15 (BALLOONS) ×2
BALLN ~~LOC~~ EMERGE MR 3.5X15 (BALLOONS) ×2
BALLOON SAPPHIRE 2.0X12 (BALLOONS) ×1 IMPLANT
BALLOON SAPPHIRE 2.5X15 (BALLOONS) ×1 IMPLANT
BALLOON ~~LOC~~ EMERGE MR 3.5X15 (BALLOONS) ×1 IMPLANT
CATH INFINITI 5FR ANG PIGTAIL (CATHETERS) ×2 IMPLANT
CATH OPTITORQUE TIG 4.0 5F (CATHETERS) ×2 IMPLANT
CATH SWAN GANZ 7F STRAIGHT (CATHETERS) ×2 IMPLANT
CATH VISTA GUIDE 6FR XBLAD3.5 (CATHETERS) ×2 IMPLANT
DEVICE RAD COMP TR BAND LRG (VASCULAR PRODUCTS) ×2 IMPLANT
GLIDESHEATH SLEND A-KIT 6F 22G (SHEATH) ×2 IMPLANT
GLIDESHEATH SLEND SS 6F .021 (SHEATH) IMPLANT
GUIDEWIRE INQWIRE 1.5J.035X260 (WIRE) ×1 IMPLANT
HOVERMATT SINGLE USE (MISCELLANEOUS) ×2 IMPLANT
INQWIRE 1.5J .035X260CM (WIRE) ×2
KIT ENCORE 26 ADVANTAGE (KITS) ×2 IMPLANT
KIT HEART LEFT (KITS) ×2 IMPLANT
PACK CARDIAC CATHETERIZATION (CUSTOM PROCEDURE TRAY) ×2 IMPLANT
SHEATH PINNACLE 6F 10CM (SHEATH) IMPLANT
SHEATH PINNACLE 7F 10CM (SHEATH) ×2 IMPLANT
SLEEVE REPOSITIONING LENGTH 30 (MISCELLANEOUS) ×2 IMPLANT
STENT SYNERGY DES 3X28 (Permanent Stent) ×2 IMPLANT
SYR MEDRAD MARK V 150ML (SYRINGE) ×2 IMPLANT
TRANSDUCER W/STOPCOCK (MISCELLANEOUS) ×2 IMPLANT
TUBING CIL FLEX 10 FLL-RA (TUBING) ×2 IMPLANT
WIRE ASAHI PROWATER 180CM (WIRE) ×2 IMPLANT
WIRE HI TORQ BMW 190CM (WIRE) ×2 IMPLANT

## 2018-01-03 NOTE — Progress Notes (Signed)
Pharmacy Antibiotic Note  Rodney Lloyd is a 47 y.o. male admitted on 01/03/2018 with VT arrest. Pharmacy consulted to dose unasyn for possible aspiration PNA. -WBC= 9, hypothermic, SCr= 1.1  Plan: -Unasyn 3gm IV q6h -Will follow renal function, cultures and clinical progress      Temp (24hrs), Avg:94.9 F (34.9 C), Min:94.9 F (34.9 C), Max:94.9 F (34.9 C)  Recent Labs  Lab 01/03/18 1310 01/03/18 1318 01/03/18 1339 01/03/18 1403 01/03/18 1624  WBC 11.8*  --   --   --  9.0  CREATININE 1.59*  --  1.40* 1.10  --   LATICACIDVEN  --  10.67*  --   --   --     CrCl cannot be calculated (Unknown ideal weight.).    No Known Allergies  Harland German, PharmD Clinical Pharmacist **Pharmacist phone directory can now be found on [email or web address] ([email or web address]).  Listed under [facility].

## 2018-01-03 NOTE — Code Documentation (Addendum)
Patient arrived by [facility] following CPR after witnessed arrest by family. Patient had defib x 4 and received EPI x 6 pta. Arrived with IO Left tib. Epi drip infusing on arrival. Has been paced intermittently prior to arrival-HR 30-40S. 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 EMS discontinued the EPI drip on patient arrival to ED

## 2018-01-03 NOTE — Progress Notes (Signed)
Pt transported to 2H from cath lab w/ no apparent complications.  Unit RT given report

## 2018-01-03 NOTE — ED Notes (Addendum)
ICE PACKS applied for cooling

## 2018-01-03 NOTE — Progress Notes (Signed)
Pt's ETT advanced 2cm per MD order.  Secured 26@lip . Critical ABG results reported to Dr.Harding in cath lab.  Order to increase RR at th is time.

## 2018-01-03 NOTE — ED Notes (Signed)
Cardiology at bedside for STEMI activation.

## 2018-01-03 NOTE — Progress Notes (Signed)
Pt to 2H19 from CCL @ approx 1630. Ice packs in place at that time. [place] placed at 1650.

## 2018-01-03 NOTE — Progress Notes (Signed)
Received pt from ED RT, pt currently in cath lab already intubated while in ED.  Sat 100%.

## 2018-01-03 NOTE — Procedures (Signed)
Arterial Catheter Insertion Procedure Note Rodney Lloyd [id number] 1970-10-01  Procedure: Insertion of Arterial Catheter  Indications: Frequent blood sampling  Procedure Details Consent: Risks of procedure as well as the alternatives and risks of each were explained to the (patient/caregiver).  Consent for procedure obtained. Time Out: Verified patient identification, verified procedure, site/side was marked, verified correct patient position, special equipment/implants available, medications/allergies/relevent history reviewed, required imaging and test results available.  Performed  Maximum sterile technique was used including antiseptics. Skin prep: Chlorhexidine; local anesthetic administered 20 gauge catheter was inserted into left radial artery using the Seldinger technique. ULTRASOUND GUIDANCE USED: YES Evaluation Blood flow good; BP tracing good. Complications: No apparent complications.   [name] 01/03/2018

## 2018-01-03 NOTE — ED Provider Notes (Signed)
[facility] CARDIAC CATH LAB Provider Note   CSN: [id number] Arrival date & time: 01/03/18  1241     History   Chief Complaint No chief complaint on file.   HPI Rodney Lloyd is a 47 y.o. male.  Patient is a 47 year old male with a history of tobacco abuse, hypertension, cocaine abuse who presents today by EMS after a V. fib arrest.  Patient apparently was complaining of chest pain earlier today when he collapsed in his hotel room.  Significant other witnessed the collapse and started CPR immediately.  When police arrived they continued CPR.  When EMS arrived patient was in V. fib and received 3 shocks with return of spontaneous circulation.  He was given 4 rounds of epi as well.  Right before he was loaded on the truck he coded of second time and was given a fourth shock with return of circulation.  Patient is currently on an epi drip through an IO.  He has a Scientist, clinical (histocompatibility and immunogenetics) airway in place.  He has not had any return of consciousness.  The history is provided by the EMS personnel. The history is limited by the condition of the patient.    Past Medical History:  Diagnosis Date  . Hypertension     Patient Active Problem List   Diagnosis Date Noted  . Cardiac arrest due to underlying cardiac condition (HCC) 01/03/2018  . Acute ST elevation myocardial infarction (STEMI) of anterolateral wall (HCC) 01/03/2018  . Cocaine abuse (HCC) 01/03/2018  . Cardiac arrest (HCC) 01/03/2018  . Essential hypertension 02/15/2015  . Eye swelling or mass 02/15/2015  . Tobacco abuse 02/15/2015    No past surgical history on file.      Home Medications    Prior to Admission medications   Medication Sig Start Date End Date Taking? Authorizing Provider  acetaminophen (TYLENOL) 325 MG tablet Take 650 mg by mouth every 6 (six) hours as needed for mild pain.     [provider]  cephALEXin (KEFLEX) 500 MG capsule Take 1 capsule (500 mg total) by mouth 4 (four) times daily.  03/14/15   Pricilla Loveless, MD  diclofenac (VOLTAREN) 50 MG EC tablet Take 1 tablet (50 mg total) by mouth 2 (two) times daily. 04/20/15   Janne Napoleon, NP  hydrochlorothiazide (HYDRODIURIL) 25 MG tablet Take 1 tablet (25 mg total) by mouth daily. 02/15/15   Hoy Register, MD  methocarbamol (ROBAXIN) 500 MG tablet Take 1 tablet (500 mg total) by mouth 2 (two) times daily. 04/20/15   Janne Napoleon, NP  nicotine (NICODERM CQ - DOSED IN MG/24 HOURS) 14 mg/24hr patch Place 1 patch (14 mg total) onto the skin daily. Patient not taking: Reported on 03/14/2015 02/15/15   Hoy Register, MD    Family History Family History  Problem Relation Age of Onset  . Hyperlipidemia Mother   . Heart disease Father     Social History Social History   Tobacco Use  . Smoking status: Current Every Day Smoker  Substance Use Topics  . Alcohol use: Yes  . Drug use: No     Allergies   Patient has no known allergies.   Review of Systems Review of Systems  Unable to perform ROS: Patient unresponsive     Physical Exam Updated Vital Signs BP 122/78 (BP Location: Right Arm)   Pulse (!) 122   Temp (!) 94.9 F (34.9 C) (Temporal)   Resp 14   SpO2 100%   Physical Exam  Constitutional:  He appears well-developed and well-nourished. No distress.  HENT:  Head: Normocephalic and atraumatic.  King airway in place.  Voluminous amounts of vomitus coming out around the [place] airway  Eyes: Conjunctivae and EOM are normal.  Pupils are 3 mm and sluggishly reactive.  Patient is occasionally blinking  Neck: Normal range of motion. Neck supple.  Cardiovascular: Regular rhythm and intact distal pulses. Tachycardia present.  No murmur heard. Pulmonary/Chest: Effort normal and breath sounds normal. No respiratory distress. He has no wheezes. He has no rales.  Abdominal: Soft. He exhibits no distension. There is no tenderness. There is no rebound and no guarding.  Musculoskeletal: Normal range of motion. He  exhibits no edema or tenderness.  I/O present in the right lower extremity  Neurological: He is unresponsive. GCS eye subscore is 1. GCS verbal subscore is 1. GCS motor subscore is 1.  Intermittent blinking but no purposeful movement.  No withdrawal from pain  Skin: Skin is warm and dry. No rash noted. No erythema.  Psychiatric:  Unresponsive  Nursing note and vitals reviewed.    ED Treatments / Results  Labs (all labs ordered are listed, but only abnormal results are displayed) Labs Reviewed  BASIC METABOLIC PANEL - Abnormal; Notable for the following components:      Result Value   Potassium 2.6 (*)    CO2 19 (*)    Glucose, Bld 243 (*)    Creatinine, Ser 1.59 (*)    Calcium 8.6 (*)    GFR calc non Af Amer 50 (*)    GFR calc Af Amer 58 (*)    Anion gap 16 (*)    All other components within normal limits  CBC - Abnormal; Notable for the following components:   WBC 11.8 (*)    nRBC 0.3 (*)    All other components within normal limits  CG4 I-STAT (LACTIC ACID) - Abnormal; Notable for the following components:   Lactic Acid, Venous 10.67 (*)    All other components within normal limits  POCT I-STAT TROPONIN I - Abnormal; Notable for the following components:   Troponin i, poc 0.39 (*)    All other components within normal limits  APTT  PROTIME-INR  BLOOD GAS, ARTERIAL  TRIGLYCERIDES  TROPONIN I  TROPONIN I  TROPONIN I  BASIC METABOLIC PANEL  BASIC METABOLIC PANEL  BASIC METABOLIC PANEL  BASIC METABOLIC PANEL  BASIC METABOLIC PANEL  BASIC METABOLIC PANEL  BASIC METABOLIC PANEL  PROTIME-INR  PROTIME-INR  APTT  APTT  BLOOD GAS, ARTERIAL  BLOOD GAS, ARTERIAL  LACTIC ACID, PLASMA  I-STAT CG4 LACTIC ACID, ED  I-STAT TROPONIN, ED  CBG MONITORING, ED    EKG EKG Interpretation  Date/Time:  Saturday January 03 2018 12:41:55 EDT Ventricular Rate:  98 PR Interval:    QRS Duration: 92 QT Interval:  391 QTC Calculation: 500 R Axis:   81 Text Interpretation:   Ventricular-paced complexes No further rhythm analysis attempted due to paced rhythm Probable left atrial enlargement Anterior infarct, acute (LAD) ST elevation, consider inferior injury Lateral leads are also involved No previous tracing Confirmed by Gwyneth Sprout ([id number]) on 01/03/2018 3:40:42 PM   Radiology Dg Chest Port 1 View  Result Date: 01/03/2018 CLINICAL DATA:  Post cardiac arrest and CPR. EXAM: PORTABLE CHEST 1 VIEW COMPARISON:  04/20/2015 FINDINGS: Endotracheal tube terminates 4.9 cm superior to the carina, advancement with approximately 2 cm may be considered. Cardiomediastinal silhouette is normal. Mediastinal contours appear intact. There is no evidence of focal airspace  consolidation, pleural effusion or pneumothorax. Osseous structures are without acute abnormality. Soft tissues are grossly normal. IMPRESSION: Endotracheal tube terminates 4.9 cm superior to the carina, advancement with approximately 2 cm may be considered. Otherwise no acute findings within the thorax. Electronically Signed   By: Ted Mcalpine M.D.   On: 01/03/2018 13:51    Procedures Procedure Name: Intubation Date/Time: 01/03/2018 3:55 PM Performed by: Gwyneth Sprout, MD Pre-anesthesia Checklist: Patient identified, Patient being monitored, Emergency Drugs available, Timeout performed and Suction available Oxygen Delivery Method: Non-rebreather mask Preoxygenation: Pre-oxygenation with 100% oxygen Induction Type: Rapid sequence Ventilation: Mask ventilation without difficulty Laryngoscope Size: Glidescope and 4 Tube size: 7.5 mm Number of attempts: 1 Placement Confirmation: ETT inserted through vocal cords under direct vision,  CO2 detector and Breath sounds checked- equal and bilateral Secured at: 24 cm Tube secured with: ETT holder Difficulty Due To: Difficulty was unanticipated      (including critical care time)  Medications Ordered in ED Medications  etomidate (AMIDATE) injection (30  mg Intravenous Given 01/03/18 1247)  heparin 5000 UNIT/ML injection (has no administration in time range)  norepinephrine (LEVOPHED) 4mg  in D5W premix infusion ( Intravenous MAR Hold 01/03/18 1312)  norepinephrine (LEVOPHED) 4-5 MG/250ML-% infusion SOLN (has no administration in time range)  aspirin suppository 300 mg ( Rectal MAR Hold 01/03/18 1312)  0.9 %  sodium chloride infusion (has no administration in time range)  propofol (DIPRIVAN) 1000 MG/100ML infusion (has no administration in time range)  fentaNYL (SUBLIMAZE) injection (25 mcg Intravenous Given 01/03/18 1509)  midazolam (VERSED) injection (2 mg Intravenous Given 01/03/18 1510)  lidocaine (PF) (XYLOCAINE) 1 % injection (5 mLs Intradermal Given 01/03/18 1332)  Radial Cocktail/Verapamil only (10 mLs Intra-arterial Given 01/03/18 1328)  0.9 %  sodium chloride infusion (10 mL/hr  New Bag/Given 01/03/18 1330)  norepinephrine (LEVOPHED) 4 mg in dextrose 5 % 250 mL (0.016 mg/mL) infusion (2.5 mcg/min Intravenous Rate/Dose Change 01/03/18 1444)  cangrelor (KENGREAL) bolus via infusion (2,859 mcg Intravenous Given 01/03/18 1343)  cangrelor (KENGREAL) 50,000 mcg in sodium chloride 0.9 % 250 mL (200 mcg/mL) infusion (0.75 mcg/kg/min  95.3 kg (Order-Specific) Intravenous New Bag/Given 01/03/18 1345)  norepinephrine (LEVOPHED) 4mg  in D5W premix infusion (has no administration in time range)  aspirin suppository 300 mg (has no administration in time range)  cisatracurium (NIMBEX) bolus via infusion 0.1 mg/kg (has no administration in time range)    And  cisatracurium (NIMBEX) 200 mg in sodium chloride 0.9 % 200 mL (1 mg/mL) infusion (has no administration in time range)    And  cisatracurium (NIMBEX) bolus via infusion 0.05 mg/kg (has no administration in time range)  artificial tears (LACRILUBE) ophthalmic ointment 1 application (has no administration in time range)  0.9 %  sodium chloride infusion (has no administration in time range)    fentaNYL (SUBLIMAZE) injection 100 mcg (has no administration in time range)  fentaNYL in NS (47mcg/ml) infusion-PREMIX (has no administration in time range)  fentaNYL (SUBLIMAZE) bolus via infusion 50 mcg (has no administration in time range)  midazolam (VERSED) injection 2 mg (has no administration in time range)  midazolam (VERSED) 50 mg in sodium chloride 0.9 % 50 mL (1 mg/mL) infusion (has no administration in time range)  midazolam (VERSED) bolus via infusion 2 mg (has no administration in time range)  famotidine (PEPCID) IVPB 20 mg premix (has no administration in time range)  sodium bicarbonate injection (100 mEq Intravenous Given 01/03/18 1425)  nitroGLYCERIN 1 mg/10 mL (100 mcg/mL) -  IR/CATH LAB (200 mcg Intracoronary Given 01/03/18 1415)  bivalirudin (ANGIOMAX) BOLUS via infusion (71.475 mg Intravenous Given 01/03/18 1338)  bivalirudin (ANGIOMAX) 250 mg in sodium chloride 0.9 % 50 mL (5 mg/mL) infusion (1.75 mg/kg/hr  95.3 kg (Order-Specific) Intravenous New Bag/Given 01/03/18 1340)  ticagrelor (BRILINTA) tablet (180 mg Per NG tube Given 01/03/18 1505)  aspirin chewable tablet (324 mg Per NG tube Given 01/03/18 1506)  nicotine (NICODERM CQ - dosed in mg/24 hours) patch 14 mg (has no administration in time range)  heparin injection 4,000 Units (4,000 Units Intravenous Given 01/03/18 1252)     Initial Impression / Assessment and Plan / ED Course  I have reviewed the triage vital signs and the nursing notes.  Pertinent labs & imaging results that were available during my care of the patient were reviewed by me and considered in my medical decision making (see chart for details).     Patient is a 47 year old V. fib arrest with evidence of inferior lateral and anterior infarct on EKG.  Patient received 4 shocks total and 4 rounds of epi and was on epi drip when he arrived.  Patient's heart rate was in the 120s with blood pressure in the 130s upon arrival.  He had no signs of  meaningful response.  Pupils were reactive.  Patient was intubated for airway protection.  Cardiology was present and code STEMI was initiated.  Patient was also made a code cool.  He was given a heparin bolus.  Epi drip was stopped.  Patient was taken to the Cath Lab.  Patient will be admitted to the ICU.  CRITICAL CARE Performed by: Braxton Weisbecker Total critical care time: 30 minutes Critical care time was exclusive of separately billable procedures and treating other patients. Critical care was necessary to treat or prevent imminent or life-threatening deterioration. Critical care was time spent personally by me on the following activities: development of treatment plan with patient and/or surrogate as well as nursing, discussions with consultants, evaluation of patient's response to treatment, examination of patient, obtaining history from patient or surrogate, ordering and performing treatments and interventions, ordering and review of laboratory studies, ordering and review of radiographic studies, pulse oximetry and re-evaluation of patient's condition.   Final Clinical Impressions(s) / ED Diagnoses   Final diagnoses:  Cardiopulmonary resuscitation (CPR)-only resuscitation status  Ventricular fibrillation (HCC)  ST elevation myocardial infarction (STEMI), unspecified artery [facility])    ED Discharge Orders         Ordered    AMB Referral to Cardiac Rehabilitation - Phase II     01/03/18 1514           Gwyneth Sprout, MD 01/03/18 1556

## 2018-01-03 NOTE — Consult Note (Signed)
NAME:  Rodney Lloyd, MRN:  [id number], DOB:  21-Dec-1970, LOS: 0 ADMISSION DATE:  01/03/2018, CONSULTATION DATE:  01/03/2018 REFERRING MD:  EDP - [place], CHIEF COMPLAINT:  Cardiac arrest   Brief History   47 year old male with history of HTN who presents to PCCM in the ED after a VT arrest outside the hospital.  Bystander CPR was started and EMS arrived.  Total of 4 rounds of epi and 4 shocks delivered.  With ROSC patient was in sinus tach and was noted to have ST segment elevation and code STEMI was called.  Upon arrival, patient was sedated and intubated and was being taken to the cath lab for emergent cath.  No further history available at this time.  Past Medical History  HTN  Significant Hospital Events   11/2>>>STEMI to cath lab post arrest  Consults: date of consult/date signed off & final recs:  PCCM 11/2>>>  Procedures (surgical and bedside):  Cardiac cath 11/2>>>  Significant Diagnostic Tests:  Cardiac cath 11/2>>>  Micro Data:  N/A  Antimicrobials:  N/A   Subjective:  Sedated and intubated  Objective   Blood pressure 122/78, pulse (!) 122, temperature (!) 94.9 F (34.9 C), temperature source Temporal, resp. rate 14, SpO2 99 %.       No intake or output data in the 24 hours ending 01/03/18 1326 There were no vitals filed for this visit.  Examination: General: Acutely ill appearing male, sedated and intubated HENT: [place]/AT, PERRL, EOM-spontaneous and MMM Lungs: CTA bilaterally Cardiovascular: Soft, NT, ND and +BS Abdomen: Soft, NT, ND and +BS Extremities: -edema and -tenderness Neuro: Sedated, intubated and paralyzed Skin: intact  Resolved Hospital Problem list   N/A  I reviewed CXR myself, ETT is in a good position.  Assessment & Plan:  47 year old male with HTN history presenting to PCCM for vent management post VT arrest from STEMI.  Patient is being taken emergently to the cath lab.  Discussed with PCCM-NP.  VT cardiac arrest:  - Emergent cath  -  Hypothermia protocol  - Head CT to r/o bleed  - ?IABP, will defer to cards  - Hold home anti-HTN  Acute respiratory failure:  - Full vent support  - ABG now and adjust vent for ABG  - Titrate O2 for sat of 92-95%  - Move ETT down 2 cm  - CXR and ABG in AM  - No need for abx at this time, no evidence of infection  Cardiogenic shock:  - Volume resuscitation  - Levophed  Anoxic encephalopathy:  - Head CT  Sedation:  - Versed drip  - Fentanyl drip  - Nimbex drip  Disposition / Summary of Today's Plan 01/03/18     Labs   CBC: No results for input(s): WBC, NEUTROABS, HGB, HCT, MCV, PLT in the last 168 hours.  Basic Metabolic Panel: No results for input(s): NA, K, CL, CO2, GLUCOSE, BUN, CREATININE, CALCIUM, MG, PHOS in the last 168 hours. GFR: CrCl cannot be calculated (Patient's most recent lab result is older than the maximum 21 days allowed.). Recent Labs  Lab 01/03/18 1318  LATICACIDVEN 10.67*    Liver Function Tests: No results for input(s): AST, ALT, ALKPHOS, BILITOT, PROT, ALBUMIN in the last 168 hours. No results for input(s): LIPASE, AMYLASE in the last 168 hours. No results for input(s): AMMONIA in the last 168 hours.  ABG No results found for: PHART, PCO2ART, PO2ART, HCO3, TCO2, ACIDBASEDEF, O2SAT   Coagulation Profile: No results for  input(s): INR, PROTIME in the last 168 hours.  Cardiac Enzymes: No results for input(s): CKTOTAL, CKMB, CKMBINDEX, TROPONINI in the last 168 hours.  HbA1C: No results found for: HGBA1C  CBG: No results for input(s): GLUCAP in the last 168 hours.  Admitting History of Present Illness.   47 year old male with history of HTN who presents to PCCM in the ED after a VT arrest outside the hospital.  Bystander CPR was started and EMS arrived.  Total of 4 rounds of epi and 4 shocks delivered.  With ROSC patient was in sinus tach and was noted to have ST segment elevation and code STEMI was called.  Upon arrival, patient was  sedated and intubated and was being taken to the cath lab for emergent cath.  No further history available at this time.   Review of Systems:   Unattainable  Past Medical History  He,  has a past medical history of Hypertension.   Surgical History   No past surgical history on file.   Social History   Social History   Socioeconomic History  . Marital status: Divorced    Spouse name: Not on file  . Number of children: Not on file  . Years of education: Not on file  . Highest education level: Not on file  Occupational History  . Not on file  Social Needs  . Financial resource strain: Not on file  . Food insecurity:    Worry: Not on file    Inability: Not on file  . Transportation needs:    Medical: Not on file    Non-medical: Not on file  Tobacco Use  . Smoking status: Current Every Day Smoker  Substance and Sexual Activity  . Alcohol use: Yes  . Drug use: No  . Sexual activity: Not on file  Lifestyle  . Physical activity:    Days per week: Not on file    Minutes per session: Not on file  . Stress: Not on file  Relationships  . Social connections:    Talks on phone: Not on file    Gets together: Not on file    Attends religious service: Not on file    Active member of club or organization: Not on file    Attends meetings of clubs or organizations: Not on file    Relationship status: Not on file  . Intimate partner violence:    Fear of current or ex partner: Not on file    Emotionally abused: Not on file    Physically abused: Not on file    Forced sexual activity: Not on file  Other Topics Concern  . Not on file  Social History Narrative  . Not on file  ,  reports that he has been smoking. He does not have any smokeless tobacco history on file. He reports that he drinks alcohol. He reports that he does not use drugs.   Family History   His family history includes Heart disease in his father; Hyperlipidemia in his mother.   Allergies No Known Allergies    Home Medications  Prior to Admission medications   Medication Sig Start Date End Date Taking? Authorizing Provider  acetaminophen (TYLENOL) 325 MG tablet Take 650 mg by mouth every 6 (six) hours as needed for mild pain.     [provider]  cephALEXin (KEFLEX) 500 MG capsule Take 1 capsule (500 mg total) by mouth 4 (four) times daily. 03/14/15   Pricilla Loveless, MD  diclofenac (VOLTAREN) 50  MG EC tablet Take 1 tablet (50 mg total) by mouth 2 (two) times daily. 04/20/15   Janne Napoleon, NP  hydrochlorothiazide (HYDRODIURIL) 25 MG tablet Take 1 tablet (25 mg total) by mouth daily. 02/15/15   Hoy Register, MD  methocarbamol (ROBAXIN) 500 MG tablet Take 1 tablet (500 mg total) by mouth 2 (two) times daily. 04/20/15   Janne Napoleon, NP  nicotine (NICODERM CQ - DOSED IN MG/24 HOURS) 14 mg/24hr patch Place 1 patch (14 mg total) onto the skin daily. Patient not taking: Reported on 03/14/2015 02/15/15   Hoy Register, MD    The patient is critically ill with multiple organ systems failure and requires high complexity decision making for assessment and support, frequent evaluation and titration of therapies, application of advanced monitoring technologies and extensive interpretation of multiple databases.   Critical Care Time devoted to patient care services described in this note is  45  Minutes. This time reflects time of care of this signee Dr Koren Bound. This critical care time does not reflect procedure time, or teaching time or supervisory time of PA/NP/Med student/Med Resident etc but could involve care discussion time.  Alyson Reedy, M.D. [facility] Pulmonary/Critical Care Medicine. Pager: [phone]. After hours pager: [phone].

## 2018-01-03 NOTE — Progress Notes (Signed)
   01/03/18 1300  Clinical Encounter Type  Visited With Family  Visit Type ED;Pre-op  Responded to pager in the ED for Code Stemi. Family arrived and pout in Consult B. Doctor gave family update. Patient taken to Cath lab.Family escorted to cath lab waiting room.  Provided the ministry of presence and spiritual support to the family.

## 2018-01-03 NOTE — H&P (Addendum)
History and Physical Note:    PCP: [facility], [facility] NAME:  Rodney Lloyd   MRN: [id number] DOB:  1970/12/16   ADMIT DATE: 01/03/2018  CARDIOLOGIST: NEW - Bryan Lemma, MD  01/03/2018 3:09 PM  Rodney Lloyd is a 47 y.o. male with no prior known cardiac history who was a witnessed arrest at a hotel.  Significant other found him having fallen down started on slight CPR.  Police then took over CPR.  Upon EMS arrival tachycardia 3 stepwise shocks restored spontaneous rhythm however he then started to bradycardia down unsuccessful pacing.  He went back into the VT and required another shock.  He has had 4 rounds of epinephrine.  Upon arrival to [facility] he was in sinus rhythm sinus tachycardia with significant anterolateral and inferior ST elevations.  Code STEMI was called by EMS in route. In the ER he was intubated and OG tube was placed.  He was given 4000 IV heparin, but they were not able to get a second IV line.  He did not receive aspirin.   PD were on the scene assisting with CPR.  They indicated that there was evidence of cocaine on the scene and suspicion that he was using cocaine.   Past Medical History:  Diagnosis Date  . Hypertension   --Unable to obtain any more details on past medical history prior to taken to the Cath Lab.  By report only hypertension  No past surgical history on file.  FAMHx: Family History  Problem Relation Age of Onset  . Hyperlipidemia Mother   . Heart disease Father     SOCHx:  reports that he has been smoking. He does not have any smokeless tobacco history on file. He reports that he drinks alcohol. He reports that he does not use drugs.  Cocaine positive on the scene  ALLERGIES: No Known Allergies  HOME MEDICATIONS: Medications Prior to Admission  Medication Sig Dispense Refill Last Dose  . acetaminophen (TYLENOL) 325 MG tablet Take 650 mg by mouth every 6 (six) hours as needed for mild pain.     03/14/2015 at Unknown time  . cephALEXin (KEFLEX) 500 MG capsule Take 1 capsule (500 mg total) by mouth 4 (four) times daily. 28 capsule 0   . diclofenac (VOLTAREN) 50 MG EC tablet Take 1 tablet (50 mg total) by mouth 2 (two) times daily. 15 tablet 0   . hydrochlorothiazide (HYDRODIURIL) 25 MG tablet Take 1 tablet (25 mg total) by mouth daily. 90 tablet 3 03/14/2015 at Unknown time  . methocarbamol (ROBAXIN) 500 MG tablet Take 1 tablet (500 mg total) by mouth 2 (two) times daily. 20 tablet 0   . nicotine (NICODERM CQ - DOSED IN MG/24 HOURS) 14 mg/24hr patch Place 1 patch (14 mg total) onto the skin daily. (Patient not taking: Reported on 03/14/2015) 28 patch 1 Not Taking at Unknown time   Review of Systems  Unable to perform ROS: Intubated  Constitutional:       By report, had no complaints prior to today.   PHYSICAL EXAM:Blood pressure 122/78, pulse (!) 122, temperature (!) 94.9 F (34.9 C), temperature source Temporal, resp. rate 14, SpO2 100 %. General appearance: Intubated sedated but with purposeful movements. Neck: no carotid bruit, no JVD and thyroid not enlarged, symmetric, no tenderness/mass/nodules Lungs: For the most part clear breath sounds bilaterally with ventilator Heart: Difficult distant heart sounds but RRR, normal S1-S2.  No obvious M/R/G Abdomen: soft, non-tender; bowel  sounds normal; no masses,  no organomegaly Extremities: extremities normal, atraumatic, no cyanosis or edema Pulses: 2+ and symmetric Skin: Skin color, texture, turgor normal. No rashes or lesions Neurologic: Mental status: Intubated and sedated.  But is moving against the vent --ET tube and OG tube in place.  Right leg IO line in place   Adult ECG Report Sinus tachycardia rates in the 120s. RBBB.  Significant ~6+ mm ST elevations in V2 through V6 as well as 2 to 3 mm elevations in ruminal 2, no 3 and aVF. --Difficult to localize but likely anterior lateral and inferior ST elevation MI.  IMPRESSION &  PLAN The patients' history has been reviewed, patient examined. Principal Problem:   Acute ST elevation myocardial infarction (STEMI) of anterolateral wall (HCC) Active Problems:   Essential hypertension   Cardiac arrest due to underlying cardiac condition (HCC)   Cocaine abuse (HCC)   Rodney Lloyd has presented today for surgery, with the diagnosis of anterolateral-inferior STEMI with cardiac arrest  --emergency consent implied.  After consideration of risks, benefits and other options for treatment, the patient has consented to Procedure(s):  LEFT HEART CATHETERIZATION AND CORONARY ANGIOGRAPHY +/- AD HOC PERCUTANEOUS CORONARY INTERVENTION RIGHT HEART CATHETERIZATION  as a surgical intervention.   We will proceed with the planned procedure.  Currently on Neo-Synephrine infusion, will continue until decision is made about cooling protocol which I will defer to the PCCM team.  -->  Plan to perform right heart catheterization in order to determine extent of shock.  Since he is not in severe shock we will plan to do post PCI. RT will assist management of vent during the cath.  Suspect he will be acidotic.  We will use propofol for sedation with fentanyl and Versed boluses.  Follow troponin levels, check lipid panel.  Check A1c.  Urine toxicology  High-dose statin.  Avoid beta-blocker until confirmed plus/minus cocaine with urine toxicology.  Upon completion of cath plan to wean down pressors as possible.   Bryan Lemma, MD, MS [facility] [address] [place], [place]  [address]  [phone]  01/03/2018 3:09 PM

## 2018-01-04 ENCOUNTER — Inpatient Hospital Stay (HOSPITAL_COMMUNITY): Payer: Self-pay

## 2018-01-04 DIAGNOSIS — F1410 Cocaine abuse, uncomplicated: Secondary | ICD-10-CM

## 2018-01-04 DIAGNOSIS — I5030 Unspecified diastolic (congestive) heart failure: Secondary | ICD-10-CM

## 2018-01-04 HISTORY — PX: TRANSTHORACIC ECHOCARDIOGRAM: SHX275

## 2018-01-04 LAB — BASIC METABOLIC PANEL
ANION GAP: 6 (ref 5–15)
Anion gap: 12 (ref 5–15)
Anion gap: 8 (ref 5–15)
BUN: 14 mg/dL (ref 6–20)
BUN: 12 mg/dL (ref 6–20)
BUN: 11 mg/dL (ref 6–20)
CALCIUM: 7.7 mg/dL — AB (ref 8.9–10.3)
CHLORIDE: 112 mmol/L — AB (ref 98–111)
CO2: 19 mmol/L — AB (ref 22–32)
CO2: 21 mmol/L — ABNORMAL LOW (ref 22–32)
CO2: 20 mmol/L — AB (ref 22–32)
CREATININE: 0.99 mg/dL (ref 0.61–1.24)
Calcium: 7.8 mg/dL — ABNORMAL LOW (ref 8.9–10.3)
Calcium: 7.6 mg/dL — ABNORMAL LOW (ref 8.9–10.3)
Chloride: 107 mmol/L (ref 98–111)
Chloride: 113 mmol/L — ABNORMAL HIGH (ref 98–111)
Creatinine, Ser: 0.96 mg/dL (ref 0.61–1.24)
Creatinine, Ser: 0.83 mg/dL (ref 0.61–1.24)
GFR calc Af Amer: >60 mL/min (ref >60)
GFR calc non Af Amer: >60 mL/min (ref >60)
GFR calc non Af Amer: >60 mL/min (ref >60)
GLUCOSE: 142 mg/dL — AB (ref 70–99)
Glucose, Bld: 120 mg/dL — ABNORMAL HIGH (ref 70–99)
Glucose, Bld: 131 mg/dL — ABNORMAL HIGH (ref 70–99)
POTASSIUM: 3 mmol/L — AB (ref 3.5–5.1)
Potassium: 5 mmol/L (ref 3.5–5.1)
Potassium: 3.1 mmol/L — ABNORMAL LOW (ref 3.5–5.1)
SODIUM: 140 mmol/L (ref 135–145)
Sodium: 138 mmol/L (ref 135–145)
Sodium: 140 mmol/L (ref 135–145)

## 2018-01-04 LAB — CBC
HEMATOCRIT: 42.4 % (ref 39.0–52.0)
HEMOGLOBIN: 14.3 g/dL (ref 13.0–17.0)
MCH: 29.4 pg (ref 26.0–34.0)
MCHC: 33.7 g/dL (ref 30.0–36.0)
MCV: 87.1 fL (ref 80.0–100.0)
Platelets: 154 10*3/uL (ref 150–400)
RBC: 4.87 MIL/uL (ref 4.22–5.81)
RDW: 12.4 % (ref 11.5–15.5)
WBC: 11 10*3/uL — ABNORMAL HIGH (ref 4.0–10.5)
nRBC: 0 % (ref 0.0–0.2)

## 2018-01-04 LAB — GLUCOSE, CAPILLARY
GLUCOSE-CAPILLARY: 131 mg/dL — AB (ref 70–99)
GLUCOSE-CAPILLARY: 148 mg/dL — AB (ref 70–99)
GLUCOSE-CAPILLARY: 123 mg/dL — AB (ref 70–99)
GLUCOSE-CAPILLARY: 124 mg/dL — AB (ref 70–99)
Glucose-Capillary: 151 mg/dL — ABNORMAL HIGH (ref 70–99)
Glucose-Capillary: 125 mg/dL — ABNORMAL HIGH (ref 70–99)
Glucose-Capillary: 116 mg/dL — ABNORMAL HIGH (ref 70–99)
Glucose-Capillary: 94 mg/dL (ref 70–99)

## 2018-01-04 LAB — POCT I-STAT, CHEM 8
BUN: 14 mg/dL (ref 6–20)
BUN: 11 mg/dL (ref 6–20)
BUN: 11 mg/dL (ref 6–20)
BUN: 10 mg/dL (ref 6–20)
BUN: 9 mg/dL (ref 6–20)
CALCIUM ION: 1.01 mmol/L — AB (ref 1.15–1.40)
CHLORIDE: 109 mmol/L (ref 98–111)
CHLORIDE: 109 mmol/L (ref 98–111)
CHLORIDE: 110 mmol/L (ref 98–111)
CHLORIDE: 111 mmol/L (ref 98–111)
CREATININE: 0.8 mg/dL (ref 0.61–1.24)
CREATININE: 0.7 mg/dL (ref 0.61–1.24)
CREATININE: 0.7 mg/dL (ref 0.61–1.24)
Calcium, Ion: 1.01 mmol/L — ABNORMAL LOW (ref 1.15–1.40)
Calcium, Ion: 1.04 mmol/L — ABNORMAL LOW (ref 1.15–1.40)
Calcium, Ion: 1.02 mmol/L — ABNORMAL LOW (ref 1.15–1.40)
Calcium, Ion: 1 mmol/L — ABNORMAL LOW (ref 1.15–1.40)
Chloride: 110 mmol/L (ref 98–111)
Creatinine, Ser: 0.7 mg/dL (ref 0.61–1.24)
Creatinine, Ser: 0.7 mg/dL (ref 0.61–1.24)
GLUCOSE: 110 mg/dL — AB (ref 70–99)
GLUCOSE: 137 mg/dL — AB (ref 70–99)
Glucose, Bld: 157 mg/dL — ABNORMAL HIGH (ref 70–99)
Glucose, Bld: 124 mg/dL — ABNORMAL HIGH (ref 70–99)
Glucose, Bld: 124 mg/dL — ABNORMAL HIGH (ref 70–99)
HCT: 45 % (ref 39.0–52.0)
HCT: 38 % — ABNORMAL LOW (ref 39.0–52.0)
HCT: 38 % — ABNORMAL LOW (ref 39.0–52.0)
HCT: 37 % — ABNORMAL LOW (ref 39.0–52.0)
HEMATOCRIT: 41 % (ref 39.0–52.0)
HEMOGLOBIN: 15.3 g/dL (ref 13.0–17.0)
Hemoglobin: 13.9 g/dL (ref 13.0–17.0)
Hemoglobin: 12.9 g/dL — ABNORMAL LOW (ref 13.0–17.0)
Hemoglobin: 12.9 g/dL — ABNORMAL LOW (ref 13.0–17.0)
Hemoglobin: 12.6 g/dL — ABNORMAL LOW (ref 13.0–17.0)
POTASSIUM: 3 mmol/L — AB (ref 3.5–5.1)
POTASSIUM: 2.7 mmol/L — AB (ref 3.5–5.1)
Potassium: 5 mmol/L (ref 3.5–5.1)
Potassium: 3.3 mmol/L — ABNORMAL LOW (ref 3.5–5.1)
Potassium: 2.5 mmol/L — CL (ref 3.5–5.1)
SODIUM: 141 mmol/L (ref 135–145)
SODIUM: 145 mmol/L (ref 135–145)
SODIUM: 145 mmol/L (ref 135–145)
Sodium: 145 mmol/L (ref 135–145)
Sodium: 146 mmol/L — ABNORMAL HIGH (ref 135–145)
TCO2: 21 mmol/L — AB (ref 22–32)
TCO2: 20 mmol/L — ABNORMAL LOW (ref 22–32)
TCO2: 20 mmol/L — ABNORMAL LOW (ref 22–32)
TCO2: 19 mmol/L — ABNORMAL LOW (ref 22–32)
TCO2: 18 mmol/L — ABNORMAL LOW (ref 22–32)

## 2018-01-04 LAB — PROTIME-INR
INR: 1.19
Prothrombin Time: 15 seconds (ref 11.4–15.2)

## 2018-01-04 LAB — TROPONIN I
Troponin I: 13.06 ng/mL (ref <0.03)
Troponin I: 11.19 ng/mL (ref <0.03)

## 2018-01-04 LAB — COOXEMETRY PANEL
Carboxyhemoglobin: 0.9 % (ref 0.5–1.5)
METHEMOGLOBIN: 1.4 % (ref 0.0–1.5)
O2 SAT: 89.1 %
TOTAL HEMOGLOBIN: 11 g/dL — AB (ref 12.0–16.0)

## 2018-01-04 LAB — ECHOCARDIOGRAM COMPLETE: WEIGHTICAEL: 3396.85 [oz_av]

## 2018-01-04 LAB — APTT: APTT: 39 s — AB (ref 24–36)

## 2018-01-04 LAB — PHOSPHORUS
PHOSPHORUS: 2 mg/dL — AB (ref 2.5–4.6)
Phosphorus: <1 mg/dL — CL (ref 2.5–4.6)

## 2018-01-04 LAB — MAGNESIUM: Magnesium: 1.7 mg/dL (ref 1.7–2.4)

## 2018-01-04 MED ORDER — MAGNESIUM SULFATE 2 GM/50ML IV SOLN
2.0000 g | Freq: Once | INTRAVENOUS | Status: AC
  Administered 2018-01-04: 2 g via INTRAVENOUS
  Filled 2018-01-04: qty 50

## 2018-01-04 MED ORDER — POTASSIUM CHLORIDE 10 MEQ/50ML IV SOLN
10.0000 meq | INTRAVENOUS | Status: AC
  Administered 2018-01-04 (×4): 10 meq via INTRAVENOUS
  Filled 2018-01-04 (×4): qty 50

## 2018-01-04 MED ORDER — POTASSIUM CHLORIDE 10 MEQ/50ML IV SOLN
10.0000 meq | INTRAVENOUS | Status: DC
  Administered 2018-01-04 (×5): 10 meq via INTRAVENOUS
  Filled 2018-01-04 (×8): qty 50

## 2018-01-04 MED ORDER — INSULIN ASPART 100 UNIT/ML ~~LOC~~ SOLN
2.0000 [IU] | SUBCUTANEOUS | Status: DC
  Administered 2018-01-04 – 2018-01-05 (×7): 2 [IU] via SUBCUTANEOUS

## 2018-01-04 MED ORDER — SODIUM PHOSPHATES 45 MMOLE/15ML IV SOLN
30.0000 mmol | Freq: Once | INTRAVENOUS | Status: AC
  Administered 2018-01-04: 30 mmol via INTRAVENOUS
  Filled 2018-01-04: qty 10

## 2018-01-04 NOTE — Progress Notes (Signed)
  Echocardiogram 2D Echocardiogram has been performed.  Leta Jungling M 01/04/2018, 10:44 AM

## 2018-01-04 NOTE — Progress Notes (Signed)
Patient transported from 2H19 to CT and back with no complications.

## 2018-01-04 NOTE — Procedures (Addendum)
History: 47 year old male status post cardiac arrest on hypothermia  Sedation: Propofol, Versed, fentanyl  Technique: This is a 21 channel routine scalp EEG performed at the bedside with bipolar and monopolar montages arranged in accordance to the international 10/20 system of electrode placement. One channel was dedicated to EKG recording.    Background: The background is discontinuous with a burst suppression pattern, with a burst consisting of non-epileptiform irregular delta and theta activity interspersed with some alpha as well.   Photic stimulation: Physiologic driving is not performed  EEG Abnormalities: 1) burst suppression pattern  Clinical Interpretation: This EEG was markedly abnormal with evidence of a severe generalized cerebral dysfunction.  Though burst suppression can be suggestive of poor prognosis in the immediate post arrest setting, given the presence of sedating medications and the lack of any epileptiform features in the bursts, I do not think that this EEG is of significance for prognosis.  If the patient continues to be encephalopathic after rewarming, would consider repeat EEG at that time.  There was no seizure or seizure predisposition recorded on this study. Please note that a normal EEG does not preclude the possibility of epilepsy.   Ritta Slot, MD [facility] [phone]  If 7pm- 7am, please page neurology on call as listed in AMION.

## 2018-01-04 NOTE — Progress Notes (Addendum)
NAME:  Rodney Lloyd, MRN:  [id number], DOB:  [birth year], LOS: 1 ADMISSION DATE:  01/03/2018, CONSULTATION DATE:  01/03/2018 REFERRING MD:  EDP - [place], CHIEF COMPLAINT:  Cardiac arrest   Brief History   47 year old male with history of HTN who presents to PCCM in the ED after a VT arrest outside the hospital.  Bystander CPR was started and EMS arrived.  Total of 4 rounds of epi and 4 shocks delivered.  With ROSC patient was in sinus tach and was noted to have ST segment elevation and code STEMI was called.  Upon arrival, patient was sedated and intubated and was being taken to the cath lab for emergent cath.  No further history available at this time.  Past Medical History  HTN  Significant Hospital Events   11/2>>>STEMI to cath lab post arrest  Consults: date of consult/date signed off & final recs:  PCCM 11/2>>>  Procedures (surgical and bedside):  Cardiac cath 11/2>>>  CULPRIT BIFURCATION LESION: Prox LAD lesion is 60% stenosed with 75% stenosed side branch in Ost 1st Diag. Prox LAD to Mid LAD lesion is 100% stenosed.  A drug-eluting stent was successfully placed (beginning proximal to diagonal branch) using a STENT SYNERGY DES 3X28. -Postdilated to 3.6 mm. Post intervention, there is a 0% residual stenosis in the LAD  Balloon angioplasty was performed on Ost 1st Diag using a BALLOON SAPPHIRE 2.0X12. Post intervention, the side branch was reduced to 20% residual stenosis.  ----------------------------------------------------------  Ost Ramus lesion is 65% stenosed.  ----------------------------------------------------------  There is mild left ventricular systolic dysfunction. The left ventricular ejection fraction is 45-50% by visual estimate.  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  Successful PCI of LAD with DES & PTCA of ost-prox 1st Diag  EF ~45% with distal Anterior-anterolateral hypokinesis.  Minimally Elevated LVEDP & PCWP with normal RHC pressures -borderline but not true cardiogenic shock  Low Normal CO/CI (with Neosynephrine off).  Acute Respiratory Failure with Acidemia  Metabolic Acidosis - treated with 2 Amp NaHCO3  RECOMMENDATIONS: Admit to CVICU -PCCM managing ventilator and possible cooling protocol  Convert from [facility] to ticagrelor -> DC Kengreal upon arrival to CCU  Maintain low-dose Neo-Synephrine for blood pressure support due to sedation  Defer decision making Re: Cooling protocol to PCCM. -->  Anticipate right heart cath removal either later on today versus tomorrow morning.  High-dose statin, hold off on beta-blocker until we see urine toxicology screen.  Recommend uninterrupted dual antiplatelet therapy with Aspirin 81mg  daily and Ticagrelor 90mg  twice daily for a minimum of 12 months (ACS - Class I recommendation).   Will review images with interventional colleagues to determine if ramus intermedius lesion should be treated prior to discharge or manage medically.  Echo 11/2>> EF 55-60%, Mild hypokinesis of the apical anterior myocardium; consistent with ischemia in the distribution of the distal left anterior descending coronary artery. Grade 1 diastolic dysfunction, no evidence of thrombus  Significant Diagnostic Tests:  Cardiac cath 11/2>>>See above  Micro Data:  11/2>> Blood 11/2>> Respiratory 11/2>> PCR negative  Antimicrobials:  Unasyn 01/03/2018  Subjective:  Sedated and intubated  Objective   Blood pressure 95/67, pulse 63, temperature (!) 90.9 F (32.7 C), temperature source  Core, resp. rate (!) 26, weight 96.3 kg, SpO2 92 %. PAP: (28-46)/(12-28) 31/19 CVP:  [8 mmHg-26 mmHg] 8 mmHg  Vent Mode: PRVC FiO2 (%):  [40 %-100 %]  40 % Set Rate:  [18 bmp-26 bmp] 26 bmp Vt Set:  [600 mL] 600 mL PEEP:  [5 cmH20] 5 cmH20 Plateau Pressure:  [20 cmH20-24 cmH20] 20 cmH20   Intake/Output Summary (Last 24 hours) at 01/04/2018 1244 Last data filed at 01/04/2018 1200 Gross per 24 hour  Intake 3733.02 ml  Output 2400 ml  Net 1333.02 ml   Filed Weights   01/03/18 1700 01/04/18 0558  Weight: 95 kg 96.3 kg    Examination: General: Acutely ill appearing male, sedated and intubated, at cooling goal since 23:16 11/2, nimbex HENT: [place]/AT, PERRL, EOM-spontaneous and MMM, ETT, OGT Lungs: Bilateral chest excursion, Coarse throughout Cardiovascular: Soft, NT, ND and +BS Abdomen: Soft, NT, ND and +BS Extremities: -edema and -tenderness Neuro: Sedated, intubated and paralyzed Skin: intact  Resolved Hospital Problem list   N/A  Assessment & Plan:   47 year old male with HTN history presenting to PCCM for vent management post VT arrest from STEMI.  Patient  taken emergently to the cath lab. CULPRIT BIFURCATION LESION: Prox LAD lesion is 60% stenosed with 75% stenosed side branch in Ost 1st Diag. Prox LAD to Mid LAD lesion is 100% stenosed.  A drug-eluting stent was successfully placed (beginning proximal to diagonal branch) using a STENT SYNERGY DES 3X28. -Postdilated to 3.6 mm. Post intervention, there is a 0% residual stenosis in the LAD   VT cardiac arrest:               Plan:  - Cath results as above  - Hypothermia protocol: goal temp at 11:16 11/2  - Head CT negative  - Hold home anti-HTN  Acute respiratory failure:               Plan  - Full vent support while cooling  - ABG prn  - Titrate O2 for sat of 92-95%  - CXR daily              - Continue Unasyn>> Sinusitis per CT   Cardiogenic shock: + 1.4 L since admission               Plan  - Volume resuscitation  - Continue levophed, wean as able             - Titrate for MAP > 65             - Trend lactate   Anoxic encephalopathy:  Normal head CT              Plan:             Neuro checks once warmed   Sedation:               Plan  - Versed drip  - Fentanyl drip  - Nimbex drip             - Propofol drip  Renal              - Hypo mag              - Hypo K              - Hypo phos              - Stable creatinine              -  Adequate UO                Plan               Trend BMET                Monitor UO                  Replete electrolytes prn           Disposition / Summary of Today's Plan 01/04/18   Cooling goal reached 11/2 at 23:16 Warming will start 11/3 at 23:16 Will need to consider triple lumen 11/4  For access once patient is warmed Family updated at bedside 11/3  Labs   CBC: Recent Labs  Lab 01/03/18 1310  01/03/18 1624  01/04/18 0138 01/04/18 0409 01/04/18 0634 01/04/18 0809 01/04/18 1157  WBC 11.8*  --  9.0  --   --  11.0*  --   --   --   HGB 15.3   < > 15.9   < > 15.3 14.3 13.9 12.9* 12.9*  HCT 49.2   < > 50.1   < > 45.0 42.4 41.0 38.0* 38.0*  MCV 93.5  --  90.1  --   --  87.1  --   --   --   PLT 216  --  227  --   --  154  --   --   --    < > = values in this interval not displayed.    Basic Metabolic Panel: Recent Labs  Lab 01/03/18 1310  01/03/18 1624  01/03/18 1858  01/04/18 0138 01/04/18 0409 01/04/18 0634 01/04/18 0809 01/04/18 1157  NA 141   < > 142  141  142   < > 141   < > 138  141 140 145 146* 145  K 2.6*   < > 4.1  4.3  4.1   < > 4.2   < > 5.0  5.0 3.0* 3.0* 2.7* 3.3*  CL 106   < > 109  109  109   < > 106   < > 107  109 113* 110 109 110  CO2 19*  --  23  22  25   --  20*  --  19* 21*  --   --   --   GLUCOSE 243*   < > 126*  126*  131*   < > 107*   < > 142*  157* 120* 124* 110* 137*  BUN 11   < > 12  12  11    < > 13   < > 14  14 12 11 11 10   CREATININE 1.59*   < > 1.23  1.24  1.20   < > 1.18   < > 0.96  0.80 0.83 0.70 0.70 0.70  CALCIUM 8.6*  --  8.1*  8.2*  8.0*  --  8.4*  --  7.8* 7.7*  --   --   --   MG  --   --   --   --   --   --   --  1.7  --    --   --   PHOS  --   --   --   --   --   --   --  <1.0*  --   --   --    < > = values in this interval not displayed.   GFR: CrCl cannot be calculated (  Unknown ideal weight.). Recent Labs  Lab 01/03/18 1310 01/03/18 1318 01/03/18 1624 01/04/18 0409  WBC 11.8*  --  9.0 11.0*  LATICACIDVEN  --  10.67* 3.3*  --     Liver Function Tests: No results for input(s): AST, ALT, ALKPHOS, BILITOT, PROT, ALBUMIN in the last 168 hours. No results for input(s): LIPASE, AMYLASE in the last 168 hours. No results for input(s): AMMONIA in the last 168 hours.  ABG    Component Value Date/Time   PHART 7.330 (L) 01/03/2018 1442   PCO2ART 42.7 01/03/2018 1442   PO2ART 47.0 (L) 01/03/2018 1442   HCO3 22.5 01/03/2018 1442   TCO2 19 (L) 01/04/2018 1157   ACIDBASEDEF 3.0 (H) 01/03/2018 1442   O2SAT 79.0 01/03/2018 1442     Coagulation Profile: Recent Labs  Lab 01/03/18 1310 01/03/18 1624 01/04/18 0138  INR 1.09 1.49 1.19    Cardiac Enzymes: Recent Labs  Lab 01/03/18 1624 01/03/18 1858 01/04/18 0138 01/04/18 0409  TROPONINI 7.13*  6.82* 11.07* 13.06* 11.19*    HbA1C: No results found for: HGBA1C  CBG: Recent Labs  Lab 01/04/18 0040 01/04/18 0134 01/04/18 0228 01/04/18 0409 01/04/18 0623  GLUCAP 151* 148* 131* 125* 123*    Admitting History of Present Illness.    47 year old male with history of HTN who presents to PCCM in the ED after a VT arrest outside the hospital.  Bystander CPR was started and EMS arrived.  Total of 4 rounds of epi and 4 shocks delivered.  With ROSC patient was in sinus tach and was noted to have ST segment elevation and code STEMI was called.  Upon arrival, patient was sedated and intubated and was being taken to the cath lab for emergent cath.  CULPRIT BIFURCATION LESION: Prox LAD lesion is 60% stenosed with 75% stenosed side branch in Ost 1st Diag. Prox LAD to Mid LAD lesion is 100% stenosed.  A drug-eluting stent was successfully placed (beginning  proximal to diagonal branch) using a STENT SYNERGY DES 3X28. -Postdilated to 3.6 mm. Post intervention, there is a 0% residual stenosis in the LAD.  Cooling goal temp reached 11/2/at 23:16    Review of Systems:   Unattainable  Past Medical History  He,  has a past medical history of Hypertension.   Surgical History   No past surgical history on file.   Social History   Social History   Socioeconomic History  . Marital status: Divorced    Spouse name: Not on file  . Number of children: Not on file  . Years of education: Not on file  . Highest education level: Not on file  Occupational History  . Not on file  Social Needs  . Financial resource strain: Not on file  . Food insecurity:    Worry: Not on file    Inability: Not on file  . Transportation needs:    Medical: Not on file    Non-medical: Not on file  Tobacco Use  . Smoking status: Current Every Day Smoker  Substance and Sexual Activity  . Alcohol use: Yes  . Drug use: No  . Sexual activity: Not on file  Lifestyle  . Physical activity:    Days per week: Not on file    Minutes per session: Not on file  . Stress: Not on file  Relationships  . Social connections:    Talks on phone: Not on file    Gets together: Not on file    Attends religious service: Not  on file    Active member of club or organization: Not on file    Attends meetings of clubs or organizations: Not on file    Relationship status: Not on file  . Intimate partner violence:    Fear of current or ex partner: Not on file    Emotionally abused: Not on file    Physically abused: Not on file    Forced sexual activity: Not on file  Other Topics Concern  . Not on file  Social History Narrative  . Not on file  ,  reports that he has been smoking. He does not have any smokeless tobacco history on file. He reports that he drinks alcohol. He reports that he does not use drugs.   Family History   His family history includes Heart disease in his  father; Hyperlipidemia in his mother.   Allergies No Known Allergies   Home Medications  Prior to Admission medications   Medication Sig Start Date End Date Taking? Authorizing Provider  acetaminophen (TYLENOL) 325 MG tablet Take 650 mg by mouth every 6 (six) hours as needed for mild pain.     [provider]  cephALEXin (KEFLEX) 500 MG capsule Take 1 capsule (500 mg total) by mouth 4 (four) times daily. 03/14/15   Pricilla Loveless, MD  diclofenac (VOLTAREN) 50 MG EC tablet Take 1 tablet (50 mg total) by mouth 2 (two) times daily. 04/20/15   Janne Napoleon, NP  hydrochlorothiazide (HYDRODIURIL) 25 MG tablet Take 1 tablet (25 mg total) by mouth daily. 02/15/15   Hoy Register, MD  methocarbamol (ROBAXIN) 500 MG tablet Take 1 tablet (500 mg total) by mouth 2 (two) times daily. 04/20/15   Janne Napoleon, NP  nicotine (NICODERM CQ - DOSED IN MG/24 HOURS) 14 mg/24hr patch Place 1 patch (14 mg total) onto the skin daily. Patient not taking: Reported on 03/14/2015 02/15/15   Hoy Register, MD    Bevelyn Ngo, AGACNP-BC [facility] Pager # [phone] After 3 pm call [phone]  01/04/2018 1:24 PM  Attending Note:  47 year old male s/p cardiac arrest who had a cath with LAD concerns that were addressed.  Overnight, no events.  On exam, lungs are clear.  I reviewed CXR myself, no acute disease noted.  Will continue hypothermia protocol.  Will replace electrolytes.  Levophed for BP support.  No family bedside to update.  AM labs ordered.  PCCM will continue to follow.  Adjust vent for ABG.  The patient is critically ill with multiple organ systems failure and requires high complexity decision making for assessment and support, frequent evaluation and titration of therapies, application of advanced monitoring technologies and extensive interpretation of multiple databases.   Critical Care Time devoted to patient care services described in this note is  31   Minutes. This time reflects time of care of this signee Dr Koren Bound. This critical care time does not reflect procedure time, or teaching time or supervisory time of PA/NP/Med student/Med Resident etc but could involve care discussion time.  Alyson Reedy, M.D. [facility]. Pager: [phone]. After hours pager: [phone].

## 2018-01-04 NOTE — Progress Notes (Addendum)
Fellow relayed that Dr. Herbie Baltimore suggested CHF team to see the patient. Dr. Gala Romney reviewed chart and does not feel pt needs advanced HF intervention at this time and requests general team to see first, and please call him if help needed. Munirah Doerner PA-C

## 2018-01-04 NOTE — Progress Notes (Signed)
[facility] ADULT ICU REPLACEMENT PROTOCOL FOR AM LAB REPLACEMENT ONLY  The patient does apply for the [facility] Adult ICU Electrolyte Replacment Protocol based on the criteria listed below:   1. Is GFR >/= 40 ml/min? Yes.    Patient's GFR today is >60 2. Is urine output >/= 0.5 ml/kg/hr for the last 6 hours? Yes.   Patient's UOP is 0.7 ml/kg/hr 3. Is BUN < 60 mg/dL? Yes.    Patient's BUN today is 12 4. Abnormal electrolyte(s):K 3.0, Phos <1 5. Ordered repletion with: protocol 6. If a panic level lab has been reported, has the CCM MD in charge been notified? No..   Physician:    Markus Daft A 01/04/2018 6:43 AM

## 2018-01-04 NOTE — Progress Notes (Signed)
Manually adjusted for time change 

## 2018-01-04 NOTE — Progress Notes (Signed)
Two RT's attempted A Line x2 unsuccessfully. RN is aware. CCM is aware.

## 2018-01-04 NOTE — Progress Notes (Signed)
EEG Completed; Results Pending  

## 2018-01-04 NOTE — Progress Notes (Signed)
Progress Note  Patient Name: Rodney Lloyd Date of Encounter: 01/04/2018  Primary Cardiologist: No primary care provider on file. new  Subjective   Remains intubated and sedated.  Undergoing the cooling protocol per critical care.  Inpatient Medications    Scheduled Meds: . artificial tears  1 application Both Eyes Y6T  . aspirin  81 mg Oral Daily  . chlorhexidine gluconate (MEDLINE KIT)  15 mL Mouth Rinse BID  . heparin  5,000 Units Subcutaneous Q8H  . insulin aspart  2-6 Units Subcutaneous Q4H  . mouth rinse  15 mL Mouth Rinse 10 times per day  . nicotine  14 mg Transdermal Daily  . sodium chloride flush  3 mL Intravenous Q12H  . ticagrelor  90 mg Oral BID   Continuous Infusions: . sodium chloride    . sodium chloride 100 mL/hr at 01/04/18 0702  . sodium chloride 10 mL/hr at 01/04/18 0400  . ampicillin-sulbactam (UNASYN) IV 3 g (01/04/18 0656)  . cisatracurium (NIMBEX) infusion 1.5 mcg/kg/min (01/04/18 0400)  . famotidine (PEPCID) IV    . fentaNYL infusion INTRAVENOUS 300 mcg/hr (01/04/18 0400)  . midazolam (VERSED) infusion 7 mg/hr (01/04/18 0605)  . norepinephrine (LEVOPHED) Adult infusion 8 mcg/min (01/04/18 0811)  . potassium chloride 10 mEq (01/04/18 0746)  . propofol (DIPRIVAN) infusion 40 mcg/kg/min (01/04/18 0702)  . sodium phosphate  Dextrose 5% IVPB 30 mmol (01/04/18 0855)   PRN Meds: sodium chloride, acetaminophen, [COMPLETED] cisatracurium **AND** cisatracurium (NIMBEX) infusion **AND** cisatracurium, etomidate, fentaNYL, midazolam, ondansetron (ZOFRAN) IV, sodium chloride flush   Vital Signs    Vitals:   01/04/18 0645 01/04/18 0700 01/04/18 0800 01/04/18 0826  BP: (!) 144/82 (!) 163/90    Pulse: (!) 55 (!) 57    Resp: (!) 26 (!) 26    Temp:   (!) 92.1 F (33.4 C)   TempSrc:      SpO2: 95% 96%  94%  Weight:        Intake/Output Summary (Last 24 hours) at 01/04/2018 0902 Last data filed at 01/04/2018 0354 Gross per 24 hour  Intake 2997.54 ml    Output 1970 ml  Net 1027.54 ml   Filed Weights   01/03/18 1700 01/04/18 0558  Weight: 95 kg 96.3 kg    Telemetry    Sinus rhythm- Personally Reviewed  ECG    Sinus rhythm, anterolateral T wave inversions- Personally Reviewed  Physical Exam   GEN:  Intubated and sedated Neck: No JVD Cardiac: RRR, no murmurs, rubs, or gallops.  Respiratory: Clear to auscultation bilaterally. GI: Soft, nontender, non-distended  MS: No edema; No deformity. Neuro:  Nonfocal  Psych: Normal affect   Labs    Chemistry Recent Labs  Lab 01/03/18 1858  01/04/18 0138 01/04/18 0409 01/04/18 0634 01/04/18 0809  NA 141   < > 138  141 140 145 146*  K 4.2   < > 5.0  5.0 3.0* 3.0* 2.7*  CL 106   < > 107  109 113* 110 109  CO2 20*  --  19* 21*  --   --   GLUCOSE 107*   < > 142*  157* 120* 124* 110*  BUN 13   < > _0 CREATININE 1.18   < > 0.96  0.80 0.83 0.70 0.70  CALCIUM 8.4*  --  7.8* 7.7*  --   --   GFRNONAA >60  --  >60 >60  --   --   GFRAA >60  --  >  60 >60  --   --   ANIONGAP 15  --  12 6  --   --    < > = values in this interval not displayed.     Hematology Recent Labs  Lab 01/03/18 1310  01/03/18 1624  01/04/18 0409 01/04/18 0634 01/04/18 0809  WBC 11.8*  --  9.0  --  11.0*  --   --   RBC 5.26  --  5.56  --  4.87  --   --   HGB 15.3   < > 15.9   < > 14.3 13.9 12.9*  HCT 49.2   < > 50.1   < > 42.4 41.0 38.0*  MCV 93.5  --  90.1  --  87.1  --   --   MCH 29.1  --  28.6  --  29.4  --   --   MCHC 31.1  --  31.7  --  33.7  --   --   RDW 12.3  --  12.4  --  12.4  --   --   PLT 216  --  227  --  154  --   --    < > = values in this interval not displayed.    Cardiac Enzymes Recent Labs  Lab 01/03/18 1624 01/03/18 1858 01/04/18 0138 01/04/18 0409  TROPONINI 7.13*  6.82* 11.07* 13.06* 11.19*    Recent Labs  Lab 01/03/18 1315  TROPIPOC 0.39*     BNPNo results for input(s): BNP, PROBNP in the last 168 hours.   DDimer No results for input(s):  DDIMER in the last 168 hours.   Radiology    Ct Head Wo Contrast  Result Date: 01/04/2018 CLINICAL DATA:  Altered mental status. Cardiac arrest. History of hypertension. EXAM: CT HEAD WITHOUT CONTRAST TECHNIQUE: Contiguous axial images were obtained from the base of the skull through the vertex without intravenous contrast. COMPARISON:  CT face March 14, 2015 FINDINGS: BRAIN: No intraparenchymal hemorrhage, mass effect nor midline shift. The ventricles and sulci are normal. No acute large vascular territory infarcts. No abnormal extra-axial fluid collections. Basal cisterns are patent. VASCULAR: Unremarkable. SKULL/SOFT TISSUES: No skull fracture. No significant soft tissue swelling. ORBITS/SINUSES: The included ocular globes and orbital contents are normal.Chronic perforated nasal septum. Severe pan paranasal sinusitis. OTHER: None. IMPRESSION: 1. Negative CT HEAD without contrast. 2. Severe paranasal sinusitis. Electronically Signed   By: Elon Alas M.D.   On: 01/04/2018 05:55   Dg Chest Port 1 View  Result Date: 01/03/2018 CLINICAL DATA:  Post cardiac arrest and CPR. EXAM: PORTABLE CHEST 1 VIEW COMPARISON:  04/20/2015 FINDINGS: Endotracheal tube terminates 4.9 cm superior to the carina, advancement with approximately 2 cm may be considered. Cardiomediastinal silhouette is normal. Mediastinal contours appear intact. There is no evidence of focal airspace consolidation, pleural effusion or pneumothorax. Osseous structures are without acute abnormality. Soft tissues are grossly normal. IMPRESSION: Endotracheal tube terminates 4.9 cm superior to the carina, advancement with approximately 2 cm may be considered. Otherwise no acute findings within the thorax. Electronically Signed   By: Fidela Salisbury M.D.   On: 01/03/2018 13:51   Dg Abd Portable 1v  Result Date: 01/04/2018 CLINICAL DATA:  OG tube placement EXAM: PORTABLE ABDOMEN - 1 VIEW COMPARISON:  None. FINDINGS: The OG tube terminates  in the central mid abdomen, likely in the distal stomach. IMPRESSION: The OG tube is thought to terminate in the distal stomach. Electronically Signed   By: Dorise Bullion III M.D  On: 01/04/2018 00:06    Cardiac Studies   Left heart catheterization 01/03/2018  CULPRIT BIFURCATION LESION: Prox LAD lesion is 60% stenosed with 75% stenosed side branch in Ost 1st Diag. Prox LAD to Mid LAD lesion is 100% stenosed.  A drug-eluting stent was successfully placed (beginning proximal to diagonal branch) using a STENT SYNERGY DES 3X28. -Postdilated to 3.6 mm. Post intervention, there is a 0% residual stenosis in the LAD  Balloon angioplasty was performed on Ost 1st Diag using a BALLOON SAPPHIRE 2.0X12. Post intervention, the side branch was reduced to 20% residual stenosis.  ----------------------------------------------------------  Ost Ramus lesion is 65% stenosed.  ----------------------------------------------------------  There is mild left ventricular systolic dysfunction. The left ventricular ejection fraction is 45-50% by visual estimate.  LV End Diastolic Pressure is mildly elevated pre-PCI, however post PCI down to 13 mmHg and PCWP 11 mmHg  NORMAL RIGHT HEART CATH PRESSURES  Patient Profile     47 y.o. male with no known prior cardiac history presented to the hospital after a witnessed cardiac arrest.  Assessment & Plan    1.  Acute anterolateral wall ST elevation MI: Patient presented to the hospital with a VT arrest.  He was found to have ST elevations anteriorly.  He has a complex coronary anatomy.  He was found to have a proximal to mid 100% LAD lesion and had a drug-eluting stent placed currently on aspirin and ticagrelor.  Would continue with current management.  He will most likely need a high intensity statin.  We will get a fasting lipid panel later this hospitalization.  2.  Cardiac arrest: Likely caused by his anterolateral wall STEMI in combination with cocaine abuse  which was found around his body.  No indication for ICD at this time.  3.  Cocaine abuse: Drug screen currently pending.  Will likely need extensive counseling.  4.  Hypertension: Currently undergoing cooling protocol.  Will address pending neurologic recovery.  For questions or updates, please contact [place] Please consult [email or web address] for contact info under        Signed, Will Meredith Leeds, MD  01/04/2018, 9:02 AM

## 2018-01-05 ENCOUNTER — Inpatient Hospital Stay (HOSPITAL_COMMUNITY): Payer: Self-pay

## 2018-01-05 ENCOUNTER — Encounter (HOSPITAL_COMMUNITY): Payer: Self-pay | Admitting: Cardiology

## 2018-01-05 DIAGNOSIS — J9690 Respiratory failure, unspecified, unspecified whether with hypoxia or hypercapnia: Secondary | ICD-10-CM

## 2018-01-05 DIAGNOSIS — I4901 Ventricular fibrillation: Secondary | ICD-10-CM

## 2018-01-05 DIAGNOSIS — J9600 Acute respiratory failure, unspecified whether with hypoxia or hypercapnia: Secondary | ICD-10-CM

## 2018-01-05 LAB — CBC
HCT: 41.1 % (ref 39.0–52.0)
Hemoglobin: 14 g/dL (ref 13.0–17.0)
MCH: 29.6 pg (ref 26.0–34.0)
MCHC: 34.1 g/dL (ref 30.0–36.0)
MCV: 86.9 fL (ref 80.0–100.0)
PLATELETS: 125 10*3/uL — AB (ref 150–400)
RBC: 4.73 MIL/uL (ref 4.22–5.81)
RDW: 12.8 % (ref 11.5–15.5)
WBC: 9.5 10*3/uL (ref 4.0–10.5)
nRBC: 0 % (ref 0.0–0.2)

## 2018-01-05 LAB — BASIC METABOLIC PANEL
ANION GAP: 5 (ref 5–15)
ANION GAP: 6 (ref 5–15)
BUN: 10 mg/dL (ref 6–20)
BUN: 7 mg/dL (ref 6–20)
CALCIUM: 7.3 mg/dL — AB (ref 8.9–10.3)
CHLORIDE: 113 mmol/L — AB (ref 98–111)
CO2: 20 mmol/L — ABNORMAL LOW (ref 22–32)
CO2: 21 mmol/L — AB (ref 22–32)
CREATININE: 1.09 mg/dL (ref 0.61–1.24)
Calcium: 7.5 mg/dL — ABNORMAL LOW (ref 8.9–10.3)
Chloride: 116 mmol/L — ABNORMAL HIGH (ref 98–111)
Creatinine, Ser: 1.1 mg/dL (ref 0.61–1.24)
GFR calc Af Amer: >60 mL/min (ref >60)
GFR calc Af Amer: >60 mL/min (ref >60)
GFR calc non Af Amer: >60 mL/min (ref >60)
GLUCOSE: 124 mg/dL — AB (ref 70–99)
Glucose, Bld: 133 mg/dL — ABNORMAL HIGH (ref 70–99)
POTASSIUM: 3.5 mmol/L (ref 3.5–5.1)
POTASSIUM: 2.9 mmol/L — AB (ref 3.5–5.1)
SODIUM: 141 mmol/L (ref 135–145)
Sodium: 140 mmol/L (ref 135–145)

## 2018-01-05 LAB — COMPREHENSIVE METABOLIC PANEL
ALT: 92 U/L — AB (ref 0–44)
AST: 136 U/L — AB (ref 15–41)
Albumin: 2.7 g/dL — ABNORMAL LOW (ref 3.5–5.0)
Alkaline Phosphatase: 52 U/L (ref 38–126)
Anion gap: 7 (ref 5–15)
BUN: 7 mg/dL (ref 6–20)
CALCIUM: 7.4 mg/dL — AB (ref 8.9–10.3)
CHLORIDE: 111 mmol/L (ref 98–111)
CO2: 21 mmol/L — ABNORMAL LOW (ref 22–32)
CREATININE: 1.06 mg/dL (ref 0.61–1.24)
GFR calc non Af Amer: >60 mL/min (ref >60)
Glucose, Bld: 130 mg/dL — ABNORMAL HIGH (ref 70–99)
Potassium: 2.7 mmol/L — CL (ref 3.5–5.1)
Sodium: 139 mmol/L (ref 135–145)
TOTAL PROTEIN: 5.1 g/dL — AB (ref 6.5–8.1)
Total Bilirubin: 1.1 mg/dL (ref 0.3–1.2)

## 2018-01-05 LAB — GLUCOSE, CAPILLARY
GLUCOSE-CAPILLARY: 124 mg/dL — AB (ref 70–99)
GLUCOSE-CAPILLARY: 125 mg/dL — AB (ref 70–99)
GLUCOSE-CAPILLARY: 149 mg/dL — AB (ref 70–99)
Glucose-Capillary: 132 mg/dL — ABNORMAL HIGH (ref 70–99)
Glucose-Capillary: 370 mg/dL — ABNORMAL HIGH (ref 70–99)
Glucose-Capillary: 125 mg/dL — ABNORMAL HIGH (ref 70–99)
Glucose-Capillary: 86 mg/dL (ref 70–99)
Glucose-Capillary: 70 mg/dL (ref 70–99)
Glucose-Capillary: 59 mg/dL — ABNORMAL LOW (ref 70–99)
Glucose-Capillary: 80 mg/dL (ref 70–99)

## 2018-01-05 LAB — MAGNESIUM
MAGNESIUM: 2.2 mg/dL (ref 1.7–2.4)
MAGNESIUM: 2 mg/dL (ref 1.7–2.4)
MAGNESIUM: 2.2 mg/dL (ref 1.7–2.4)

## 2018-01-05 LAB — PHOSPHORUS
PHOSPHORUS: 3.5 mg/dL (ref 2.5–4.6)
Phosphorus: 2.3 mg/dL — ABNORMAL LOW (ref 2.5–4.6)
Phosphorus: 2.6 mg/dL (ref 2.5–4.6)

## 2018-01-05 LAB — LACTIC ACID, PLASMA: LACTIC ACID, VENOUS: 1.8 mmol/L (ref 0.5–1.9)

## 2018-01-05 MED ORDER — ASPIRIN 81 MG PO CHEW
81.0000 mg | CHEWABLE_TABLET | Freq: Every day | ORAL | Status: DC
  Administered 2018-01-06 – 2018-01-08 (×3): 81 mg
  Filled 2018-01-05 (×5): qty 1

## 2018-01-05 MED ORDER — TICAGRELOR 90 MG PO TABS
90.0000 mg | ORAL_TABLET | Freq: Two times a day (BID) | ORAL | Status: DC
  Administered 2018-01-05 – 2018-01-09 (×7): 90 mg
  Filled 2018-01-05 (×7): qty 1

## 2018-01-05 MED ORDER — PRO-STAT SUGAR FREE PO LIQD: 30.0000 mL | Freq: Two times a day (BID) | ORAL | Status: DC

## 2018-01-05 MED ORDER — DEXTROSE 10 % IV SOLN
INTRAVENOUS | Status: DC
  Administered 2018-01-05 – 2018-01-07 (×3): via INTRAVENOUS

## 2018-01-05 MED ORDER — VITAL HIGH PROTEIN PO LIQD
1000.0000 mL | ORAL | Status: DC
  Administered 2018-01-05 – 2018-01-07 (×5): 1000 mL

## 2018-01-05 MED ORDER — POTASSIUM CHLORIDE 10 MEQ/50ML IV SOLN
10.0000 meq | INTRAVENOUS | Status: AC
  Administered 2018-01-05 (×2): 10 meq via INTRAVENOUS
  Filled 2018-01-05 (×2): qty 50

## 2018-01-05 MED ORDER — DEXTROSE 50 % IV SOLN
50.0000 mL | Freq: Once | INTRAVENOUS | Status: AC
  Administered 2018-01-05: 50 mL via INTRAVENOUS
  Filled 2018-01-05: qty 50

## 2018-01-05 MED ORDER — CALCIUM GLUCONATE-NACL 1-0.675 GM/50ML-% IV SOLN
1.0000 g | Freq: Once | INTRAVENOUS | Status: AC
  Administered 2018-01-05: 1000 mg via INTRAVENOUS
  Filled 2018-01-05: qty 50

## 2018-01-05 NOTE — Progress Notes (Signed)
Initial Nutrition Assessment  DOCUMENTATION CODES:   Not applicable  INTERVENTION:   -Vital High Protein @ 40 ml/hr via OGT -Increase by 10 ml Q6 hours to goal of 65 ml/hr (1560 ml)  At goal rate TF provides: 1560 kcals (2162 with propofol), 137 grams protein, 1310 ml free water. Meets 104% kcal needs and 100% of protein needs.   Monitor magnesium, potassium, and phosphorus daily for at least 3 days, pt is at risk for refeeding syndrome. Plan to replete phosphorus this am.   NUTRITION DIAGNOSIS:   Increased nutrient needs related to post-op healing as evidenced by estimated needs.  GOAL:   Patient will meet greater than or equal to 90% of their needs  MONITOR:   Diet advancement, Vent status, Labs, Weight trends, TF tolerance, I & O's  REASON FOR ASSESSMENT:   Ventilator    ASSESSMENT:   Patient with PMH significant for HTN. Presents this admission after a witnessed cardiac arrest at a hotel. Admitted for VT arrest from STEMI with suspicion of cocaine abuse.     11/2- emergent left/right cardiac cath  Pt in rewarming phase of hypothermia protocol. He is off paralytics and still requiring pressors. Spoke with RN regarding sedation, plan to be weaned from all sedation by 4 pm today. Will begin feeding today and adjust rate as propofol is weaned (using dry weight of 93.9 kg to estimate needs).   Spoke with girlfriend at bedside who denies pt had any issues with eating PTA. He typically eats 3 meals with snacks daily. Reports pt has maintained his weight of 210-215 lb over the last two years. Records are limited but support this report.   Patient is currently intubated on ventilator support MV: 15.7 L/min Temp (24hrs), Avg:92.9 F (33.8 C), Min:90.5 F (32.5 C), Max:97.2 F (36.2 C) BP: 112/62 MAP: 77 Propofol: 22.8 ml/hr-provides 602 kcal  I/O: +4.3 L since admit UOP: 1460 ml x 24 hrs NG: 1100 ml x 24 hrs  Medications reviewed and include: fentanyl, versed,  levophed, propofol Labs reviewed: phosphorus 2.3 (L) corrected calcium 8.4 (L) calcium ionized 1.00 (L)  NUTRITION - FOCUSED PHYSICAL EXAM:    Most Recent Value  Orbital Region  No depletion  Upper Arm Region  No depletion  Thoracic and Lumbar Region  Unable to assess  Buccal Region  No depletion  Temple Region  No depletion  Clavicle Bone Region  No depletion  Clavicle and Acromion Bone Region  No depletion  Scapular Bone Region  Unable to assess  Dorsal Hand  No depletion  Patellar Region  No depletion  Anterior Thigh Region  Unable to assess  Posterior Calf Region  No depletion  Edema (RD Assessment)  None     Diet Order:   Diet Order            Diet NPO time specified  Diet effective now              EDUCATION NEEDS:   Not appropriate for education at this time  Skin:  Skin Assessment: Reviewed RN Assessment  Last BM:  01/04/18  Height:   Ht Readings from Last 1 Encounters:  01/05/18 5\' 11"  (1.803 m)    Weight:   Wt Readings from Last 1 Encounters:  01/05/18 96.9 kg    Ideal Body Weight:  78.2 kg  BMI:  Body mass index is 29.79 kg/m.  Estimated Nutritional Needs:   Kcal:  2083 kcal  Protein:  130-145 grams  Fluid:  >/= 2  L/day   Mariana Single RD, LDN Clinical Nutrition Pager # [phone]

## 2018-01-05 NOTE — Progress Notes (Addendum)
Paged CCM as patient HR now ST 120's  And pt shivering. MD verbal order to restart fentanyl at 50 mcg and titrate up if needed. Will restart Fentanyl gtt and monitor closely.   Delories Heinz, RN

## 2018-01-05 NOTE — Progress Notes (Signed)
RT NOTES: Patient biting on tube. Bite block placed per request from nurse.

## 2018-01-05 NOTE — Progress Notes (Signed)
NAME:  Rodney Lloyd, MRN:  [id number], DOB:  22-Nov-1970, LOS: 2 ADMISSION DATE:  01/03/2018, CONSULTATION DATE:  01/03/2018 REFERRING MD:  EDP - [place], CHIEF COMPLAINT:  Cardiac arrest   Brief History   47 year old male with history of HTN who presents to PCCM in the ED after a VT arrest outside the hospital.  Bystander CPR was started and EMS arrived.  Total of 4 rounds of epi and 4 shocks delivered.  With ROSC patient was in sinus tach and was noted to have ST segment elevation and code STEMI was called.  Upon arrival, patient was sedated and intubated and was being taken to the cath lab for emergent cath.  No further history available at this time.  Past Medical History  HTN  Significant Hospital Events   11/2>>>STEMI to cath lab post arrest 11/4 >>> cooling over. Attempting to wake up. Still on pressors.   Consults: date of consult/date signed off & final recs:  PCCM 11/2>>>  Procedures (surgical and bedside):  Cardiac cath 11/2 > A few areas of stenosis. Culprit lesion: Prox LAD lesion is 60% stenosed with 75% stenosed side branch in Ost 1st Diag. Prox LAD to Mid LAD lesion is 100% stenosed. Intervention: balloon angioplasty and DES  Significant Diagnostic Tests:  Echo 11/2 > EF 55-60%, Mild hypokinesis of the apical anterior myocardium; consistent with ischemia in the distribution of the distal left anterior descending coronary artery. Grade 1 diastolic dysfunction, no evidence of thrombus CT head 11/3 > Negative CT HEAD without contrast. Severe paranasal sinusitis. EEG 11/3 > burst suppression pattern. No seizure activity identified on limited time study.   Micro Data:  11/2>> Blood 11/2>> Respiratory 11/2>> PCR negative  Antimicrobials:  Unasyn 01/03/2018  Subjective/Interval:  Sedated and intubated: now rewarmed. Still on levophed 25 mcg and high dose sedation.   Objective   Blood pressure 118/67, pulse 83, temperature (!) 97.2 F (36.2 C), temperature source Bladder,  resp. rate (!) 26, height 5\' 11"  (1.803 m), weight 96.9 kg, SpO2 100 %. PAP: (28-38)/(16-23) 36/22 CVP:  [8 mmHg-16 mmHg] 10 mmHg  Vent Mode: PRVC FiO2 (%):  [40 %] 40 % Set Rate:  [26 bmp] 26 bmp Vt Set:  [600 mL] 600 mL PEEP:  [5 cmH20] 5 cmH20 Plateau Pressure:  [20 cmH20-23 cmH20] 22 cmH20   Intake/Output Summary (Last 24 hours) at 01/05/2018 1031 Last data filed at 01/05/2018 1000 Gross per 24 hour  Intake 4885.35 ml  Output 2065 ml  Net 2820.35 ml   Filed Weights   01/03/18 1700 01/04/18 0558 01/05/18 0400  Weight: 95 kg 93.9 kg 96.9 kg    Examination: General: middle aged male in NAD HENT: [place]/AT, PERRL, no appreciable JVD Lungs: coarse bilateral breath souds Cardiovascular: RRR, no MRG Abdomen: Soft, NT, ND and +BS Extremities: no acute deformity Neuro: RASS -4.  Skin: Grossly intact.   Resolved Hospital Problem list     Assessment & Plan:   VT cardiac arrest: in the setting of myocardial infarction likely caused by existing CAD and cocaine use.       - Hypothermia protocol > now rewarmed.  - Stent placed in cath lab 11/2 - Cardiology primary: recommending DAPT and statin.    Cardiogenic shock: + 1.4 L since admission - Continue levophed, wean as able for MAP goal now 65 mmHg - Hopeful there is a component of sedation induced hypotension and we will be able to reduce pressors along with sedation - If not may  need to place CVL, currently getting pressors via sheath for PA cath.   Acute respiratory failure: - Full vent support while cooling - ABG prn - Titrate O2 for sat of 92-95% - CXR daily - Continue Unasyn > Sinusitis per CT, cannot rule out PNA  Acute anoxic encephalopathy: - Neuro checks - Rewarmed today, will attempt to reduce sedation to gain neuro exam.  - Propofol, fentanyl, and versed infusions. Wean to RASS 0 to -1.   Hypoaklaemia - Trend BMET - Replace K today with goal 4.  - Monitor UO   - Replete electrolytes prn            Disposition / Summary of Today's Plan 01/05/18     Labs   CBC: Recent Labs  Lab 01/03/18 1310  01/03/18 1624  01/04/18 0409 01/04/18 0634 01/04/18 0809 01/04/18 1157 01/04/18 1621 01/05/18 0348  WBC 11.8*  --  9.0  --  11.0*  --   --   --   --  9.5  HGB 15.3   < > 15.9   < > 14.3 13.9 12.9* 12.9* 12.6* 14.0  HCT 49.2   < > 50.1   < > 42.4 41.0 38.0* 38.0* 37.0* 41.1  MCV 93.5  --  90.1  --  87.1  --   --   --   --  86.9  PLT 216  --  227  --  154  --   --   --   --  125*   < > = values in this interval not displayed.    Basic Metabolic Panel: Recent Labs  Lab 01/04/18 0409  01/04/18 1621 01/04/18 2002 01/05/18 0005 01/05/18 0348 01/05/18 0741  NA 140   < > 145 140 140 139 141  K 3.0*   < > 2.5* 3.1* 2.9* 2.7* 3.5  CL 113*   < > 111 112* 113* 111 116*  CO2 21*  --   --  20* 21* 21* 20*  GLUCOSE 120*   < > 124* 131* 133* 130* 124*  BUN 12   < > 9 11 10 7 7   CREATININE 0.83   < > 0.70 0.99 1.10 1.06 1.09  CALCIUM 7.7*  --   --  7.6* 7.5* 7.4* 7.3*  MG 1.7  --   --   --   --  2.2  --   PHOS <1.0*  --   --  2.0*  --  2.3*  --    < > = values in this interval not displayed.   GFR: Estimated Creatinine Clearance: 99.4 mL/min (by C-G formula based on SCr of 1.09 mg/dL). Recent Labs  Lab 01/03/18 1310 01/03/18 1318 01/03/18 1624 01/04/18 0409 01/05/18 0348 01/05/18 0404  WBC 11.8*  --  9.0 11.0* 9.5  --   LATICACIDVEN  --  10.67* 3.3*  --   --  1.8    Liver Function Tests: Recent Labs  Lab 01/05/18 0348  AST 136*  ALT 92*  ALKPHOS 52  BILITOT 1.1  PROT 5.1*  ALBUMIN 2.7*   No results for input(s): LIPASE, AMYLASE in the last 168 hours. No results for input(s): AMMONIA in the last 168 hours.  ABG    Component Value Date/Time   PHART 7.330 (L) 01/03/2018 1442   PCO2ART 42.7 01/03/2018 1442   PO2ART 47.0 (L) 01/03/2018 1442   HCO3 22.5 01/03/2018 1442   TCO2 18 (L) 01/04/2018 1621   ACIDBASEDEF 3.0 (H) 01/03/2018 1442   O2SAT  89.1 01/04/2018  1410     Coagulation Profile: Recent Labs  Lab 01/03/18 1310 01/03/18 1624 01/04/18 0138  INR 1.09 1.49 1.19    Cardiac Enzymes: Recent Labs  Lab 01/03/18 1624 01/03/18 1858 01/04/18 0138 01/04/18 0409  TROPONINI 7.13*  6.82* 11.07* 13.06* 11.19*    HbA1C: No results found for: HGBA1C  CBG: Recent Labs  Lab 01/04/18 1808 01/04/18 2007 01/04/18 2256 01/05/18 0419 01/05/18 0752  GLUCAP 94 124* 124* 125* 125*    Admitting History of Present Illness.   48 year old male with history of HTN who presents to PCCM in the ED after a VT arrest outside the hospital.  Bystander CPR was started and EMS arrived.  Total of 4 rounds of epi and 4 shocks delivered.  With ROSC patient was in sinus tach and was noted to have ST segment elevation and code STEMI was called.  Upon arrival, patient was sedated and intubated and was being taken to the cath lab for emergent cath.  CULPRIT BIFURCATION LESION: Prox LAD lesion is 60% stenosed with 75% stenosed side branch in Ost 1st Diag. Prox LAD to Mid LAD lesion is 100% stenosed. A drug-eluting stent was successfully placed (beginning proximal to diagonal branch) using a STENT SYNERGY DES 3X28. -Postdilated to 3.6 mm. Post intervention, there is a 0% residual stenosis in the LAD.   Review of Systems:   Unattainable  Past Medical History  He,  has a past medical history of Hypertension.   Surgical History    Past Surgical History:  Procedure Laterality Date  . CORONARY/GRAFT ACUTE MI REVASCULARIZATION N/A 01/03/2018   Procedure: Coronary/Graft Acute MI Revascularization;  Surgeon: Marykay Lex, MD;  Location: [facility]AS[facility] CV LAB;  Service: Cardiovascular;  Laterality: N/A;  . RIGHT/LEFT HEART CATH AND CORONARY ANGIOGRAPHY N/A 01/03/2018   Procedure: RIGHT/LEFT HEART CATH AND CORONARY ANGIOGRAPHY;  Surgeon: Marykay Lex, MD;  Location: [facility] INVASIVE CV LAB;  Service: Cardiovascular;  Laterality: N/A;     Social History   Social  History   Socioeconomic History  . Marital status: Divorced    Spouse name: Not on file  . Number of children: Not on file  . Years of education: Not on file  . Highest education level: Not on file  Occupational History  . Not on file  Social Needs  . Financial resource strain: Not on file  . Food insecurity:    Worry: Not on file    Inability: Not on file  . Transportation needs:    Medical: Not on file    Non-medical: Not on file  Tobacco Use  . Smoking status: Current Every Day Smoker  Substance and Sexual Activity  . Alcohol use: Yes  . Drug use: No  . Sexual activity: Not on file  Lifestyle  . Physical activity:    Days per week: Not on file    Minutes per session: Not on file  . Stress: Not on file  Relationships  . Social connections:    Talks on phone: Not on file    Gets together: Not on file    Attends religious service: Not on file    Active member of club or organization: Not on file    Attends meetings of clubs or organizations: Not on file    Relationship status: Not on file  . Intimate partner violence:    Fear of current or ex partner: Not on file    Emotionally abused: Not on file  Physically abused: Not on file    Forced sexual activity: Not on file  Other Topics Concern  . Not on file  Social History Narrative  . Not on file  ,  reports that he has been smoking. He does not have any smokeless tobacco history on file. He reports that he drinks alcohol. He reports that he does not use drugs.   Family History   His family history includes Heart disease in his father; Hyperlipidemia in his mother.   Allergies No Known Allergies   Home Medications  Prior to Admission medications   Medication Sig Start Date End Date Taking? Authorizing Provider  acetaminophen (TYLENOL) 325 MG tablet Take 650 mg by mouth every 6 (six) hours as needed for mild pain.     [provider]  cephALEXin (KEFLEX) 500 MG capsule Take 1 capsule (500 mg total) by  mouth 4 (four) times daily. 03/14/15   Pricilla Loveless, MD  diclofenac (VOLTAREN) 50 MG EC tablet Take 1 tablet (50 mg total) by mouth 2 (two) times daily. 04/20/15   Janne Napoleon, NP  hydrochlorothiazide (HYDRODIURIL) 25 MG tablet Take 1 tablet (25 mg total) by mouth daily. 02/15/15   Hoy Register, MD  methocarbamol (ROBAXIN) 500 MG tablet Take 1 tablet (500 mg total) by mouth 2 (two) times daily. 04/20/15   Janne Napoleon, NP  nicotine (NICODERM CQ - DOSED IN MG/24 HOURS) 14 mg/24hr patch Place 1 patch (14 mg total) onto the skin daily. Patient not taking: Reported on 03/14/2015 02/15/15   Hoy Register, MD    My critical care time excluding procedures: 35 mins  Joneen Roach, AGACNP-BC [facility] Pulmonary/Critical Care Pager [phone] or [phone]  01/05/2018 10:31 AM

## 2018-01-05 NOTE — Progress Notes (Signed)
Pt 38.2* C and shivering with very low water temps. PRN tylenol given per tube, Fentanyl increased, and warm blankets applied to patient. Will continue to monitor closely.  Delories Heinz, RN

## 2018-01-05 NOTE — Progress Notes (Signed)
Wasted 80 ml Propofol with Ellwood Handler, RN in sharps box.  Delories Heinz, RN

## 2018-01-05 NOTE — Care Management Note (Signed)
Case Management Note  Patient Details  Name: Rodney Lloyd MRN: [id number] Date of Birth: 1970/08/28  Subjective/Objective:  47yo male presented post cardiac areest; s/p emergent cath with PCI.                   Action/Plan: CM consult for Brilinta assist acknowledged. Patient remains critically ill on vent support. PCP listed as: [facility]; patient was utilizing [facility] for Rx needs. CM will continue to follow to provide a Brilinta free 30 day card and to assist with transitional needs.   Expected Discharge Date:                  Expected Discharge Plan:  Home/Self Care  In-House Referral:  NA  Discharge planning Services  CM Consult, Medication Assistance(Brilinta)  Post Acute Care Choice:  NA Choice offered to:  NA  DME Arranged:  N/A DME Agency:  NA  HH Arranged:  NA HH Agency:  NA  Status of Service:  In process, will continue to follow  If discussed at Long Length of Stay Meetings, dates discussed:    Additional Comments:  Colleen Can RN, BSN, NCM-BC, ACM-RN [phone] 01/05/2018, 12:12 PM

## 2018-01-05 NOTE — Progress Notes (Signed)
eLink Physician-Brief Progress Note Patient Name: Rodney Lloyd DOB: [birth year] MRN: [id number]   Date of Service  01/05/2018  HPI/Events of Note  Hypoglycemia - Blood glucose = 59. Being given D50.  eICU Interventions  Will order: D10W to run IV at 30 mL/hour.      Intervention Category Major Interventions: Other:  [name], [name] 01/05/2018, 8:23 PM

## 2018-01-05 NOTE — Progress Notes (Addendum)
Progress Note  Patient Name: Rodney Lloyd Date of Encounter: 01/05/2018  Primary Cardiologist: No primary provider on file   Subjective   Patient is intubated. 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   Inpatient Medications    Scheduled Meds: . artificial tears  1 application Both Eyes R4B  . aspirin  81 mg Oral Daily  . chlorhexidine gluconate (MEDLINE KIT)  15 mL Mouth Rinse BID  . heparin  5,000 Units Subcutaneous Q8H  . insulin aspart  2-6 Units Subcutaneous Q4H  . mouth rinse  15 mL Mouth Rinse 10 times per day  . nicotine  14 mg Transdermal Daily  . sodium chloride flush  3 mL Intravenous Q12H  . ticagrelor  90 mg Oral BID   Continuous Infusions: . sodium chloride    . sodium chloride 100 mL/hr at 01/05/18 0800  . sodium chloride 10 mL/hr at 01/04/18 1500  . ampicillin-sulbactam (UNASYN) IV Stopped (01/05/18 6384)  . cisatracurium (NIMBEX) infusion Stopped (01/05/18 0916)  . famotidine (PEPCID) IV 20 mg (01/05/18 0912)  . fentaNYL infusion INTRAVENOUS 300 mcg/hr (01/05/18 0800)  . midazolam (VERSED) infusion 8 mg/hr (01/05/18 0800)  . norepinephrine (LEVOPHED) Adult infusion 20 mcg/min (01/05/18 0800)  . propofol (DIPRIVAN) infusion 50 mcg/kg/min (01/05/18 0800)   PRN Meds: sodium chloride, acetaminophen, [COMPLETED] cisatracurium **AND** cisatracurium (NIMBEX) infusion **AND** cisatracurium, etomidate, fentaNYL, midazolam, ondansetron (ZOFRAN) IV, sodium chloride flush   Vital Signs    Vitals:   01/05/18 0845 01/05/18 0900 01/05/18 0915 01/05/18 0916  BP: 111/65  111/60   Pulse:      Resp: (!) 26 (!) 26 (!) 26 (!) 26  Temp:  (!) 96.6 F (35.9 C)  (S) (!) 96.8 F (36 C)  TempSrc:  Bladder  Bladder  SpO2:  100%    Weight:        Intake/Output Summary (Last 24 hours) at 01/05/2018 0920 Last data filed at 01/05/2018 0800 Gross per 24 hour  Intake 4490.49 ml  Output 2025 ml  Net 2465.49 ml    I/O since  admission:   Filed Weights   01/03/18 1700 01/04/18 0558 01/05/18 0400  Weight: 95 kg 93.9 kg 96.9 kg    Telemetry    Sinus rhythm, ventricular pacing- Personally Reviewed  ECG    ECG (independently read by me):  11/2- Anterolateral st elevations, sinus rhythm, ventricularly paced  11/3- T wave inversions in lateral leads, sinus bradycardia   Physical Exam    BP 111/60   Pulse 83   Temp (S) (!) 96.8 F (36 C) (Bladder)   Resp (!) 26   Wt 96.9 kg Comment: 9.3-2.39kg  SpO2 100%   BMI 32.48 kg/m  General: Alert, oriented, no distress.  Skin: normal turgor, no rashes, warm and dry HEENT: Normocephalic, atraumatic. Nose without nasal septal hypertrophy Mouth/Parynx benign; Intubated patient Neck: No JVD, no carotid bruits; normal carotid upstroke Lungs: clear to ausculatation and percussion; no wheezing or rales Chest wall: without tenderness to palpitation Heart: PMI not displaced, RRR, s1 s2 normal Abdomen: soft, nontender; no hepatosplenomehaly, abdominal aorta nontender and not dilated by palpation. Pulses 2+ Extremities: no clubbing cyanosis or edema Neurologic: sedated with intubation   Labs    Chemistry Recent Labs  Lab 01/05/18 0005 01/05/18 0348 01/05/18 0741  NA 140 139 141  K 2.9* 2.7* 3.5  CL 113* 111 116*  CO2 21* 21* 20*  GLUCOSE 133* 130* 124*  BUN 10 7 7  CREATININE 1.10 1.06 1.09  CALCIUM 7.5* 7.4* 7.3*  PROT  --  5.1*  --   ALBUMIN  --  2.7*  --   AST  --  136*  --   ALT  --  92*  --   ALKPHOS  --  52  --   BILITOT  --  1.1  --   GFRNONAA >60 >60 >60  GFRAA >60 >60 >60  ANIONGAP '6 7 5     ' Hematology Recent Labs  Lab 01/03/18 1624  01/04/18 0409  01/04/18 1157 01/04/18 1621 01/05/18 0348  WBC 9.0  --  11.0*  --   --   --  9.5  RBC 5.56  --  4.87  --   --   --  4.73  HGB 15.9   < > 14.3   < > 12.9* 12.6* 14.0  HCT 50.1   < > 42.4   < > 38.0* 37.0* 41.1  MCV 90.1  --  87.1  --   --   --  86.9  MCH 28.6  --  29.4  --   --    --  29.6  MCHC 31.7  --  33.7  --   --   --  34.1  RDW 12.4  --  12.4  --   --   --  12.8  PLT 227  --  154  --   --   --  125*   < > = values in this interval not displayed.    Cardiac Enzymes Recent Labs  Lab 01/03/18 1624 01/03/18 1858 01/04/18 0138 01/04/18 0409  TROPONINI 7.13*  6.82* 11.07* 13.06* 11.19*    Recent Labs  Lab 01/03/18 1315  TROPIPOC 0.39*     BNPNo results for input(s): BNP, PROBNP in the last 168 hours.   DDimer No results for input(s): DDIMER in the last 168 hours.   Lipid Panel     Component Value Date/Time   TRIG 161 (H) 01/03/2018 1624     Radiology    Ct Head Wo Contrast  Result Date: 01/04/2018 CLINICAL DATA:  Altered mental status. Cardiac arrest. History of hypertension. EXAM: CT HEAD WITHOUT CONTRAST TECHNIQUE: Contiguous axial images were obtained from the base of the skull through the vertex without intravenous contrast. COMPARISON:  CT face March 14, 2015 FINDINGS: BRAIN: No intraparenchymal hemorrhage, mass effect nor midline shift. The ventricles and sulci are normal. No acute large vascular territory infarcts. No abnormal extra-axial fluid collections. Basal cisterns are patent. VASCULAR: Unremarkable. SKULL/SOFT TISSUES: No skull fracture. No significant soft tissue swelling. ORBITS/SINUSES: The included ocular globes and orbital contents are normal.Chronic perforated nasal septum. Severe pan paranasal sinusitis. OTHER: None. IMPRESSION: 1. Negative CT HEAD without contrast. 2. Severe paranasal sinusitis. Electronically Signed   By: Elon Alas M.D.   On: 01/04/2018 05:55   Dg Chest Port 1 View  Result Date: 01/05/2018 CLINICAL DATA:  Intubated, respiratory failure EXAM: PORTABLE CHEST 1 VIEW COMPARISON:  01/03/2018 chest radiograph. FINDINGS: Endotracheal tube tip is 5.5 cm above the carina. Enteric tube enters stomach with the tip not seen on this image. Inferior approach Swan-Ganz catheter terminates over the right hilum likely  within the interlobar branch of the right pulmonary artery. Stable cardiomediastinal silhouette with normal heart size. No pneumothorax. No pleural effusion. Mild hazy infrahilar opacities in both lungs. IMPRESSION: 1. Inferior approach swan Ganz catheter terminates over the right hilum likely within the interlobar pulmonary artery branch. 2. Well-positioned endotracheal and enteric tubes.  3. Mild hazy infrahilar opacities in both lungs, favor atelectasis or mild edema. Electronically Signed   By: Ilona Sorrel M.D.   On: 01/05/2018 08:01   Dg Chest Port 1 View  Result Date: 01/03/2018 CLINICAL DATA:  Post cardiac arrest and CPR. EXAM: PORTABLE CHEST 1 VIEW COMPARISON:  04/20/2015 FINDINGS: Endotracheal tube terminates 4.9 cm superior to the carina, advancement with approximately 2 cm may be considered. Cardiomediastinal silhouette is normal. Mediastinal contours appear intact. There is no evidence of focal airspace consolidation, pleural effusion or pneumothorax. Osseous structures are without acute abnormality. Soft tissues are grossly normal. IMPRESSION: Endotracheal tube terminates 4.9 cm superior to the carina, advancement with approximately 2 cm may be considered. Otherwise no acute findings within the thorax. Electronically Signed   By: Fidela Salisbury M.D.   On: 01/03/2018 13:51   Dg Abd Portable 1v  Result Date: 01/04/2018 CLINICAL DATA:  OG tube placement EXAM: PORTABLE ABDOMEN - 1 VIEW COMPARISON:  None. FINDINGS: The OG tube terminates in the central mid abdomen, likely in the distal stomach. IMPRESSION: The OG tube is thought to terminate in the distal stomach. Electronically Signed   By: Dorise Bullion III M.D   On: 01/04/2018 00:06    Cardiac Studies   Cath (11/2):  CULPRIT BIFURCATION LESION: Prox LAD lesion is 60% stenosed with 75% stenosed side branch in Ost 1st Diag. Prox LAD to Mid LAD lesion is 100% stenosed.  A drug-eluting stent was successfully placed (beginning proximal  to diagonal branch) using a STENT SYNERGY DES 3X28. -Postdilated to 3.6 mm. Post intervention, there is a 0% residual stenosis in the LAD  Balloon angioplasty was performed on Ost 1st Diag using a BALLOON SAPPHIRE 2.0X12. Post intervention, the side branch was reduced to 20% residual stenosis.  ----------------------------------------------------------  Ost Ramus lesion is 65% stenosed.  ----------------------------------------------------------  There is mild left ventricular systolic dysfunction. The left ventricular ejection fraction is 45-50% by visual estimate.  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  Successful PCI of LAD with DES & PTCA of ost-prox 1st Diag  EF ~45% with distal Anterior-anterolateral hypokinesis.  Minimally Elevated LVEDP & PCWP with normal RHC pressures -borderline but not true cardiogenic shock  Low Normal CO/CI (with Neosynephrine off).  Acute Respiratory Failure with Acidemia  Metabolic Acidosis - treated with 2 Amp NaHCO3  RECOMMENDATIONS: Admit to CVICU -PCCM managing ventilator and possible cooling protocol  Convert from [facility] to ticagrelor -[place] upon arrival to CCU  Maintain low-dose Neo-Synephrine for blood pressure support due to sedation  Defer decision making Re: Cooling protocol to PCCM. -->  Anticipate right heart cath removal either later on today versus tomorrow morning.  High-dose statin, hold off on beta-blocker until we see urine toxicology screen.  Recommend uninterrupted dual antiplatelet therapy with Aspirin 44m daily and Ticagrelor 968mtwice daily for a minimum of 12 months (ACS - Class I recommendation).   Will review images with  interventional colleagues to determine if ramus intermedius lesion should be treated prior to discharge or manage medically.   Diagnostic Diagram         Post-Intervention Diagram        Echo (01/04/18): Study Conclusions  - Left ventricle: The cavity  size was mildly reduced. There was   severe concentric hypertrophy. Systolic function was normal. The   estimated ejection fraction was in the range of 55% to 60%. Mild   hypokinesis of the apical anterior myocardium; consistent with   ischemia in the distribution of the distal left anterior   descending coronary artery. Doppler parameters are consistent   with abnormal left ventricular relaxation (grade 1 diastolic   dysfunction). Acoustic contrast opacification revealed no   evidence ofthrombus.  Patient Profile     47 y.o. male with hypertension and no known cardiac history presented post cardiac arrest (defibx4, epi x6pta, placed on cooling protocol) secondary to cocaine use with a anterolateral and inferior st elevations. Code STEMI activated, patient sedated and intubated, and taken for emergent cath that found proximal to mid 100% LAD lesion resulting in placement of des.   Assessment & Plan    1. Acute anterolateral wall STEMI: Troponin peaked to 13. EKG on arrival showing st elevation in anterolateral leads. Emergent cath showing 60% stenosis of prox lad, prox to mid lad 100% stenosed. PCI of LAD, DES and PTCA of ost-prox 1st diagonal.   A1C and lipid panel to be done later in hospitalization. Continue aspirin 65m qd, and ticagrelor 941mbid for 12 months. Need to start high intensity statin.   2. 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 Avoid beta blocker   3. Cardiogenic shock: Continue on levophed to MAP goal of 6543m.   4. Essential Hypertension: BP ranging 110-120/50-70s over past 24 hrs. Will monitor closely after rewarming phase of hypothermia protocol complete today  11/4.  VahLars MageD Internal Medicine PGY2 PagDXAJO:[phone]/06/2017, 10:20 AM    Patient seen and examined with Dr. ChuMaricela BoI have discussed his cardiac situation in detail with his sister as well as significant other.  Angiograms have been reviewed and the case was also discussed with Dr. HarEllyn Hacko performed his intervention.  Agree with assessment and plan.  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  He has a history of tobacco and EtOH use.  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  He sustained a cardiac arrest, VT VF requiring defibrillation x4, LCS, hypothermia protocol, and underwent successful stenting of a totally occluded LAD at the bifurcation of the second diagonal vessel which also was intervened upon.  Presently he remains intubated.  Nimbex was discontinued.  He is in the warming phase of his hypothermia protocol.  BP remains low and he continues on levophed to have a peak goal of 65 mmHg.  Once paralytic wears off, plan ultimately weaning per critical care as respiratory function allows.  His initial ECG showed significant anterior ST elevation with subsequent ECG showing evolutionary T wave inversion.  Initial echo suggests salvage of myocardium with an EF of 55 to 60% with mild residual apical anterior hypokinesis.  There is grade 1 diastolic dysfunction.  There is no evidence for thrombus.  Swan Ganz catheter in place in right femoral vein.  PA pressures not significantly elevated.  May possibly be able to DC later today.  Consider potential amlodipine once taking oral meds.  Avoid beta-blocker with presumed cocaine mediated vasospasm.  Aggressive lipid-lowering therapy with target LDL less than 70 which will also improve endothelial function.  CC: 35 minutes Jerrelle Michelsen A.  Claiborne Billings, MD, [facility] 01/05/2018 11:14  AM

## 2018-01-05 NOTE — Progress Notes (Signed)
CRITICAL VALUE ALERT  Critical Value:   Potassium 2.7  Date & Time Notied:  111/4/19 0528  Provider Notified: Dr Arsenio Loader  Orders Received/Actions taken: none  Pt on rewarming phase of TTM

## 2018-01-05 NOTE — Progress Notes (Signed)
eLink Physician-Brief Progress Note Patient Name: Rodney Lloyd DOB: May 09, 1970 MRN: [id number]   Date of Service  01/05/2018  HPI/Events of Note  K+ = 2.7 and Creatinine = 1.06. In rewarming phase of hypothermia protocol.  eICU Interventions  Will cautiously replace K+.      Intervention Category Major Interventions: Electrolyte abnormality - evaluation and management  Tysheena Ginzburg [place] 01/05/2018, 5:38 AM

## 2018-01-05 NOTE — Progress Notes (Signed)
Rewarming phase started at 2318. 0.30 *C per hour until pt's temp 37 *C per protocol. Settings verified per second RN Peggye Fothergill, RN) will continue to monitor pt.

## 2018-01-05 NOTE — Progress Notes (Signed)
Wasted 25 ml Versed in sink with Higinio Plan, RN.  Delories Heinz, RN

## 2018-01-06 LAB — BLOOD GAS, ARTERIAL
ACID-BASE DEFICIT: 3.3 mmol/L — AB (ref 0.0–2.0)
Bicarbonate: 22 mmol/L (ref 20.0–28.0)
Drawn by: 519031
FIO2: 40
O2 SAT: 98.8 %
PEEP/CPAP: 5 cmH2O
Patient temperature: 98.2
RATE: 14 resp/min
VT: 600 mL
pCO2 arterial: 44.8 mmHg (ref 32.0–48.0)
pH, Arterial: 7.312 — ABNORMAL LOW (ref 7.350–7.450)
pO2, Arterial: 221 mmHg — ABNORMAL HIGH (ref 83.0–108.0)

## 2018-01-06 LAB — HEPATIC FUNCTION PANEL
ALK PHOS: 65 U/L (ref 38–126)
ALT: 65 U/L — AB (ref 0–44)
AST: 68 U/L — AB (ref 15–41)
Albumin: 2.5 g/dL — ABNORMAL LOW (ref 3.5–5.0)
BILIRUBIN DIRECT: 0.3 mg/dL — AB (ref 0.0–0.2)
BILIRUBIN INDIRECT: 0.5 mg/dL (ref 0.3–0.9)
BILIRUBIN TOTAL: 0.8 mg/dL (ref 0.3–1.2)
Total Protein: 5.2 g/dL — ABNORMAL LOW (ref 6.5–8.1)

## 2018-01-06 LAB — BASIC METABOLIC PANEL
ANION GAP: 5 (ref 5–15)
BUN: 9 mg/dL (ref 6–20)
CALCIUM: 7.7 mg/dL — AB (ref 8.9–10.3)
CHLORIDE: 112 mmol/L — AB (ref 98–111)
CO2: 23 mmol/L (ref 22–32)
Creatinine, Ser: 1.09 mg/dL (ref 0.61–1.24)
GFR calc Af Amer: >60 mL/min (ref >60)
GFR calc non Af Amer: >60 mL/min (ref >60)
GLUCOSE: 108 mg/dL — AB (ref 70–99)
Potassium: 4 mmol/L (ref 3.5–5.1)
Sodium: 140 mmol/L (ref 135–145)

## 2018-01-06 LAB — GLUCOSE, CAPILLARY
GLUCOSE-CAPILLARY: 105 mg/dL — AB (ref 70–99)
Glucose-Capillary: 128 mg/dL — ABNORMAL HIGH (ref 70–99)
Glucose-Capillary: 75 mg/dL (ref 70–99)
Glucose-Capillary: 85 mg/dL (ref 70–99)
Glucose-Capillary: 86 mg/dL (ref 70–99)
Glucose-Capillary: 97 mg/dL (ref 70–99)

## 2018-01-06 LAB — CBC
HEMATOCRIT: 40.2 % (ref 39.0–52.0)
Hemoglobin: 12.4 g/dL — ABNORMAL LOW (ref 13.0–17.0)
MCH: 28.5 pg (ref 26.0–34.0)
MCHC: 30.8 g/dL (ref 30.0–36.0)
MCV: 92.4 fL (ref 80.0–100.0)
PLATELETS: 91 10*3/uL — AB (ref 150–400)
RBC: 4.35 MIL/uL (ref 4.22–5.81)
RDW: 13.3 % (ref 11.5–15.5)
WBC: 9.7 10*3/uL (ref 4.0–10.5)
nRBC: 0 % (ref 0.0–0.2)

## 2018-01-06 LAB — PHOSPHORUS
Phosphorus: 3.2 mg/dL (ref 2.5–4.6)
Phosphorus: 2.6 mg/dL (ref 2.5–4.6)

## 2018-01-06 LAB — HEMOGLOBIN A1C
Hgb A1c MFr Bld: 5.7 % — ABNORMAL HIGH (ref 4.8–5.6)
Mean Plasma Glucose: 116.89 mg/dL

## 2018-01-06 LAB — CULTURE, RESPIRATORY W GRAM STAIN

## 2018-01-06 LAB — MAGNESIUM
Magnesium: 2.1 mg/dL (ref 1.7–2.4)
Magnesium: 1.9 mg/dL (ref 1.7–2.4)

## 2018-01-06 MED ORDER — FENTANYL CITRATE (PF) 100 MCG/2ML IJ SOLN
100.0000 ug | INTRAMUSCULAR | Status: AC | PRN
  Administered 2018-01-07 (×3): 100 ug via INTRAVENOUS
  Filled 2018-01-06 (×4): qty 2

## 2018-01-06 MED ORDER — FOLIC ACID 5 MG/ML IJ SOLN
1.0000 mg | Freq: Every day | INTRAMUSCULAR | Status: DC
  Administered 2018-01-06: 1 mg via INTRAVENOUS
  Filled 2018-01-06 (×2): qty 0.2

## 2018-01-06 MED ORDER — FENTANYL CITRATE (PF) 100 MCG/2ML IJ SOLN
100.0000 ug | INTRAMUSCULAR | Status: DC | PRN
  Administered 2018-01-07 – 2018-01-08 (×4): 100 ug via INTRAVENOUS
  Filled 2018-01-06 (×4): qty 2

## 2018-01-06 MED ORDER — THIAMINE HCL 100 MG/ML IJ SOLN
100.0000 mg | Freq: Every day | INTRAMUSCULAR | Status: DC
  Administered 2018-01-06 – 2018-01-08 (×3): 100 mg via INTRAVENOUS
  Filled 2018-01-06 (×3): qty 2

## 2018-01-06 MED ORDER — DEXMEDETOMIDINE HCL IN NACL 400 MCG/100ML IV SOLN
0.0000 ug/kg/h | INTRAVENOUS | Status: DC
  Administered 2018-01-06: 0.8 ug/kg/h via INTRAVENOUS
  Administered 2018-01-06: 0.6 ug/kg/h via INTRAVENOUS
  Administered 2018-01-07: 0.7 ug/kg/h via INTRAVENOUS
  Administered 2018-01-07: 1.1 ug/kg/h via INTRAVENOUS
  Administered 2018-01-07: 0.9 ug/kg/h via INTRAVENOUS
  Filled 2018-01-06 (×5): qty 100

## 2018-01-06 MED ORDER — ATORVASTATIN CALCIUM 40 MG PO TABS
40.0000 mg | ORAL_TABLET | Freq: Every day | ORAL | Status: DC
  Administered 2018-01-06 – 2018-01-12 (×6): 40 mg via ORAL
  Filled 2018-01-06 (×7): qty 1

## 2018-01-06 NOTE — Progress Notes (Signed)
Pharmacy Antibiotic Note  Rodney Lloyd is a 47 y.o. male admitted on 01/03/2018 with VT arrest. Pharmacy has been consulted for Unasyn dosing for possible aspiration pneumonia.  Patient s/p hypothermia protocol. WBC trending down to 9.7. Scr remained stable over the past 24 hour while rewarmed. Patient has good urine out put.   Plan: -Continue Unasyn 3gm IV q6h -Will follow renal function, cultures and clinical progress  Height: 5\' 11"  (180.3 cm) Weight: 224 lb 13.9 oz (102 kg) IBW/kg (Calculated) : 75.3  Temp (24hrs), Avg:99.1 F (37.3 C), Min:98.2 F (36.8 C), Max:100.6 F (38.1 C)  Recent Labs  Lab 01/03/18 1310 01/03/18 1318  01/03/18 1624  01/04/18 0409  01/04/18 2002 01/05/18 0005 01/05/18 0348 01/05/18 0404 01/05/18 0741 01/06/18 0421  WBC 11.8*  --   --  9.0  --  11.0*  --   --   --  9.5  --   --  9.7  CREATININE 1.59*  --    < > 1.23  1.24  1.20   < > 0.83   < > 0.99 1.10 1.06  --  1.09 1.09  LATICACIDVEN  --  10.67*  --  3.3*  --   --   --   --   --   --  1.8  --   --    < > = values in this interval not displayed.    Estimated Creatinine Clearance: 101.9 mL/min (by C-G formula based on SCr of 1.09 mg/dL).    No Known Allergies  Antimicrobials this admission: 11/2 Unasyn >> 11/7   Microbiology results: 11/2 TA: few group b strep 11/2 Bcx: NGTD 11/2 MRSA: negative   Thank you for allowing pharmacy to be a part of this patient's care.  Foye Deer D PGY1 Pharmacy Resident  Phone [phone] Please use AMION for clinical pharmacists numbers  01/06/2018      1:17 PM

## 2018-01-06 NOTE — Progress Notes (Signed)
Ventilator RR found on 14. Per Joneen Roach, NP note, RR is to be set at 26. Rate turned back up to 26 until further notice.

## 2018-01-06 NOTE — Progress Notes (Signed)
Right femoral Swan and venous sheath removed. Dorna Leitz RN and [facility] RN assisted.  Manual pressure held for 10 minutes. Site level 0. VSS with only a few PVCs with swan removal.

## 2018-01-06 NOTE — Progress Notes (Addendum)
Progress Note  Patient Name: Rodney Lloyd Date of Encounter: 01/06/2018  Primary Cardiologist: No primary provider on file   Subjective   The patient was seen resting in his bed with his sigificant other and daughter at bedside. The patient appears to be waking up when we were examining him and he was moving both upper and lower extremities spontaneously. Had conversation with patient regarding the need for lifestyle modifications and to avoid substance use.   Inpatient Medications    Scheduled Meds: . artificial tears  1 application Both Eyes Y4M  . aspirin  81 mg Per Tube Daily  . chlorhexidine gluconate (MEDLINE KIT)  15 mL Mouth Rinse BID  . feeding supplement (VITAL HIGH PROTEIN)  1,000 mL Per Tube Q24H  . heparin  5,000 Units Subcutaneous Q8H  . mouth rinse  15 mL Mouth Rinse 10 times per day  . nicotine  14 mg Transdermal Daily  . sodium chloride flush  3 mL Intravenous Q12H  . ticagrelor  90 mg Per Tube BID   Continuous Infusions: . sodium chloride 10 mL/hr at 01/04/18 1500  . ampicillin-sulbactam (UNASYN) IV Stopped (01/06/18 0710)  . dextrose 30 mL/hr at 01/06/18 0800  . famotidine (PEPCID) IV Stopped (01/05/18 2244)  . fentaNYL infusion INTRAVENOUS 50 mcg/hr (01/06/18 0800)  . norepinephrine (LEVOPHED) Adult infusion 5 mcg/min (01/06/18 0800)   PRN Meds: sodium chloride, acetaminophen, etomidate, fentaNYL, ondansetron (ZOFRAN) IV, sodium chloride flush   Vital Signs    Vitals:   01/06/18 0600 01/06/18 0700 01/06/18 0754 01/06/18 0800  BP: (!) 143/73 126/63  126/72  Pulse: (!) 107 94 85 83  Resp: (!) 26 (!) 26 (!) 26 (!) 8  Temp: 98.2 F (36.8 C)     TempSrc: Core     SpO2: 99% 100% 100% 100%  Weight:      Height:        Intake/Output Summary (Last 24 hours) at 01/06/2018 0820 Last data filed at 01/06/2018 0800 Gross per 24 hour  Intake 3012.9 ml  Output 2200 ml  Net 812.9 ml    I/O since admission: -4855  Filed Weights   01/04/18 0558  01/05/18 0400 01/06/18 0400  Weight: 93.9 kg 96.9 kg 102 kg    Telemetry    Sinus - Personally Reviewed  ECG    ECG (independently read by me): NSR; Resolution of anterior STE   Physical Exam    BP 126/72 (BP Location: Right Arm)   Pulse 83   Temp 98.2 F (36.8 C) (Core)   Resp (!) 8   Ht _0  (1.803 m)   Wt 102 kg   SpO2 100%   BMI 31.36 kg/m  Physical Exam  Constitutional: He appears well-developed and well-nourished. No distress.  HENT:  Head: Normocephalic and atraumatic.  Eyes: Conjunctivae are normal.  Cardiovascular: Normal rate, regular rhythm and normal heart sounds.  Respiratory: Effort normal and breath sounds normal. No respiratory distress. He has no wheezes.  Patient intubated   GI: Soft. Bowel sounds are normal. He exhibits no distension.  Musculoskeletal: He exhibits no edema.  Able to spontaneously move bilateral upper and lower extremities  Neurological: He is alert.  Skin: He is not diaphoretic.  Psychiatric:  Patient is sedated     Labs    Chemistry Recent Labs  Lab 01/05/18 0348 01/05/18 0741 01/06/18 0421  NA 139 141 140  K 2.7* 3.5 4.0  CL 111 116* 112*  CO2 21* 20* 23  GLUCOSE 130*  124* 108*  BUN _0 CREATININE 1.06 1.09 1.09  CALCIUM 7.4* 7.3* 7.7*  PROT 5.1*  --  5.2*  ALBUMIN 2.7*  --  2.5*  AST 136*  --  68*  ALT 92*  --  65*  ALKPHOS 52  --  65  BILITOT 1.1  --  0.8  GFRNONAA >60 >60 >60  GFRAA >60 >60 >60  ANIONGAP _1 Hematology Recent Labs  Lab 01/04/18 0409  01/04/18 1621 01/05/18 0348 01/06/18 0421  WBC 11.0*  --   --  9.5 9.7  RBC 4.87  --   --  4.73 4.35  HGB 14.3   < > 12.6* 14.0 12.4*  HCT 42.4   < > 37.0* 41.1 40.2  MCV 87.1  --   --  86.9 92.4  MCH 29.4  --   --  29.6 28.5  MCHC 33.7  --   --  34.1 30.8  RDW 12.4  --   --  12.8 13.3  PLT 154  --   --  125* 91*   < > = values in this interval not displayed.    Cardiac Enzymes Recent Labs  Lab 01/03/18 1624 01/03/18 1858  01/04/18 0138 01/04/18 0409  TROPONINI 7.13*  6.82* 11.07* 13.06* 11.19*    Recent Labs  Lab 01/03/18 1315  TROPIPOC 0.39*     BNPNo results for input(s): BNP, PROBNP in the last 168 hours.   DDimer No results for input(s): DDIMER in the last 168 hours.   Lipid Panel     Component Value Date/Time   TRIG 161 (H) 01/03/2018 1624     Radiology    Dg Chest Port 1 View  Result Date: 01/05/2018 CLINICAL DATA:  Intubated, respiratory failure EXAM: PORTABLE CHEST 1 VIEW COMPARISON:  01/03/2018 chest radiograph. FINDINGS: Endotracheal tube tip is 5.5 cm above the carina. Enteric tube enters stomach with the tip not seen on this image. Inferior approach Swan-Ganz catheter terminates over the right hilum likely within the interlobar branch of the right pulmonary artery. Stable cardiomediastinal silhouette with normal heart size. No pneumothorax. No pleural effusion. Mild hazy infrahilar opacities in both lungs. IMPRESSION: 1. Inferior approach swan Ganz catheter terminates over the right hilum likely within the interlobar pulmonary artery branch. 2. Well-positioned endotracheal and enteric tubes. 3. Mild hazy infrahilar opacities in both lungs, favor atelectasis or mild edema. Electronically Signed   By: Ilona Sorrel M.D.   On: 01/05/2018 08:01    Cardiac Studies   Cath (11/2):  CULPRIT BIFURCATION LESION: Prox LAD lesion is 60% stenosed with 75% stenosed side branch in Ost 1st Diag. Prox LAD to Mid LAD lesion is 100% stenosed.  A drug-eluting stent was successfully placed (beginning proximal to diagonal branch) using a STENT SYNERGY DES 3X28. -Postdilated to 3.6 mm. Post intervention, there is a 0% residual stenosis in the LAD  Balloon angioplasty was performed on Ost 1st Diag using a BALLOON SAPPHIRE 2.0X12. Post intervention, the side branch was reduced to 20% residual stenosis.  ----------------------------------------------------------  Ost Ramus lesion is 65%  stenosed.  ----------------------------------------------------------  There is mild left ventricular systolic dysfunction. The left ventricular ejection fraction is 45-50% by visual estimate.  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  Successful PCI of LAD with DES & PTCA of ost-prox 1st Diag  EF ~45% with distal Anterior-anterolateral hypokinesis.  Minimally Elevated LVEDP & PCWP with normal RHC pressures -borderline but not true cardiogenic shock  Low Normal CO/CI (with Neosynephrine off).  Acute Respiratory Failure with Acidemia  Metabolic Acidosis - treated with 2 Amp NaHCO3  RECOMMENDATIONS: Admit to CVICU -PCCM managing ventilator and possible cooling protocol  Convert from [facility] to ticagrelor -[place] upon arrival to CCU  Maintain low-dose Neo-Synephrine for blood pressure support due to sedation  Defer decision making Re: Cooling protocol to PCCM. -->Anticipate right heart cath removal either later on today versus tomorrow morning.  High-dose statin, hold off on beta-blocker until we see urine toxicology screen.  Recommend uninterrupted dual antiplatelet therapy with Aspirin 66m daily and Ticagrelor 976mtwice dailyfor a minimum of 12 months (ACS - Class I recommendation).  Will review images with interventional colleagues to determine if ramus intermedius lesion should be treated prior to discharge or manage medically.   Diagnostic Diagram         Post-Intervention Diagram        Echo (01/04/18): Study Conclusions  - Left ventricle: The cavity size was mildly reduced. There was severe concentric hypertrophy. Systolic function was normal. The estimated  ejection fraction was in the range of 55% to 60%. Mild hypokinesis of the apical anterior myocardium; consistent with ischemia in the distribution of the distal left anterior descending coronary artery. Doppler parameters are consistent with abnormal left ventricular relaxation (grade 1 diastolic dysfunction). Acoustic contrast opacification revealed no evidence ofthrombus.   Patient Profile     [age].o. male with hypertension and no known cardiac history presented post cardiac arrest (defibx4, epi x6pta, placed on cooling protocol) secondary to cocaine use with a anterolateral and inferior st elevations. Code STEMI activated, patient sedated and intubated, and taken for emergent cath that found proximal to mid 100% LAD lesion resulting in pci of lad with des and ptca of ost-prox 1st diagonal.   Assessment & Plan    1. Acute anterolateral wall STEMI: EKG 11/5 showing normal sinus rhythm without significant st or t wave abnormalities. Echo with lvef 55-60%, g1dd, mild hypokinesis of apical anterior myocardium.   Continue aspirin 8144md, and ticagrelor 42m38md for 12 months. Lipid panel and A1C ordered. Started on atorvastatin 40mg28mliver enzymes have normalized.   2. 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 Utox (01/03/18) positive for cocaine, benzo, and thc. Counseled patient on the need for lifestyle modifications. Avoid beta blocker   3. Cardiogenic shock: Continue on levophed and wean to maintain MAP goal of 65mmh16mdded normal saline at 75ml/h11matient continues to be intubated. Critical care following.   4. Essential Hypertension: BP ranging 110-120/50-70s over past 24 hrs.   Vahini Lars Mageternal Medicine PGY2 Pager:3PNTIR:[phone], 8:20 AM     Patient seen and examined. Agree with assessment and plan.  Patient is currently waking up.  He opens eyes.  He is able to move  upper extremities and there is slight movement in the lower extremities.  He is now stable, in process of weaning and ultimately discontinuing levophed. PA pressures remained stable.  Will DC Swan-Ganz catheter.  Echo shows EF at 55 to 60% suggesting salvage of myocardium in the LAD territory.  ECG reveals complete resolution of significant anterior ST  elevation and does not show signs of prior MI.  LFTs have improved with resolution of shock.  Patient in addition to cocaine use has a significant history of EtOH consumption.  Need to follow LFTs and consider initiation of low-dose statin therapy.   Troy Sine, MD, [facility] 01/06/2018 12:58 PM

## 2018-01-06 NOTE — Progress Notes (Signed)
NAME:  Rodney Lloyd, MRN:  [id number], DOB:  1970/05/20, LOS: 3 ADMISSION DATE:  01/03/2018, CONSULTATION DATE:  01/03/2018 REFERRING MD:  EDP - [place], CHIEF COMPLAINT:  Cardiac arrest   Brief History   47 year old male with history of HTN who presents to PCCM in the ED after a VT arrest outside the hospital.  Bystander CPR was started and EMS arrived.  Total of 4 rounds of epi and 4 shocks delivered.  With ROSC patient was in sinus tach and was noted to have ST segment elevation and code STEMI was called.  Upon arrival, patient was sedated and intubated and was being taken to the cath lab for emergent cath.  No further history available at this time.  Past Medical History  HTN  Significant Hospital Events   11/2>>>STEMI to cath lab post arrest 11/4 >>> cooling over. Attempting to wake up. Still on pressors.  11/5 > Rewarmed. Off pressors. Following basic commands. Failed SBT immediately  Consults: date of consult/date signed off & final recs:  PCCM 11/2>>>  Procedures (surgical and bedside):  Cardiac cath 11/2 > A few areas of stenosis. Culprit lesion: Prox LAD lesion is 60% stenosed with 75% stenosed side branch in Ost 1st Diag. Prox LAD to Mid LAD lesion is 100% stenosed. Intervention: balloon angioplasty and DES  Significant Diagnostic Tests:  Echo 11/3 > EF 55-60%, Mild hypokinesis of the apical anterior myocardium; consistent with ischemia in the distribution of the distal left anterior descending coronary artery. Grade 1 diastolic dysfunction, no evidence of thrombus CT head 11/3 > Negative CT HEAD without contrast. Severe paranasal sinusitis. EEG 11/3 > burst suppression pattern. No seizure activity identified on limited time study.   Micro Data:  11/2>> Blood 11/2>> Respiratory 11/2>> PCR negative  Antimicrobials:  Unasyn 01/03/2018  Subjective/Interval:  Follows basic commands this morning. Failed wean immediately.   Objective   Blood pressure 126/72, pulse 83,  temperature 98.2 F (36.8 C), temperature source Core, resp. rate (!) 8, height 5\' 11"  (1.803 m), weight 102 kg, SpO2 100 %. PAP: (30-64)/(14-29) 31/15 CVP:  [8 mmHg-11 mmHg] 9 mmHg  Vent Mode: PRVC FiO2 (%):  [40 %] 40 % Set Rate:  [26 bmp] 26 bmp Vt Set:  [600 mL] 600 mL PEEP:  [5 cmH20] 5 cmH20 Plateau Pressure:  [21 cmH20-28 cmH20] 23 cmH20   Intake/Output Summary (Last 24 hours) at 01/06/2018 0904 Last data filed at 01/06/2018 0800 Gross per 24 hour  Intake 2762.97 ml  Output 2200 ml  Net 562.97 ml   Filed Weights   01/04/18 0558 01/05/18 0400 01/06/18 0400  Weight: 93.9 kg 96.9 kg 102 kg    Examination: General: middle aged male on vent in NAD HENT: [place]/AT, PERRL, no appreciable JVD Lungs: Clear bilateral breath sounds Cardiovascular: RRR, no MRG Abdomen: Soft, non-distended hypoactive.  Extremities: no acute deformity Neuro: Eyes open to voice. Wiggles toes to command. Follows no other commands.  Skin: Grossly intact.   Resolved Hospital Problem list     Assessment & Plan:   VT cardiac arrest: in the setting of myocardial infarction likely caused by existing CAD and cocaine use.       - Hypothermia protocol > now rewarmed.  - Stent placed in cath lab 11/2. - Cardiology primary: recommending DAPT and statin.    Cardiogenic shock: + 1.4 L since admission - Off pressors now that sedation is low/off.  - Ok to pull PA cath/sheath from CCM standpoint.   Acute respiratory  failure: - Full vent support - Failed SBT, decreased full support settings so repeat ABG in 1 hour.  - Titrate O2 for sat of 92-95%  - CXR in AM - Continue Unasyn > Sinusitis per CT, cannot rule out PNA. 7 day course. Stop date ordered.   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 Maintain RASS 0 to -1.   Hypoakalemia: improved - Trend BMET           Disposition / Summary of Today's Plan 01/06/18   Maintain in ICU on vent.  Slowly becoming purposeful, minimal command following, which is an improvement.   Diet: TF VAP protocol DVT prophylaxis: Heparin  GI prophylaxis: Pepcid  Hyperglycemia protocol Mobility: Bedrest Code Status: FC Family Communication: family updated bedside. They are pleased with his progress but understand he remains critically ill and has a guarded prognosis.   Labs   CBC: Recent Labs  Lab 01/03/18 1310  01/03/18 1624  01/04/18 0409  01/04/18 0809 01/04/18 1157 01/04/18 1621 01/05/18 0348 01/06/18 0421  WBC 11.8*  --  9.0  --  11.0*  --   --   --   --  9.5 9.7  HGB 15.3   < > 15.9   < > 14.3   < > 12.9* 12.9* 12.6* 14.0 12.4*  HCT 49.2   < > 50.1   < > 42.4   < > 38.0* 38.0* 37.0* 41.1 40.2  MCV 93.5  --  90.1  --  87.1  --   --   --   --  86.9 92.4  PLT 216  --  227  --  154  --   --   --   --  125* 91*   < > = values in this interval not displayed.    Basic Metabolic Panel: Recent Labs  Lab 01/04/18 0409  01/04/18 2002 01/05/18 0005 01/05/18 0348 01/05/18 0741 01/05/18 1048 01/05/18 1622 01/06/18 0421  NA 140   < > 140 140 139 141  --   --  140  K 3.0*   < > 3.1* 2.9* 2.7* 3.5  --   --  4.0  CL 113*   < > 112* 113* 111 116*  --   --  112*  CO2 21*  --  20* 21* 21* 20*  --   --  23  GLUCOSE 120*   < > 131* 133* 130* 124*  --   --  108*  BUN 12   < > 11 10 7 7   --   --  9  CREATININE 0.83   < > 0.99 1.10 1.06 1.09  --   --  1.09  CALCIUM 7.7*  --  7.6* 7.5* 7.4* 7.3*  --   --  7.7*  MG 1.7  --   --   --  2.2  --  2.0 2.2 2.1  PHOS <1.0*  --  2.0*  --  2.3*  --  2.6 3.5 3.2   < > = values in this interval not displayed.   GFR: Estimated Creatinine Clearance: 101.9 mL/min (by C-G formula based on SCr of 1.09 mg/dL). Recent Labs  Lab 01/03/18 1318 01/03/18 1624 01/04/18 0409 01/05/18 0348 01/05/18 0404 01/06/18 0421  WBC  --  9.0 11.0* 9.5  --  9.7  LATICACIDVEN 10.67* 3.3*  --   --  1.8  --     Liver Function Tests: Recent  Labs  Lab 01/05/18 0348  01/06/18 0421  AST 136* 68*  ALT 92* 65*  ALKPHOS 52 65  BILITOT 1.1 0.8  PROT 5.1* 5.2*  ALBUMIN 2.7* 2.5*   No results for input(s): LIPASE, AMYLASE in the last 168 hours. No results for input(s): AMMONIA in the last 168 hours.  ABG    Component Value Date/Time   PHART 7.330 (L) 01/03/2018 1442   PCO2ART 42.7 01/03/2018 1442   PO2ART 47.0 (L) 01/03/2018 1442   HCO3 22.5 01/03/2018 1442   TCO2 18 (L) 01/04/2018 1621   ACIDBASEDEF 3.0 (H) 01/03/2018 1442   O2SAT 89.1 01/04/2018 1410     Coagulation Profile: Recent Labs  Lab 01/03/18 1310 01/03/18 1624 01/04/18 0138  INR 1.09 1.49 1.19    Cardiac Enzymes: Recent Labs  Lab 01/03/18 1624 01/03/18 1858 01/04/18 0138 01/04/18 0409  TROPONINI 7.13*  6.82* 11.07* 13.06* 11.19*    HbA1C: No results found for: HGBA1C  CBG: Recent Labs  Lab 01/05/18 1551 01/05/18 1957 01/05/18 2046 01/05/18 2306 01/06/18 0426  GLUCAP 70 59* 149* 80 105*    Admitting History of Present Illness.   47 year old male with history of HTN who presents to PCCM in the ED after a VT arrest outside the hospital.  Bystander CPR was started and EMS arrived.  Total of 4 rounds of epi and 4 shocks delivered.  With ROSC patient was in sinus tach and was noted to have ST segment elevation and code STEMI was called.  Upon arrival, patient was sedated and intubated and was being taken to the cath lab for emergent cath.  CULPRIT BIFURCATION LESION: Prox LAD lesion is 60% stenosed with 75% stenosed side branch in Ost 1st Diag. Prox LAD to Mid LAD lesion is 100% stenosed. A drug-eluting stent was successfully placed (beginning proximal to diagonal branch) using a STENT SYNERGY DES 3X28. -Postdilated to 3.6 mm. Post intervention, there is a 0% residual stenosis in the LAD.   Review of Systems:   Unattainable  Past Medical History  He,  has a past medical history of Hypertension.   Surgical History    Past Surgical History:  Procedure Laterality  Date  . CORONARY/GRAFT ACUTE MI REVASCULARIZATION N/A 01/03/2018   Procedure: Coronary/Graft Acute MI Revascularization;  Surgeon: Marykay Lex, MD;  Location: [facility] INVASIVE CV LAB;  Service: Cardiovascular;  Laterality: N/A;  . RIGHT/LEFT HEART CATH AND CORONARY ANGIOGRAPHY N/A 01/03/2018   Procedure: RIGHT/LEFT HEART CATH AND CORONARY ANGIOGRAPHY;  Surgeon: Marykay Lex, MD;  Location: [facility] INVASIVE CV LAB;  Service: Cardiovascular;  Laterality: N/A;     Social History   Social History   Socioeconomic History  . Marital status: Divorced    Spouse name: Not on file  . Number of children: Not on file  . Years of education: Not on file  . Highest education level: Not on file  Occupational History  . Not on file  Social Needs  . Financial resource strain: Not on file  . Food insecurity:    Worry: Not on file    Inability: Not on file  . Transportation needs:    Medical: Not on file    Non-medical: Not on file  Tobacco Use  . Smoking status: Current Every Day Smoker  Substance and Sexual Activity  . Alcohol use: Yes  . Drug use: No  . Sexual activity: Not on file  Lifestyle  . Physical activity:    Days per week: Not on file  Minutes per session: Not on file  . Stress: Not on file  Relationships  . Social connections:    Talks on phone: Not on file    Gets together: Not on file    Attends religious service: Not on file    Active member of club or organization: Not on file    Attends meetings of clubs or organizations: Not on file    Relationship status: Not on file  . Intimate partner violence:    Fear of current or ex partner: Not on file    Emotionally abused: Not on file    Physically abused: Not on file    Forced sexual activity: Not on file  Other Topics Concern  . Not on file  Social History Narrative  . Not on file  ,  reports that he has been smoking. He does not have any smokeless tobacco history on file. He reports that he drinks alcohol. He reports  that he does not use drugs.   Family History   His family history includes Heart disease in his father; Hyperlipidemia in his mother.   Allergies No Known Allergies   Home Medications  Prior to Admission medications   Medication Sig Start Date End Date Taking? Authorizing Provider  acetaminophen (TYLENOL) 325 MG tablet Take 650 mg by mouth every 6 (six) hours as needed for mild pain.     [provider]  cephALEXin (KEFLEX) 500 MG capsule Take 1 capsule (500 mg total) by mouth 4 (four) times daily. 03/14/15   Pricilla Loveless, MD  diclofenac (VOLTAREN) 50 MG EC tablet Take 1 tablet (50 mg total) by mouth 2 (two) times daily. 04/20/15   Janne Napoleon, NP  hydrochlorothiazide (HYDRODIURIL) 25 MG tablet Take 1 tablet (25 mg total) by mouth daily. 02/15/15   Hoy Register, MD  methocarbamol (ROBAXIN) 500 MG tablet Take 1 tablet (500 mg total) by mouth 2 (two) times daily. 04/20/15   Janne Napoleon, NP  nicotine (NICODERM CQ - DOSED IN MG/24 HOURS) 14 mg/24hr patch Place 1 patch (14 mg total) onto the skin daily. Patient not taking: Reported on 03/14/2015 02/15/15   Hoy Register, MD    My critical care time excluding procedures: 35 mins  Joneen Roach, AGACNP-BC [facility] Pulmonary/Critical Care Pager [phone] or [phone]  01/06/2018 9:04 AM

## 2018-01-07 ENCOUNTER — Inpatient Hospital Stay (HOSPITAL_COMMUNITY): Payer: Self-pay

## 2018-01-07 DIAGNOSIS — I213 ST elevation (STEMI) myocardial infarction of unspecified site: Secondary | ICD-10-CM

## 2018-01-07 DIAGNOSIS — J690 Pneumonitis due to inhalation of food and vomit: Secondary | ICD-10-CM

## 2018-01-07 DIAGNOSIS — G9340 Encephalopathy, unspecified: Secondary | ICD-10-CM

## 2018-01-07 DIAGNOSIS — Z789 Other specified health status: Secondary | ICD-10-CM

## 2018-01-07 LAB — BASIC METABOLIC PANEL
Anion gap: 4 — ABNORMAL LOW (ref 5–15)
BUN: 11 mg/dL (ref 6–20)
CHLORIDE: 113 mmol/L — AB (ref 98–111)
CO2: 22 mmol/L (ref 22–32)
Calcium: 8.1 mg/dL — ABNORMAL LOW (ref 8.9–10.3)
Creatinine, Ser: 1.06 mg/dL (ref 0.61–1.24)
GFR calc non Af Amer: >60 mL/min (ref >60)
GLUCOSE: 148 mg/dL — AB (ref 70–99)
POTASSIUM: 3.2 mmol/L — AB (ref 3.5–5.1)
Sodium: 139 mmol/L (ref 135–145)

## 2018-01-07 LAB — HEPATIC FUNCTION PANEL
ALT: 45 U/L — ABNORMAL HIGH (ref 0–44)
AST: 54 U/L — ABNORMAL HIGH (ref 15–41)
Albumin: 2.3 g/dL — ABNORMAL LOW (ref 3.5–5.0)
Alkaline Phosphatase: 92 U/L (ref 38–126)
BILIRUBIN DIRECT: 0.3 mg/dL — AB (ref 0.0–0.2)
BILIRUBIN INDIRECT: 0.7 mg/dL (ref 0.3–0.9)
Total Bilirubin: 1 mg/dL (ref 0.3–1.2)
Total Protein: 5.5 g/dL — ABNORMAL LOW (ref 6.5–8.1)

## 2018-01-07 LAB — GLUCOSE, CAPILLARY
GLUCOSE-CAPILLARY: 150 mg/dL — AB (ref 70–99)
GLUCOSE-CAPILLARY: 103 mg/dL — AB (ref 70–99)
GLUCOSE-CAPILLARY: 98 mg/dL (ref 70–99)
Glucose-Capillary: 103 mg/dL — ABNORMAL HIGH (ref 70–99)
Glucose-Capillary: 138 mg/dL — ABNORMAL HIGH (ref 70–99)

## 2018-01-07 LAB — LIPID PANEL
CHOL/HDL RATIO: 4.9 ratio
Cholesterol: 117 mg/dL (ref 0–200)
HDL: 24 mg/dL — ABNORMAL LOW (ref >40)
LDL CALC: 67 mg/dL (ref 0–99)
TRIGLYCERIDES: 131 mg/dL (ref <150)
VLDL: 26 mg/dL (ref 0–40)

## 2018-01-07 LAB — RENAL FUNCTION PANEL
Albumin: 2.3 g/dL — ABNORMAL LOW (ref 3.5–5.0)
Anion gap: 6 (ref 5–15)
BUN: 9 mg/dL (ref 6–20)
CALCIUM: 8 mg/dL — AB (ref 8.9–10.3)
CO2: 21 mmol/L — ABNORMAL LOW (ref 22–32)
CREATININE: 1.07 mg/dL (ref 0.61–1.24)
Chloride: 112 mmol/L — ABNORMAL HIGH (ref 98–111)
GFR calc non Af Amer: >60 mL/min (ref >60)
Glucose, Bld: 135 mg/dL — ABNORMAL HIGH (ref 70–99)
Phosphorus: 2 mg/dL — ABNORMAL LOW (ref 2.5–4.6)
Potassium: 3.5 mmol/L (ref 3.5–5.1)
SODIUM: 139 mmol/L (ref 135–145)

## 2018-01-07 LAB — TRIGLYCERIDES: Triglycerides: 140 mg/dL (ref <150)

## 2018-01-07 LAB — DRUG PROFILE, UR, 9 DRUGS (LABCORP)
AMPHETAMINES, URINE: NEGATIVE ng/mL
BENZODIAZEPINE QUANT UR: NEGATIVE ng/mL
Barbiturate, Ur: NEGATIVE ng/mL
CANNABINOID QUANT UR: POSITIVE ng/mL — AB
COCAINE (METAB.): POSITIVE ng/mL — AB
Methadone Screen, Urine: NEGATIVE ng/mL
Opiate Quant, Ur: NEGATIVE ng/mL
Phencyclidine, Ur: NEGATIVE ng/mL
Propoxyphene, Urine: NEGATIVE ng/mL

## 2018-01-07 LAB — MAGNESIUM: MAGNESIUM: 2 mg/dL (ref 1.7–2.4)

## 2018-01-07 LAB — CBC
HEMATOCRIT: 35.4 % — AB (ref 39.0–52.0)
HEMOGLOBIN: 11.3 g/dL — AB (ref 13.0–17.0)
MCH: 28.3 pg (ref 26.0–34.0)
MCHC: 31.9 g/dL (ref 30.0–36.0)
MCV: 88.7 fL (ref 80.0–100.0)
Platelets: 87 10*3/uL — ABNORMAL LOW (ref 150–400)
RBC: 3.99 MIL/uL — ABNORMAL LOW (ref 4.22–5.81)
RDW: 13.2 % (ref 11.5–15.5)
WBC: 6.9 10*3/uL (ref 4.0–10.5)
nRBC: 0 % (ref 0.0–0.2)

## 2018-01-07 LAB — TROPONIN I: TROPONIN I: 3.3 ng/mL — AB (ref <0.03)

## 2018-01-07 MED ORDER — FOLIC ACID 1 MG PO TABS
1.0000 mg | ORAL_TABLET | Freq: Every day | ORAL | Status: DC
  Administered 2018-01-07 – 2018-01-08 (×2): 1 mg
  Filled 2018-01-07 (×2): qty 1

## 2018-01-07 MED ORDER — DEXTROSE 10 % IV SOLN: INTRAVENOUS | Status: DC

## 2018-01-07 MED ORDER — MIDAZOLAM HCL 2 MG/2ML IJ SOLN
INTRAMUSCULAR | Status: AC
  Administered 2018-01-07: 2 mg
  Filled 2018-01-07: qty 4

## 2018-01-07 MED ORDER — FENTANYL CITRATE (PF) 100 MCG/2ML IJ SOLN
INTRAMUSCULAR | Status: AC
  Administered 2018-01-07: 100 ug
  Filled 2018-01-07: qty 2

## 2018-01-07 MED ORDER — POTASSIUM CHLORIDE 10 MEQ/100ML IV SOLN
10.0000 meq | INTRAVENOUS | Status: AC
  Administered 2018-01-07 (×4): 10 meq via INTRAVENOUS
  Filled 2018-01-07 (×4): qty 100

## 2018-01-07 MED ORDER — ETOMIDATE 2 MG/ML IV SOLN
20.0000 mg | Freq: Once | INTRAVENOUS | Status: AC
  Administered 2018-01-07: 20 mg via INTRAVENOUS

## 2018-01-07 MED ORDER — PROPOFOL 1000 MG/100ML IV EMUL
0.0000 ug/kg/min | INTRAVENOUS | Status: DC
  Administered 2018-01-07: 20 ug/kg/min via INTRAVENOUS
  Administered 2018-01-07: 10 ug/kg/min via INTRAVENOUS
  Administered 2018-01-08 (×2): 35 ug/kg/min via INTRAVENOUS
  Filled 2018-01-07 (×4): qty 100

## 2018-01-07 MED ORDER — SODIUM CHLORIDE 0.9 % IV SOLN
0.5000 mg/h | INTRAVENOUS | Status: DC
  Filled 2018-01-07: qty 10

## 2018-01-07 MED ORDER — ROCURONIUM BROMIDE 50 MG/5ML IV SOLN
100.0000 mg | Freq: Once | INTRAVENOUS | Status: AC
  Administered 2018-01-07: 100 mg via INTRAVENOUS

## 2018-01-07 MED ORDER — FENTANYL 2500MCG IN NS 250ML (10MCG/ML) PREMIX INFUSION: 0.0000 ug/h | INTRAVENOUS | Status: DC

## 2018-01-07 MED FILL — Heparin Sod (Porcine)-NaCl IV Soln 1000 Unit/500ML-0.9%: INTRAVENOUS | Qty: 1000 | Status: AC

## 2018-01-07 NOTE — Significant Event (Signed)
Event noted as described in the nurses report He was responsive following this event. I reviewed rhythm strips which show severe sinus bradycardia We will change from Precedex to propofol Cardiology made aware, will repeat troponin x1  Ilya Ess V. Vassie Loll MD

## 2018-01-07 NOTE — Significant Event (Signed)
Rapid Response Event Note  Overview:  Pt began coughing during bath, became bradycardic. Pts eyes rolled back and became unresponsive. Pause with asystole to follow. Pt roused on own. Raised all 4 extremities. Followed commands. No palpable pulse lost.     Initial Focused Assessment:   Interventions:  Pt bagged with ambu for 1 minute, and replaced onto ventilator. Administered of Fentanyl IV. Precedex increased from 50mcg/kg/min to 1.35mcg/kg/min.   Plan of Care (if not transferred):  Event Summary:   at  12:28    at  12:35        Rodney Lloyd

## 2018-01-07 NOTE — Progress Notes (Addendum)
During bath, pt forcefully coughed ETT out of place. RT was called to bedside to assess. CCM paged to bedside to further assess pt. r/t low volume. See MD notes for reintubation procedure. Vital Signs stable during incident.

## 2018-01-07 NOTE — Progress Notes (Signed)
eLink Physician-Brief Progress Note Patient Name: Rodney Lloyd DOB: 1970-03-06 MRN: [id number]   Date of Service  01/07/2018  HPI/Events of Note  K+ = 3.2 and Creatinine = 1.06.  eICU Interventions  Will replace K+.      Intervention Category Major Interventions: Electrolyte abnormality - evaluation and management  Sommer,Steven Eugene 01/07/2018, 6:53 AM

## 2018-01-07 NOTE — Progress Notes (Signed)
Ventilator RR found on 20. Per Joneen Roach, NP note and vent order RR is to be set at 26. Rate turned back up to 26 until further notice.

## 2018-01-07 NOTE — Plan of Care (Signed)
  Problem: Elimination: Goal: Will not experience complications related to bowel motility Outcome: Progressing Goal: Will not experience complications related to urinary retention Outcome: Progressing   Problem: Cardiac: Goal: Ability to achieve and maintain adequate cardiopulmonary perfusion will improve Outcome: Progressing   Problem: Skin Integrity: Goal: Risk for impaired skin integrity will be minimized. Outcome: Progressing   Problem: Cardiovascular: Goal: Vascular access site(s) Level 0-1 will be maintained Outcome: Progressing   Problem: Nutrition: Goal: Adequate nutrition will be maintained Outcome: Not Progressing

## 2018-01-07 NOTE — Progress Notes (Signed)
200cc of Fentanyl wasted in sink witnessed by 2 RNs, Celenia S and Kalisa Girtman M. Leoti, Mitzi Hansen

## 2018-01-07 NOTE — Progress Notes (Addendum)
Progress Note  Patient Name: Rodney Lloyd Date of Encounter: 01/07/2018  Primary Cardiologist: No primary provider on file  Subjective   Patient was seen resting comfortably with significant other at bedside. The patient self extubated himself last night, but was re-intubated.  Inpatient Medications    Scheduled Meds: . aspirin  81 mg Per Tube Daily  . atorvastatin  40 mg Oral q1800  . chlorhexidine gluconate (MEDLINE KIT)  15 mL Mouth Rinse BID  . feeding supplement (VITAL HIGH PROTEIN)  1,000 mL Per Tube Q24H  . folic acid  1 mg Intravenous Daily  . heparin  5,000 Units Subcutaneous Q8H  . mouth rinse  15 mL Mouth Rinse 10 times per day  . nicotine  14 mg Transdermal Daily  . sodium chloride flush  3 mL Intravenous Q12H  . thiamine injection  100 mg Intravenous Daily  . ticagrelor  90 mg Per Tube BID   Continuous Infusions: . sodium chloride 10 mL/hr at 01/07/18 0700  . ampicillin-sulbactam (UNASYN) IV Stopped (01/07/18 0549)  . dexmedetomidine (PRECEDEX) IV infusion 0.8 mcg/kg/hr (01/07/18 0700)  . dextrose 30 mL/hr at 01/07/18 0700  . famotidine (PEPCID) IV Stopped (01/06/18 2222)  . fentaNYL infusion INTRAVENOUS Stopped (01/07/18 0252)  . midazolam (VERSED) infusion Stopped (01/07/18 0332)  . potassium chloride 10 mEq (01/07/18 0746)   PRN Meds: sodium chloride, acetaminophen, fentaNYL (SUBLIMAZE) injection, fentaNYL (SUBLIMAZE) injection, ondansetron (ZOFRAN) IV, sodium chloride flush   Vital Signs    Vitals:   01/07/18 0425 01/07/18 0500 01/07/18 0600 01/07/18 0700  BP: 98/66 (!) 101/53 (!) 81/50 103/64  Pulse: 70 70 69 71  Resp:  (!) 26 (!) 26 (!) 0  Temp:      TempSrc:      SpO2: 100% 100% 100% 97%  Weight:      Height:        Intake/Output Summary (Last 24 hours) at 01/07/2018 0804 Last data filed at 01/07/2018 0700 Gross per 24 hour  Intake 2971.47 ml  Output 1370 ml  Net 1601.47 ml    I/O since admission:   Filed Weights   01/05/18 0400  01/06/18 0400 01/07/18 0343  Weight: 96.9 kg 102 kg 97.8 kg    Telemetry    Sinus rhythm without significant st or t wave abnormalities - Personally Reviewed  ECG    ECG (independently read by me):pending  Physical Exam    BP 103/64   Pulse 71   Temp 97.7 F (36.5 C) (Core)   Resp (!) 0   Ht '5\' 11"'  (1.803 m)   Wt 97.8 kg Comment: subracted weight of pads  SpO2 97%   BMI 30.07 kg/m   Physical Exam  Constitutional: He appears well-developed and well-nourished. No distress.  HENT:  Head: Normocephalic and atraumatic.  Eyes: Conjunctivae are normal.  Cardiovascular: Normal rate, regular rhythm and normal heart sounds.  Respiratory: Effort normal and breath sounds normal. No respiratory distress.  Diffuse rhonchi in bilateral lung fields   GI: Soft. Bowel sounds are normal. He exhibits no distension. There is no tenderness.  Musculoskeletal: He exhibits no edema.  Neurological:  Patient intubated   Skin: He is not diaphoretic. No erythema.    Labs    Chemistry Recent Labs  Lab 01/05/18 0348 01/05/18 0741 01/06/18 0421 01/07/18 0435  NA 139 141 140 139  K 2.7* 3.5 4.0 3.2*  CL 111 116* 112* 113*  CO2 21* 20* 23 22  GLUCOSE 130* 124* 108* 148*  BUN  '7 7 9 11  ' CREATININE 1.06 1.09 1.09 1.06  CALCIUM 7.4* 7.3* 7.7* 8.1*  PROT 5.1*  --  5.2*  --   ALBUMIN 2.7*  --  2.5*  --   AST 136*  --  68*  --   ALT 92*  --  65*  --   ALKPHOS 52  --  65  --   BILITOT 1.1  --  0.8  --   GFRNONAA >60 >60 >60 >60  GFRAA >60 >60 >60 >60  ANIONGAP '7 5 5 ' 4*     Hematology Recent Labs  Lab 01/05/18 0348 01/06/18 0421 01/07/18 0435  WBC 9.5 9.7 6.9  RBC 4.73 4.35 3.99*  HGB 14.0 12.4* 11.3*  HCT 41.1 40.2 35.4*  MCV 86.9 92.4 88.7  MCH 29.6 28.5 28.3  MCHC 34.1 30.8 31.9  RDW 12.8 13.3 13.2  PLT 125* 91* 87*    Cardiac Enzymes Recent Labs  Lab 01/03/18 1624 01/03/18 1858 01/04/18 0138 01/04/18 0409  TROPONINI 7.13*  6.82* 11.07* 13.06* 11.19*    Recent  Labs  Lab 01/03/18 1315  TROPIPOC 0.39*     BNPNo results for input(s): BNP, PROBNP in the last 168 hours.   DDimer No results for input(s): DDIMER in the last 168 hours.    Lipid Panel     Component Value Date/Time   TRIG 161 (H) 01/03/2018 1624   Radiology    Dg Chest Port 1 View  Result Date: 01/07/2018 CLINICAL DATA:  Intubation EXAM: PORTABLE CHEST 1 VIEW COMPARISON:  01/05/2018 FINDINGS: Patient is rotated to the left. Endotracheal tube tip is at the level of the clavicular heads. Unchanged cardiomegaly. No focal airspace consolidation. The pulmonary arterial catheter has been removed. IMPRESSION: Endotracheal tube tip at the level of the clavicular heads. Electronically Signed   By: Ulyses Jarred M.D.   On: 01/07/2018 01:47    Cardiac Studies   Cath (11/2):  CULPRIT BIFURCATION LESION: Prox LAD lesion is 60% stenosed with 75% stenosed side branch in Ost 1st Diag. Prox LAD to Mid LAD lesion is 100% stenosed.  A drug-eluting stent was successfully placed (beginning proximal to diagonal branch) using a STENT SYNERGY DES 3X28. -Postdilated to 3.6 mm. Post intervention, there is a 0% residual stenosis in the LAD  Balloon angioplasty was performed on Ost 1st Diag using a BALLOON SAPPHIRE 2.0X12. Post intervention, the side branch was reduced to 20% residual stenosis.  ----------------------------------------------------------  Ost Ramus lesion is 65% stenosed.  ----------------------------------------------------------  There is mild left ventricular systolic dysfunction. The left ventricular ejection fraction is 45-50% by visual estimate.  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  Successful PCI  of LAD with DES & PTCA of ost-prox 1st Diag  EF ~45% with distal Anterior-anterolateral hypokinesis.  Minimally Elevated LVEDP & PCWP with normal RHC pressures -borderline but not true cardiogenic shock  Low Normal CO/CI (with Neosynephrine off).  Acute Respiratory Failure with Acidemia  Metabolic Acidosis - treated with 2 Amp NaHCO3  RECOMMENDATIONS: Admit to CVICU -PCCM managing ventilator and possible cooling protocol  Convert from [facility] to ticagrelor -[place] upon arrival to CCU  Maintain low-dose Neo-Synephrine for blood pressure support due to sedation  Defer decision making Re:  Cooling protocol to PCCM. -->Anticipate right heart cath removal either later on today versus tomorrow morning.  High-dose statin, hold off on beta-blocker until we see urine toxicology screen.  Recommend uninterrupted dual antiplatelet therapy with Aspirin 17m daily and Ticagrelor 961mtwice dailyfor a minimum of 12 months (ACS - Class I recommendation).  Will review images with interventional colleagues to determine if ramus intermedius lesion should be treated prior to discharge or manage medically.   Diagnostic Diagram         Post-Intervention Diagram        Echo (01/04/18): Study Conclusions  - Left ventricle: The cavity size was mildly reduced. There was severe concentric hypertrophy. Systolic function was normal. The estimated ejection fraction was in the range of 55% to 60%. Mild hypokinesis of the apical anterior myocardium; consistent with ischemia in the distribution of the distal left anterior descending coronary artery. Doppler parameters are consistent with abnormal left ventricular relaxation (grade 1 diastolic dysfunction). Acoustic contrast opacification revealed no evidence ofthrombus.   Patient Profile     [age].o. male with hypertension and no known cardiac history presented post cardiac arrest (defibx4, epi x6pta, placed on  cooling protocol) secondary to cocaine use with a anterolateral and inferior st elevations. Code STEMI activated, patient sedated and intubated, and taken for emergent cath that found proximal to mid 100% LAD lesion resulting in pci of lad with des and ptca of ost-prox 1st diagonal.   Assessment & Plan    1.Acute anterolateral wall STEMI: EKG 11/6 pending. Echo showing ef of 55-60% and therefore pump function remains normal.   -Continue aspirin 8163md -Ticagrelor 69m18md for 12 months -A1C=5.7 -Discontinued swan ganz catheter as pa pressure were stable -Lipid panel pending  -Atorvastatin 40mg69mll review hepatic function panel to trend liver enzymes    2. 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 Utox (01/03/18) positive for cocaine, benzo, and thc. Counseled patient on the need for lifestyle modifications. Avoid beta blocker.   Suggested to patient's significant other to discuss substance abuse rehab options as it has been found that starting a structured program prior to discharge has better long term effects.   3. Cardiogenic shock: Levophed discontinued we will try to wean off of vent today. Critical care following. Patient to get off rewarming today 11/6  4.Essential Hypertension: BP ranging 80-120/60-80s over past 24 hrs.   5.  Aspiration pneumonia: Unasyn 5 days  VahinLars MageInternal Medicine PGY2 PagerMHWKG:[phone]/2019, 8:05 AM    Patient seen and examined. Agree with assessment and plan. Day 4 s/p VT/VF arrest following cocaine with STEMI. Events of last night noted; pt self extubated himself after a coughing spel;  Required emergent re-intubation.  Now sedated back on vent PEEP 4, FiO2 40%, TV 600. ECG is normal without signs of MI. Stable hemodynamics off levophed with BP 108 s585olic.  R groin site stable; swan pulled yesterday.    ThomaTroy Sine FACC [facility]/2019 9:10  AM

## 2018-01-07 NOTE — Progress Notes (Signed)
RT called to room to assess ETT dislodgement and low VT.  Audible cuff leak noted. ETT currently at 19 cm, previously at 25 cm.  Cuff deflated and attempted to reposition ETT. BBS absent. Patient not receiving volume. Patient vomited.  ETT removed, bag mask valve used to provide ventilation. CCM at bedside to reintubate. 8.0 cm ETT placed, positive color on ETCO2, BBS noted. Chest xray confirmed.

## 2018-01-07 NOTE — Procedures (Signed)
Name: Rodney Lloyd MRN: [id number] DOB: 27-May-1970   PROCEDURE NOTE  Procedure:  Endotracheal intubation.  Unplanned extubation emergency reintubation  Indication:  Acute respiratory failure  Consent:  Consent was implied due to the emergency nature of the procedure.  Anesthesia:  A total of 10 mg of Etomidate was given intravenously.  Procedure summary:  Appropriate equipment was assembled. The patient was identified as Rodney Lloyd and safety timeout was performed. The patient was placed supine, with head in sniffing position. After adequate level of anesthesia was achieved, a 4 MAC blade was inserted into the oropharynx and the vocal cords were visualized. A 8.0 endotracheal tube was inserted without difficulty and visualized going through the vocal cords. The stylette was removed and cuff inflated. Colorimetric change was noted on the CO2 meter. Breath sounds were heard over both lung fields equally. ETT was secured at 25 lip line.  Post procedure chest xray was ordered.  Complications:  No immediate complications were noted.  Hemodynamic parameters and oxygenation remained stable throughout the procedure.     [facility] Pager: [phone]  01/07/2018, 1:33 AM

## 2018-01-07 NOTE — Progress Notes (Addendum)
NAME:  Rodney Lloyd, MRN:  [id number], DOB:  1970-12-17, LOS: 4 ADMISSION DATE:  01/03/2018, CONSULTATION DATE:  01/03/2018 REFERRING MD:  EDP - [place], CHIEF COMPLAINT:  Cardiac arrest   Brief History   48 year old male with history of HTN who presents to PCCM in the ED after a VT arrest outside the hospital.  Bystander CPR was started and EMS arrived.  Total of 4 rounds of epi and 4 shocks delivered.  With ROSC patient was in sinus tach and was noted to have ST segment elevation and code STEMI was called.  Upon arrival, patient was sedated and intubated and was being taken to the cath lab for emergent cath. UDS positive for cocaine, THC.  Past Medical History  HTN   Significant Hospital Events   11/2>>>STEMI to cath lab post arrest 11/4 >>> cooling over. Attempting to wake up. Still on pressors.  11/5 > Rewarmed. Off pressors. Following basic commands. Failed SBT immediately.    Consults: date of consult/date signed off & final recs:  PCCM 11/2>>>  Procedures (surgical and bedside):  Cardiac cath 11/2 > A few areas of stenosis. Culprit lesion: Prox LAD lesion is 60% stenosed with 75% stenosed side branch in Ost 1st Diag. Prox LAD to Mid LAD lesion is 100% stenosed. Intervention: balloon angioplasty and DES  11/2 ETT >>   Significant Diagnostic Tests:  Echo 11/3 > EF 55-60%, Mild hypokinesis of the apical anterior myocardium; consistent with ischemia in the distribution of the distal left anterior descending coronary artery. Grade 1 diastolic dysfunction, no evidence of thrombus CT head 11/3 > Negative CT HEAD without contrast. Severe paranasal sinusitis. EEG 11/3 > burst suppression pattern. No seizure activity identified on limited time study.   Micro Data:  11/2 BC x 2 >> ngtd 11/2 Respiratory >> few group B strep, S. Agalactiae isolated 11/2 MRSA PCR >> negative  Antimicrobials:  Unasyn 11/2 >>  Subjective/Interval:  Reintubated after unplanned extubation last evening  after bath and coughing episode followed by one episode of vomiting.  Tf on hold, OGT just replaced Has been moving all extremities per RN but not following commands Remains on precedex 1 mcg/kg/min  Objective   Blood pressure 103/64, pulse 71, temperature 97.7 F (36.5 C), temperature source Core, resp. rate (!) 26, height 5\' 11"  (1.803 m), weight 97.8 kg, SpO2 98 %. PAP: (30-48)/(14-24) 48/24 CVP:  [9 mmHg] 9 mmHg  Vent Mode: PRVC FiO2 (%):  [40 %] 40 % Set Rate:  [26 bmp] 26 bmp Vt Set:  [600 mL] 600 mL PEEP:  [5 cmH20] 5 cmH20 Pressure Support:  [10 cmH20] 10 cmH20 Plateau Pressure:  [17 cmH20-21 cmH20] 19 cmH20   Intake/Output Summary (Last 24 hours) at 01/07/2018 0825 Last data filed at 01/07/2018 0700 Gross per 24 hour  Intake 2971.47 ml  Output 1370 ml  Net 1601.47 ml   Filed Weights   01/05/18 0400 01/06/18 0400 01/07/18 0343  Weight: 96.9 kg 102 kg 97.8 kg    Examination: General:  Adult male on MV in NAD HEENT: MM pink/moist, pupils pinpoint, ETT 8 at 24, OGT Neuro: sedated, does not f/c CV: NS 71, +2 pulses, R groin site dressing c/d/soft PULM: even/non-labored on full MV support, coarse BS, no wheeze GI: soft, +BS, cooling pads in place Extremities: warm/dry, no LE edema  Skin: no rashes   Resolved Hospital Problem list     Assessment & Plan:   VT cardiac arrest: in the setting of myocardial infarction  likely caused by existing CAD and cocaine use.   Under went cardiac cath with balloon agioplasty and DES to LAD 11/2. - Cardiology primary, ASA, lipitor, brilinta per cards     Cardiogenic shock:  - resolved, hemodynamically stable   Acute respiratory failure Presumed aspiration - CXR 11/6 reviewed- ETT since advanced 1 cm, slightly worse right lower atelectasis vs inflitrate - Full vent support  - No increased FiO2 requirements, will repeat SBT this am, hopeful to extubate if mental status allows - Titrate O2 for sat of 92-95%  - trend CXR -  Continue Unasyn, for 5 days of tx, WBC remains wnl   Acute anoxic encephalopathy Substance abuse - Hypothermia protocol > now rewarmed, pads to be removed at noon today - Neuro checks ongoing - minimize sedation precedex/ prn fentanyl as able - daily thiamine/ folic acid  Hypokalemia - s/p replete this am  - Trending BMET  Normocytic anemia, Hgb 12.4 -11.3 - trend CBC -  No current signs of bleeding  Hypoglycemia - TF on hold, hopeful to extubate, if not will restart - continue D10 30 for now with CBG q 4           Disposition / Summary of Today's Plan 01/07/18   Minimize sedation and monitor neuro status Reattempt SBT this am  Diet: TF- currently on hold, if not extubated will restart VAP protocol DVT prophylaxis: Heparin  GI prophylaxis: Pepcid  Hyperglycemia protocol Mobility: Bedrest Code Status: FC Family Communication: Girlfriend (of many years), Tiffany, at bedside and updated on plan of care for today.  Labs   CBC: Recent Labs  Lab 01/03/18 1624  01/04/18 0409  01/04/18 1157 01/04/18 1621 01/05/18 0348 01/06/18 0421 01/07/18 0435  WBC 9.0  --  11.0*  --   --   --  9.5 9.7 6.9  HGB 15.9   < > 14.3   < > 12.9* 12.6* 14.0 12.4* 11.3*  HCT 50.1   < > 42.4   < > 38.0* 37.0* 41.1 40.2 35.4*  MCV 90.1  --  87.1  --   --   --  86.9 92.4 88.7  PLT 227  --  154  --   --   --  125* 91* 87*   < > = values in this interval not displayed.    Basic Metabolic Panel: Recent Labs  Lab 01/05/18 0005 01/05/18 0348 01/05/18 0741 01/05/18 1048 01/05/18 1622 01/06/18 0421 01/06/18 1639 01/07/18 0435  NA 140 139 141  --   --  140  --  139  K 2.9* 2.7* 3.5  --   --  4.0  --  3.2*  CL 113* 111 116*  --   --  112*  --  113*  CO2 21* 21* 20*  --   --  23  --  22  GLUCOSE 133* 130* 124*  --   --  108*  --  148*  BUN 10 7 7   --   --  9  --  11  CREATININE 1.10 1.06 1.09  --   --  1.09  --  1.06  CALCIUM 7.5* 7.4* 7.3*  --   --  7.7*  --  8.1*  MG  --  2.2  --  2.0  2.2 2.1 1.9  --   PHOS  --  2.3*  --  2.6 3.5 3.2 2.6  --    GFR: Estimated Creatinine Clearance: 102.7 mL/min (by C-G formula based on SCr of 1.06  mg/dL). Recent Labs  Lab 01/03/18 1318 01/03/18 1624 01/04/18 0409 01/05/18 0348 01/05/18 0404 01/06/18 0421 01/07/18 0435  WBC  --  9.0 11.0* 9.5  --  9.7 6.9  LATICACIDVEN 10.67* 3.3*  --   --  1.8  --   --     Liver Function Tests: Recent Labs  Lab 01/05/18 0348 01/06/18 0421  AST 136* 68*  ALT 92* 65*  ALKPHOS 52 65  BILITOT 1.1 0.8  PROT 5.1* 5.2*  ALBUMIN 2.7* 2.5*   No results for input(s): LIPASE, AMYLASE in the last 168 hours. No results for input(s): AMMONIA in the last 168 hours.  ABG    Component Value Date/Time   PHART 7.312 (L) 01/06/2018 1035   PCO2ART 44.8 01/06/2018 1035   PO2ART 221 (H) 01/06/2018 1035   HCO3 22.0 01/06/2018 1035   TCO2 18 (L) 01/04/2018 1621   ACIDBASEDEF 3.3 (H) 01/06/2018 1035   O2SAT 98.8 01/06/2018 1035     Coagulation Profile: Recent Labs  Lab 01/03/18 1310 01/03/18 1624 01/04/18 0138  INR 1.09 1.49 1.19    Cardiac Enzymes: Recent Labs  Lab 01/03/18 1624 01/03/18 1858 01/04/18 0138 01/04/18 0409  TROPONINI 7.13*  6.82* 11.07* 13.06* 11.19*    HbA1C: Hgb A1c MFr Bld  Date/Time Value Ref Range Status  01/06/2018 10:36 AM 5.7 (H) 4.8 - 5.6 % Final    Comment:    (NOTE) Pre diabetes:          5.7%-6.4% Diabetes:              >6.4% Glycemic control for   <7.0% adults with diabetes     CBG: Recent Labs  Lab 01/06/18 0747 01/06/18 1153 01/06/18 1554 01/06/18 2003 01/06/18 2333  GLUCAP 86 75 97 85 128*    Admitting History of Present Illness.   47 year old male with history of HTN who presents to PCCM in the ED after a VT arrest outside the hospital.  Bystander CPR was started and EMS arrived.  Total of 4 rounds of epi and 4 shocks delivered.  With ROSC patient was in sinus tach and was noted to have ST segment elevation and code STEMI was called.   Upon arrival, patient was sedated and intubated and was being taken to the cath lab for emergent cath.  CULPRIT BIFURCATION LESION: Prox LAD lesion is 60% stenosed with 75% stenosed side branch in Ost 1st Diag. Prox LAD to Mid LAD lesion is 100% stenosed. A drug-eluting stent was successfully placed (beginning proximal to diagonal branch) using a STENT SYNERGY DES 3X28. -Postdilated to 3.6 mm. Post intervention, there is a 0% residual stenosis in the LAD.    My critical care time excluding procedures: 35 mins  Posey Boyer, AGACNP-BC [facility] Pgr: [phone] or if no answer [phone] 01/07/2018, 8:50 AM

## 2018-01-07 NOTE — Progress Notes (Signed)
Called after patient had apparent prolonged bradycardia arrhythmic event.  According to the nurse the patient had been spearing seeing a significant paroxysm of coughing.  Patient was not on any negative chronotropic medications.  Suspect significant vagal etiology to the prolonged bradycardia.  No seizure activity.  The patient promptly returned to sinus rhythm and was appropriately responsive following the event.  He has been maintaining sinus rhythm without ectopy. Blood pressure is 112/68, no ectopy and no ST-T changes.  Current hemodynamics stable.  Agree with changing from Precedex to propofol.  Nicki Guadalajara, MD

## 2018-01-08 ENCOUNTER — Inpatient Hospital Stay (HOSPITAL_COMMUNITY): Payer: Self-pay

## 2018-01-08 ENCOUNTER — Other Ambulatory Visit: Payer: Self-pay

## 2018-01-08 LAB — CBC
HCT: 35.4 % — ABNORMAL LOW (ref 39.0–52.0)
HCT: 39.8 % (ref 39.0–52.0)
HEMOGLOBIN: 12.8 g/dL — AB (ref 13.0–17.0)
Hemoglobin: 11.5 g/dL — ABNORMAL LOW (ref 13.0–17.0)
MCH: 29.2 pg (ref 26.0–34.0)
MCH: 28.6 pg (ref 26.0–34.0)
MCHC: 32.5 g/dL (ref 30.0–36.0)
MCHC: 32.2 g/dL (ref 30.0–36.0)
MCV: 89.8 fL (ref 80.0–100.0)
MCV: 89 fL (ref 80.0–100.0)
NRBC: 0 % (ref 0.0–0.2)
PLATELETS: 111 10*3/uL — AB (ref 150–400)
Platelets: 134 10*3/uL — ABNORMAL LOW (ref 150–400)
RBC: 3.94 MIL/uL — AB (ref 4.22–5.81)
RBC: 4.47 MIL/uL (ref 4.22–5.81)
RDW: 13.1 % (ref 11.5–15.5)
RDW: 13.2 % (ref 11.5–15.5)
WBC: 6 10*3/uL (ref 4.0–10.5)
WBC: 7.2 10*3/uL (ref 4.0–10.5)
nRBC: 0 % (ref 0.0–0.2)

## 2018-01-08 LAB — POCT I-STAT 3, ART BLOOD GAS (G3+)
ACID-BASE DEFICIT: 1 mmol/L (ref 0.0–2.0)
BICARBONATE: 21.4 mmol/L (ref 20.0–28.0)
O2 SAT: 97 %
PCO2 ART: 29.9 mmHg — AB (ref 32.0–48.0)
PO2 ART: 86 mmHg (ref 83.0–108.0)
Patient temperature: 38.1
TCO2: 22 mmol/L (ref 22–32)
pH, Arterial: 7.467 — ABNORMAL HIGH (ref 7.350–7.450)

## 2018-01-08 LAB — GLUCOSE, CAPILLARY
GLUCOSE-CAPILLARY: 76 mg/dL (ref 70–99)
Glucose-Capillary: 95 mg/dL (ref 70–99)
Glucose-Capillary: 80 mg/dL (ref 70–99)
Glucose-Capillary: 97 mg/dL (ref 70–99)
Glucose-Capillary: 81 mg/dL (ref 70–99)
Glucose-Capillary: 82 mg/dL (ref 70–99)

## 2018-01-08 LAB — MAGNESIUM
MAGNESIUM: 2 mg/dL (ref 1.7–2.4)
Magnesium: 1.9 mg/dL (ref 1.7–2.4)

## 2018-01-08 LAB — RENAL FUNCTION PANEL
ALBUMIN: 2.5 g/dL — AB (ref 3.5–5.0)
Anion gap: 10 (ref 5–15)
BUN: 11 mg/dL (ref 6–20)
CALCIUM: 8.4 mg/dL — AB (ref 8.9–10.3)
CHLORIDE: 112 mmol/L — AB (ref 98–111)
CO2: 19 mmol/L — ABNORMAL LOW (ref 22–32)
CREATININE: 1.2 mg/dL (ref 0.61–1.24)
Glucose, Bld: 92 mg/dL (ref 70–99)
PHOSPHORUS: 2.3 mg/dL — AB (ref 2.5–4.6)
Potassium: 3.4 mmol/L — ABNORMAL LOW (ref 3.5–5.1)
Sodium: 141 mmol/L (ref 135–145)

## 2018-01-08 LAB — BASIC METABOLIC PANEL
Anion gap: 18 — ABNORMAL HIGH (ref 5–15)
BUN: 10 mg/dL (ref 6–20)
CHLORIDE: 102 mmol/L (ref 98–111)
CO2: 17 mmol/L — ABNORMAL LOW (ref 22–32)
Calcium: 8.2 mg/dL — ABNORMAL LOW (ref 8.9–10.3)
Creatinine, Ser: 1.26 mg/dL — ABNORMAL HIGH (ref 0.61–1.24)
GFR calc Af Amer: >60 mL/min (ref >60)
GFR calc non Af Amer: >60 mL/min (ref >60)
GLUCOSE: 94 mg/dL (ref 70–99)
Potassium: 3.5 mmol/L (ref 3.5–5.1)
Sodium: 137 mmol/L (ref 135–145)

## 2018-01-08 LAB — CULTURE, BLOOD (ROUTINE X 2)
CULTURE: NO GROWTH
Culture: NO GROWTH
SPECIAL REQUESTS: ADEQUATE
Special Requests: ADEQUATE

## 2018-01-08 LAB — TROPONIN I: TROPONIN I: 1.14 ng/mL — AB (ref <0.03)

## 2018-01-08 MED ORDER — ACETAMINOPHEN 10 MG/ML IV SOLN
1000.0000 mg | Freq: Once | INTRAVENOUS | Status: AC
  Administered 2018-01-08: 1000 mg via INTRAVENOUS
  Filled 2018-01-08: qty 100

## 2018-01-08 MED ORDER — FUROSEMIDE 10 MG/ML IJ SOLN: 40.0000 mg | Freq: Once | INTRAMUSCULAR | Status: DC

## 2018-01-08 MED ORDER — VITAMIN B-1 100 MG PO TABS: 100.0000 mg | ORAL_TABLET | Freq: Every day | ORAL | Status: DC

## 2018-01-08 MED ORDER — FOLIC ACID 1 MG PO TABS: 1.0000 mg | ORAL_TABLET | Freq: Every day | ORAL | Status: DC

## 2018-01-08 MED ORDER — RACEPINEPHRINE HCL 2.25 % IN NEBU
0.5000 mL | INHALATION_SOLUTION | Freq: Once | RESPIRATORY_TRACT | Status: AC
  Administered 2018-01-08: 0.5 mL via RESPIRATORY_TRACT

## 2018-01-08 MED ORDER — HYDRALAZINE HCL 20 MG/ML IJ SOLN
10.0000 mg | INTRAMUSCULAR | Status: DC | PRN
  Administered 2018-01-08: 20 mg via INTRAVENOUS
  Administered 2018-01-08: 10 mg via INTRAVENOUS
  Filled 2018-01-08 (×2): qty 1

## 2018-01-08 MED ORDER — POTASSIUM CHLORIDE 10 MEQ/100ML IV SOLN
10.0000 meq | INTRAVENOUS | Status: AC
  Administered 2018-01-09 (×4): 10 meq via INTRAVENOUS
  Filled 2018-01-08 (×4): qty 100

## 2018-01-08 MED ORDER — PIPERACILLIN-TAZOBACTAM 3.375 G IVPB
3.3750 g | Freq: Three times a day (TID) | INTRAVENOUS | Status: DC
  Administered 2018-01-08 – 2018-01-11 (×9): 3.375 g via INTRAVENOUS
  Filled 2018-01-08 (×11): qty 50

## 2018-01-08 MED ORDER — NITROGLYCERIN 0.4 MG SL SUBL
0.4000 mg | SUBLINGUAL_TABLET | SUBLINGUAL | Status: DC | PRN
  Filled 2018-01-08: qty 1

## 2018-01-08 MED ORDER — ALBUTEROL SULFATE (2.5 MG/3ML) 0.083% IN NEBU
2.5000 mg | INHALATION_SOLUTION | RESPIRATORY_TRACT | Status: DC | PRN
  Administered 2018-01-08: 2.5 mg via RESPIRATORY_TRACT

## 2018-01-08 MED ORDER — ORAL CARE MOUTH RINSE
15.0000 mL | Freq: Two times a day (BID) | OROMUCOSAL | Status: DC
  Administered 2018-01-10 – 2018-01-13 (×4): 15 mL via OROMUCOSAL

## 2018-01-08 MED ORDER — VANCOMYCIN HCL 10 G IV SOLR
1250.0000 mg | Freq: Two times a day (BID) | INTRAVENOUS | Status: DC
  Administered 2018-01-08: 1250 mg via INTRAVENOUS
  Filled 2018-01-08 (×2): qty 1250

## 2018-01-08 MED ORDER — POTASSIUM CHLORIDE 20 MEQ/15ML (10%) PO SOLN: 20.0000 meq | ORAL | Status: DC

## 2018-01-08 MED ORDER — MAGNESIUM SULFATE 2 GM/50ML IV SOLN
2.0000 g | Freq: Once | INTRAVENOUS | Status: AC
  Administered 2018-01-09: 2 g via INTRAVENOUS
  Filled 2018-01-08: qty 50

## 2018-01-08 MED ORDER — NITROGLYCERIN IN D5W 200-5 MCG/ML-% IV SOLN
2.0000 ug/min | INTRAVENOUS | Status: DC
  Administered 2018-01-08: 20 ug/min via INTRAVENOUS
  Administered 2018-01-09: 200 ug/min via INTRAVENOUS
  Administered 2018-01-09: 60 ug/min via INTRAVENOUS
  Administered 2018-01-09: 200 ug/min via INTRAVENOUS
  Administered 2018-01-10: 50 ug/min via INTRAVENOUS
  Filled 2018-01-08 (×6): qty 250

## 2018-01-08 MED ORDER — FUROSEMIDE 10 MG/ML IJ SOLN
40.0000 mg | Freq: Once | INTRAMUSCULAR | Status: AC
  Administered 2018-01-08: 40 mg via INTRAVENOUS
  Filled 2018-01-08: qty 4

## 2018-01-08 MED ORDER — RACEPINEPHRINE HCL 2.25 % IN NEBU
INHALATION_SOLUTION | RESPIRATORY_TRACT | Status: AC
  Administered 2018-01-08: 0.5 mL via RESPIRATORY_TRACT
  Filled 2018-01-08: qty 0.5

## 2018-01-08 MED ORDER — ALBUTEROL SULFATE (2.5 MG/3ML) 0.083% IN NEBU
INHALATION_SOLUTION | RESPIRATORY_TRACT | Status: AC
  Administered 2018-01-08: 2.5 mg via RESPIRATORY_TRACT
  Filled 2018-01-08: qty 3

## 2018-01-08 MED ORDER — POTASSIUM CHLORIDE 20 MEQ/15ML (10%) PO SOLN
20.0000 meq | ORAL | Status: AC
  Administered 2018-01-08: 20 meq
  Filled 2018-01-08: qty 15

## 2018-01-08 NOTE — Progress Notes (Signed)
Pharmacy Antibiotic Note  Rodney Lloyd is a 47 y.o. male admitted on 01/03/2018 with VT arrest and initially managed with a five-day course of Unasyn for possible aspiration.  Pharmacy has been consulted for vancomycin and Zosyn dosing with worsening respiratory status and concern for sepsis. Pt was initially on TTM to 33 degrees but has since been rewarmed.  Plan: Zosyn 3.375g IV q8h EI Vancomycin 1250mg  IV q12h Monitor LOT, renal function Vancomycin level as indicated  Height: 5\' 11"  (180.3 cm) Weight: 228 lb 13.4 oz (103.8 kg) IBW/kg (Calculated) : 75.3  Temp (24hrs), Avg:99.8 F (37.7 C), Min:98.2 F (36.8 C), Max:101.1 F (38.4 C)  Recent Labs  Lab 01/03/18 1318  01/03/18 1624  01/04/18 0409  01/05/18 0348 01/05/18 0404 01/05/18 0741 01/06/18 0421 01/07/18 0435 01/07/18 1355 01/08/18 0348  WBC  --   --  9.0  --  11.0*  --  9.5  --   --  9.7 6.9  --  6.0  CREATININE  --    < > 1.23  1.24  1.20   < > 0.83   < > 1.06  --  1.09 1.09 1.06 1.07 1.20  LATICACIDVEN 10.67*  --  3.3*  --   --   --   --  1.8  --   --   --   --   --    < > = values in this interval not displayed.    Estimated Creatinine Clearance: 93.3 mL/min (by C-G formula based on SCr of 1.2 mg/dL).    No Known Allergies  Antimicrobials this admission: Unasyn 11/2 >> 11/7 Vancomycin 11/7 >>  Zosyn 11/7 >>  Dose adjustments this admission: none  Microbiology results: 11/2 MRSA: negative 11/2 BCx: negative 11/2 Trach aspirate: few GBS  Thank you for allowing pharmacy to be a part of this patient's care.  Fredonia Highland, PharmD, BCPS Clinical Pharmacist [phone] Please check AMION for all [facility] Pharmacy numbers 01/08/2018

## 2018-01-08 NOTE — Progress Notes (Signed)
[facility] ADULT ICU REPLACEMENT PROTOCOL FOR AM LAB REPLACEMENT ONLY  The patient does apply for the [facility] Adult ICU Electrolyte Replacment Protocol based on the criteria listed below:   1. Is GFR >/= 40 ml/min? Yes.    Patient's GFR today is >60 2. Is urine output >/= 0.5 ml/kg/hr for the last 6 hours? Yes.   Patient's UOP is 1 ml/kg/hr 3. Is BUN < 60 mg/dL? Yes.    Patient's BUN today is 11 4. Abnormal electrolyte(s): K-3.4 5. Ordered repletion with: per protocol 6. If a panic level lab has been reported, has the CCM MD in charge been notified? Yes.  .   Physician:  Dr. Brett Fairy, [place] Boos 01/08/2018 6:56 AM

## 2018-01-08 NOTE — Progress Notes (Signed)
NAME:  Rodney Lloyd, MRN:  [id number], DOB:  08/13/1970, LOS: 5 ADMISSION DATE:  01/03/2018, CONSULTATION DATE:  01/03/2018 REFERRING MD:  EDP - [place], CHIEF COMPLAINT:  Cardiac arrest   Brief History   47 year old male with history of HTN who presents to PCCM in the ED after a VT arrest outside the hospital.  Bystander CPR was started and EMS arrived.  Total of 4 rounds of epi and 4 shocks delivered.  With ROSC patient was in sinus tach and was noted to have ST segment elevation and code STEMI was called.  Upon arrival, patient was sedated and intubated and was being taken to the cath lab for emergent cath. UDS positive for cocaine, THC.  Past Medical History  HTN   Significant Hospital Events   11/2>>>STEMI to cath lab post arrest 11/4 >>> cooling over. Attempting to wake up. Still on pressors.  11/5 > Rewarmed. Off pressors. Following basic commands. Failed SBT immediately.    Consults: date of consult/date signed off & final recs:  PCCM 11/2>>>  Procedures (surgical and bedside):  Cardiac cath 11/2 > A few areas of stenosis. Culprit lesion: Prox LAD lesion is 60% stenosed with 75% stenosed side branch in Ost 1st Diag. Prox LAD to Mid LAD lesion is 100% stenosed. Intervention: balloon angioplasty and DES  11/2 ETT >>   Significant Diagnostic Tests:  Echo 11/3 > EF 55-60%, Mild hypokinesis of the apical anterior myocardium; consistent with ischemia in the distribution of the distal left anterior descending coronary artery. Grade 1 diastolic dysfunction, no evidence of thrombus CT head 11/3 > Negative CT HEAD without contrast. Severe paranasal sinusitis. EEG 11/3 > burst suppression pattern. No seizure activity identified on limited time study.   Micro Data:  11/2 BC x 2 >> ngtd 11/2 Respiratory >> few group B strep, S. Agalactiae isolated 11/2 MRSA PCR >> negative  Antimicrobials:  Unasyn 11/2 >>  Subjective/Interval:  No further brady episodes overnight, otherwise  hemodynamically stable Tmax 100.6 On wakeup assessment, propofol turned to 20 mcg and patient developed dyschrony with MV and coughing, currently sedated on propofol 35 mcg  Objective   Blood pressure 119/64, pulse 76, temperature (!) 100.6 F (38.1 C), resp. rate (!) 26, height 5\' 11"  (1.803 m), weight 103.8 kg, SpO2 100 %.    Vent Mode: PRVC FiO2 (%):  [40 %] 40 % Set Rate:  [26 bmp] 26 bmp Vt Set:  [600 mL] 600 mL PEEP:  [5 cmH20] 5 cmH20 Plateau Pressure:  [16 cmH20-20 cmH20] 18 cmH20   Intake/Output Summary (Last 24 hours) at 01/08/2018 0951 Last data filed at 01/08/2018 0900 Gross per 24 hour  Intake 1529.27 ml  Output 1765 ml  Net -235.73 ml   Filed Weights   01/06/18 0400 01/07/18 0343 01/08/18 0600  Weight: 102 kg 97.8 kg 103.8 kg    Examination: General:  Adult male sedated on MV in NAD HEENT: MM pink/moist, pupils 3/reactive, ETT 8 at 24 cm, OGT, no JVD Neuro: sedated, attempts to open eyes, not f/c CV:  RR, SR 79, +2 pulses, r groin soft PULM: even/non-labored on full MV support, lungs bilaterally clear, no secretions Failed SBT on my exam- due to apnea GI: soft, +BS Extremities: warm/dry, no LE edema  Skin: no rashes  Resolved Hospital Problem list   Cardiogenic shock   Assessment & Plan:   VT cardiac arrest: in the setting of myocardial infarction likely caused by existing CAD and cocaine use.   Under  went cardiac cath with balloon agioplasty and DES to LAD 11/2. - Cardiology primary - continue ASA, ticagrelor (for 12 months) and lipitor    Bradycardia - no further episodes, felt to be related to vagal, as it was associated with coughing spells - monitoring   Acute respiratory failure Presumed aspiration - CXR 11/7 reviewed, stable ETT/ OGT, no obvious infiltrate  - continue full MV support - SBT have been limited secondary to sedation and coughing spells, no issues with mental status, as have been following commands, and minimal secretions. Will  have to rapid wean sedation, SBT trial, assess cuff leak, and hopeful for extubation today.    SpO2 goals > 92% Intermittent CXR as needed If not extubated will need to start TF  Continue unasyn for 5 days of therapy- will finish today    Acute anoxic encephalopathy Substance abuse - rewarming completed 11/6; following commands - minimize sedation for SBT - ongoing neuro check - daily thiamine/folate  Hypokalemia - s/p replete this am KCL - Trending BMET  Normocytic anemia,  - Hgb stable - trending CBC  Hypoglycemia - ongoing D10 at 15 ml/hr, no further episodes of hypoglycemia.  TF held yesterday in anticpation of extubation but was deferred given bradycardia episodes.  Will advance diet if extubated as able or restart TF if needed and d/c D10 gtt.          Disposition / Summary of Today's Plan 01/08/18   Minimize sedation and hopeful for extubation  Diet: Advance diet/ or restart TF VAP protocol DVT prophylaxis: Heparin sq / SCDs GI prophylaxis: Pepcid  Hyperglycemia protocol Mobility: Bedrest Code Status: Full  Family Communication: Girlfriend (of many years), Tiffany, at bedside and updated on plan of care for today 11/7.  Labs   CBC: Recent Labs  Lab 01/04/18 0409  01/04/18 1621 01/05/18 0348 01/06/18 0421 01/07/18 0435 01/08/18 0348  WBC 11.0*  --   --  9.5 9.7 6.9 6.0  HGB 14.3   < > 12.6* 14.0 12.4* 11.3* 11.5*  HCT 42.4   < > 37.0* 41.1 40.2 35.4* 35.4*  MCV 87.1  --   --  86.9 92.4 88.7 89.8  PLT 154  --   --  125* 91* 87* 111*   < > = values in this interval not displayed.    Basic Metabolic Panel: Recent Labs  Lab 01/05/18 0741  01/05/18 1622 01/06/18 0421 01/06/18 1639 01/07/18 0435 01/07/18 1355 01/08/18 0348  NA 141  --   --  140  --  139 139 141  K 3.5  --   --  4.0  --  3.2* 3.5 3.4*  CL 116*  --   --  112*  --  113* 112* 112*  CO2 20*  --   --  23  --  22 21* 19*  GLUCOSE 124*  --   --  108*  --  148* 135* 92  BUN 7  --   --  9   --  11 9 11   CREATININE 1.09  --   --  1.09  --  1.06 1.07 1.20  CALCIUM 7.3*  --   --  7.7*  --  8.1* 8.0* 8.4*  MG  --    < > 2.2 2.1 1.9  --  2.0 2.0  PHOS  --    < > 3.5 3.2 2.6  --  2.0* 2.3*   < > = values in this interval not displayed.   GFR: Estimated Creatinine  Clearance: 93.3 mL/min (by C-G formula based on SCr of 1.2 mg/dL). Recent Labs  Lab 01/03/18 1318 01/03/18 1624  01/05/18 0348 01/05/18 0404 01/06/18 0421 01/07/18 0435 01/08/18 0348  WBC  --  9.0   < > 9.5  --  9.7 6.9 6.0  LATICACIDVEN 10.67* 3.3*  --   --  1.8  --   --   --    < > = values in this interval not displayed.    Liver Function Tests: Recent Labs  Lab 01/05/18 0348 01/06/18 0421 01/07/18 1131 01/07/18 1355 01/08/18 0348  AST 136* 68* 54*  --   --   ALT 92* 65* 45*  --   --   ALKPHOS 52 65 92  --   --   BILITOT 1.1 0.8 1.0  --   --   PROT 5.1* 5.2* 5.5*  --   --   ALBUMIN 2.7* 2.5* 2.3* 2.3* 2.5*   No results for input(s): LIPASE, AMYLASE in the last 168 hours. No results for input(s): AMMONIA in the last 168 hours.  ABG    Component Value Date/Time   PHART 7.312 (L) 01/06/2018 1035   PCO2ART 44.8 01/06/2018 1035   PO2ART 221 (H) 01/06/2018 1035   HCO3 22.0 01/06/2018 1035   TCO2 18 (L) 01/04/2018 1621   ACIDBASEDEF 3.3 (H) 01/06/2018 1035   O2SAT 98.8 01/06/2018 1035     Coagulation Profile: Recent Labs  Lab 01/03/18 1310 01/03/18 1624 01/04/18 0138  INR 1.09 1.49 1.19    Cardiac Enzymes: Recent Labs  Lab 01/03/18 1624 01/03/18 1858 01/04/18 0138 01/04/18 0409 01/07/18 1355  TROPONINI 7.13*  6.82* 11.07* 13.06* 11.19* 3.30*    HbA1C: Hgb A1c MFr Bld  Date/Time Value Ref Range Status  01/06/2018 10:36 AM 5.7 (H) 4.8 - 5.6 % Final    Comment:    (NOTE) Pre diabetes:          5.7%-6.4% Diabetes:              >6.4% Glycemic control for   <7.0% adults with diabetes     CBG: Recent Labs  Lab 01/07/18 1538 01/07/18 2005 01/07/18 2325 01/08/18 0332  01/08/18 0826  GLUCAP 138* 95 98 76 80    Admitting History of Present Illness.   47 year old male with history of HTN who presents to PCCM in the ED after a VT arrest outside the hospital.  Bystander CPR was started and EMS arrived.  Total of 4 rounds of epi and 4 shocks delivered.  With ROSC patient was in sinus tach and was noted to have ST segment elevation and code STEMI was called.  Upon arrival, patient was sedated and intubated and was being taken to the cath lab for emergent cath.  CULPRIT BIFURCATION LESION: Prox LAD lesion is 60% stenosed with 75% stenosed side branch in Ost 1st Diag. Prox LAD to Mid LAD lesion is 100% stenosed. A drug-eluting stent was successfully placed (beginning proximal to diagonal branch) using a STENT SYNERGY DES 3X28. -Postdilated to 3.6 mm. Post intervention, there is a 0% residual stenosis in the LAD.    CCT 35 mins  Posey Boyer, AGACNP-BC [facility] Pgr: [phone] or if no answer [phone] 01/08/2018, 9:51 AM

## 2018-01-08 NOTE — Progress Notes (Signed)
Pt's RR 35-45, BP 180s/110s, HR in 110s. Dr. Vassie Loll paged and notified. New orders received and will continue to monitor.

## 2018-01-08 NOTE — Progress Notes (Addendum)
[facility] Overnight Coverage NOte  Overnight Events   Paged by [facility] nursing staff that patient with increasing sinus tachycardia, diaphoresis, and chest discomfort.   On arrival patient uncomfortable appearing, diaphoretic, and coughing with BIPAP mask on complaining of marked chest discomfort.   Examination notable for fever, hypertension (SBP ~ 150 mmHg) marked diaphoresis, tachycardia, and tachypnea   Chest x-ray with RLL infiltrate on my interpretation  Reviewed UOP > 2000 cc after dose of furosemide earlier today   ABG 7.46/30/86 - metabolic alkalosis with hyperventilation in the setting of fever, reasonable oxygenation   WBC 7.2, Hgb 12.8, Plt 134   12-lead EKG obtained x 2; sinus tachycardia, no ischemic ST/T changes to suggest stent thrombosis    Assessment and Plan  Overall picture concerning for evolving sepsis secondary to likely aspiration pneumonia.    I am going to obtain blood cultures and start broad spectrum antibiotic therapy.  We are controlling his relative hypertension and chest pain (suspect secondary to demand in the setting of sinus tachycardia) with an intravenous nitroglycerine infusion.    Treating fever with acetaminophen.   His arterial blood gas is reassuring and he does not appear to require re-intubation or escalation of his ventilatory support at this time. His BIPAP mask appears to be small for his facial size and I have asked the respiratory team to see if they have larger BIPAP mask.   I am going to also re-check his electrolytes and renal function and continue to diurese him if these are adequate.   Helyn Numbers, MD Cardiology

## 2018-01-08 NOTE — Progress Notes (Addendum)
Progress Note  Patient Name: Rodney Lloyd Date of Encounter: 01/08/2018  Primary Cardiologist: None on file  Subjective   Patient continues to be intubated. He is able to open his eyes and is responsive to nurses commands.   Yesterday evening the patient had a prolonged bradycardia arrhythmia event  Inpatient Medications    Scheduled Meds: . aspirin  81 mg Per Tube Daily  . atorvastatin  40 mg Oral q1800  . chlorhexidine gluconate (MEDLINE KIT)  15 mL Mouth Rinse BID  . feeding supplement (VITAL HIGH PROTEIN)  1,000 mL Per Tube Q24H  . folic acid  1 mg Per Tube Daily  . heparin  5,000 Units Subcutaneous Q8H  . mouth rinse  15 mL Mouth Rinse 10 times per day  . nicotine  14 mg Transdermal Daily  . potassium chloride  20 mEq Per Tube Q4H  . sodium chloride flush  3 mL Intravenous Q12H  . thiamine injection  100 mg Intravenous Daily  . ticagrelor  90 mg Per Tube BID   Continuous Infusions: . sodium chloride 10 mL/hr at 01/07/18 0700  . ampicillin-sulbactam (UNASYN) IV Stopped (01/08/18 9622)  . famotidine (PEPCID) IV 20 mg (01/08/18 0825)  . propofol (DIPRIVAN) infusion 35 mcg/kg/min (01/08/18 0800)   PRN Meds: sodium chloride, acetaminophen, fentaNYL (SUBLIMAZE) injection, ondansetron (ZOFRAN) IV, sodium chloride flush   Vital Signs    Vitals:   01/08/18 0400 01/08/18 0500 01/08/18 0600 01/08/18 0843  BP: (!) 100/58 120/71 116/64   Pulse: 78 75 78 76  Resp: (!) 26 (!) 26 (!) 26 (!) 26  Temp:    (!) 100.6 F (38.1 C)  TempSrc:      SpO2: 100% 100% 100% 100%  Weight:   103.8 kg   Height:        Intake/Output Summary (Last 24 hours) at 01/08/2018 0846 Last data filed at 01/08/2018 0800 Gross per 24 hour  Intake 1612.25 ml  Output 1675 ml  Net -62.75 ml    I/O since admission:   Filed Weights   01/06/18 0400 01/07/18 0343 01/08/18 0600  Weight: 102 kg 97.8 kg 103.8 kg    Telemetry    Normal sinus rhythm- Personally Reviewed  ECG    ECG  (independently read by me):Normal sinus rhythm   Physical Exam    BP 116/64   Pulse 76   Temp (!) 100.6 F (38.1 C)   Resp (!) 26   Ht '5\' 11"'  (1.803 m)   Wt 103.8 kg   SpO2 100%   BMI 31.92 kg/m  Physical Exam  Constitutional: He appears well-developed and well-nourished. No distress.  HENT:  Head: Normocephalic and atraumatic.  Eyes: Conjunctivae are normal.  Cardiovascular: Normal rate and regular rhythm.  Respiratory: Effort normal. No respiratory distress. He has no wheezes.  Patient is intubated  GI: Soft. Bowel sounds are normal. He exhibits no distension. There is no tenderness.  Musculoskeletal: He exhibits no edema.  Neurological: He is alert.  Skin: He is not diaphoretic. No erythema.  Psychiatric:  sedated    Labs    Chemistry Recent Labs  Lab 01/05/18 0348  01/06/18 0421 01/07/18 0435 01/07/18 1131 01/07/18 1355 01/08/18 0348  NA 139   < > 140 139  --  139 141  K 2.7*   < > 4.0 3.2*  --  3.5 3.4*  CL 111   < > 112* 113*  --  112* 112*  CO2 21*   < > 23 22  --  21* 19*  GLUCOSE 130*   < > 108* 148*  --  135* 92  BUN 7   < > 9 11  --  9 11  CREATININE 1.06   < > 1.09 1.06  --  1.07 1.20  CALCIUM 7.4*   < > 7.7* 8.1*  --  8.0* 8.4*  PROT 5.1*  --  5.2*  --  5.5*  --   --   ALBUMIN 2.7*  --  2.5*  --  2.3* 2.3* 2.5*  AST 136*  --  68*  --  54*  --   --   ALT 92*  --  65*  --  45*  --   --   ALKPHOS 52  --  65  --  92  --   --   BILITOT 1.1  --  0.8  --  1.0  --   --   GFRNONAA >60   < > >60 >60  --  >60 >60  GFRAA >60   < > >60 >60  --  >60 >60  ANIONGAP 7   < > 5 4*  --  6 10   < > = values in this interval not displayed.     Hematology Recent Labs  Lab 01/06/18 0421 01/07/18 0435 01/08/18 0348  WBC 9.7 6.9 6.0  RBC 4.35 3.99* 3.94*  HGB 12.4* 11.3* 11.5*  HCT 40.2 35.4* 35.4*  MCV 92.4 88.7 89.8  MCH 28.5 28.3 29.2  MCHC 30.8 31.9 32.5  RDW 13.3 13.2 13.1  PLT 91* 87* 111*    Cardiac Enzymes Recent Labs  Lab 01/03/18 1858  01/04/18 0138 01/04/18 0409 01/07/18 1355  TROPONINI 11.07* 13.06* 11.19* 3.30*    Recent Labs  Lab 01/03/18 1315  TROPIPOC 0.39*     BNPNo results for input(s): BNP, PROBNP in the last 168 hours.   DDimer No results for input(s): DDIMER in the last 168 hours.   Lipid Panel     Component Value Date/Time   CHOL 117 01/07/2018 1131   TRIG 140 01/07/2018 1355   HDL 24 (L) 01/07/2018 1131   CHOLHDL 4.9 01/07/2018 1131   VLDL 26 01/07/2018 1131   LDLCALC 67 01/07/2018 1131     Radiology    Dg Abd 1 View  Result Date: 01/07/2018 CLINICAL DATA:  NG tube placement. EXAM: ABDOMEN - 1 VIEW COMPARISON:  01/03/2018 FINDINGS: The NG tube tip is in the antral region of the stomach. External pacer paddles are noted. The lung bases are grossly clear. No obvious free air is identified IMPRESSION: The NG tube tip is in the antral region of the stomach. Electronically Signed   By: Marijo Sanes M.D.   On: 01/07/2018 08:45   Dg Chest Port 1 View  Result Date: 01/07/2018 CLINICAL DATA:  Intubation EXAM: PORTABLE CHEST 1 VIEW COMPARISON:  01/05/2018 FINDINGS: Patient is rotated to the left. Endotracheal tube tip is at the level of the clavicular heads. Unchanged cardiomegaly. No focal airspace consolidation. The pulmonary arterial catheter has been removed. IMPRESSION: Endotracheal tube tip at the level of the clavicular heads. Electronically Signed   By: Ulyses Jarred M.D.   On: 01/07/2018 01:47    Cardiac Studies   Cath (11/2):  CULPRIT BIFURCATION LESION: Prox LAD lesion is 60% stenosed with 75% stenosed side branch in Ost 1st Diag. Prox LAD to Mid LAD lesion is 100% stenosed.  A drug-eluting stent was successfully placed (beginning proximal to diagonal branch) using a STENT SYNERGY  DES 3X28. -Postdilated to 3.6 mm. Post intervention, there is a 0% residual stenosis in the LAD  Balloon angioplasty was performed on Ost 1st Diag using a BALLOON SAPPHIRE 2.0X12. Post intervention, the side  branch was reduced to 20% residual stenosis.  ----------------------------------------------------------  Ost Ramus lesion is 65% stenosed.  ----------------------------------------------------------  There is mild left ventricular systolic dysfunction. The left ventricular ejection fraction is 45-50% by visual estimate.  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  Successful PCI of LAD with DES & PTCA of ost-prox 1st Diag  EF ~45% with distal Anterior-anterolateral hypokinesis.  Minimally Elevated LVEDP & PCWP with normal RHC pressures -borderline but not true cardiogenic shock  Low Normal CO/CI (with Neosynephrine off).  Acute Respiratory Failure with Acidemia  Metabolic Acidosis - treated with 2 Amp NaHCO3  RECOMMENDATIONS: Admit to CVICU -PCCM managing ventilator and possible cooling protocol  Convert from [facility] to ticagrelor -[place] upon arrival to CCU  Maintain low-dose Neo-Synephrine for blood pressure support due to sedation  Defer decision making Re: Cooling protocol to PCCM. -->Anticipate right heart cath removal either later on today versus tomorrow morning.  High-dose statin, hold off on beta-blocker until we see urine toxicology screen.  Recommend uninterrupted dual antiplatelet therapy with Aspirin 55m daily and Ticagrelor 927mtwice dailyfor a minimum of 12 months (ACS - Class I recommendation).  Will review images with interventional colleagues to determine if ramus intermedius lesion should be treated prior to discharge or manage medically.   Diagnostic Diagram         Post-Intervention Diagram        Echo (01/04/18): Study Conclusions  - Left  ventricle: The cavity size was mildly reduced. There was severe concentric hypertrophy. Systolic function was normal. The estimated ejection fraction was in the range of 55% to 60%. Mild hypokinesis of the apical anterior myocardium; consistent with ischemia in the distribution of the distal left anterior descending coronary artery. Doppler parameters are consistent with abnormal left ventricular relaxation (grade 1 diastolic dysfunction). Acoustic contrast opacification revealed no evidence ofthrombus.   Patient Profile     [age].o. male with hypertension and no known cardiac history presented post cardiac arrest (defibx4, epi x6pta, placed on cooling protocol) secondary to cocaine use with a anterolateral and inferior st elevations. Code STEMI activated, patient sedated and intubated, and taken for emergent cath that found proximal to mid 100% LAD lesion resulting in pci of lad with des and ptca of ost-prox 1st diagonal.  Assessment & Plan    1.Acute anterolateral wall STEMI: EKG 11/6 normal sinus rhythm without st abnormalities or t wave inversions. Echo showing ef of 55-60% and therefore pump function remains normal. Cooling and rewarming protocol completed yesterday 11/6.   -Continue aspirin 8155md -Ticagrelor 26m3md for 12 months -Lipid panel (not fasting): tcholes=117, trig=131, hdl=24, ldl=67 -Atorvastatin 40mg68mver enzymes remain normal   -Heparin 5000u q8hrs    2. Cardiogenic shock: BP improved today. Need to wean off of vent with critical care support.   4.Essential Hypertension: BP ranging 100-120/50-70s over past 24 hrs.  5. Acute respiratory distress,  Aspiration pneumonia: Patient still on ventilator: FiO2=40, peep=5, rr=26, tv=600 Unasyn 5 days  6. Hypokalemia: K=3.4, repleted  Vahini  Chundi, MD Internal Medicine PGY2 [phone] 01/08/2018, 8:46 AM    Patient seen and examined. Agree with assessment and plan.  No further  episode of marked bradycardia/ventricular standstill since episode yesterday.  This had occurred after significant coughing spell.  Current rhythm is stable with sinus in the 70s.  Blood pressure 116/64.  We will repeat ECG today.  On propofol, no longer on Precedex.  Hopefully can wean from ventilator.  No focal airspace consolidation on chest x-ray.  Stable hemodynamics today.   Troy Sine, MD, [facility] 01/08/2018 9:37 AM

## 2018-01-08 NOTE — Progress Notes (Signed)
47 year old hypertensive admitted as STEMI/VT-cardiac arrest, urine toxicology positive for cocaine, THC and benzodiazepines. He underwent cardiac cath with balloon angioplasty and DES to LAD Echo showed EF 55 to 60%.  He was cooled to 33 degrees and rewarmed. 11/6 bradycardic event on Precedex, change to propofol  He was febrile overnight, severely agitated on wake-up assessment with coughing and gagging. On exam sedated on propofol, minimal secretions per RN, decreased breath sounds bilateral, S1-S2 normal, soft nontender abdomen, no edema.  Labs show mild hypokalemia and hypophosphatemia no leukocytosis.  Impression/plan  VT cardiac arrest status post stent to LAD-per cardiology, ct brilinta  Acute respiratory failure-returned off his propofol at the bedside, data quickly wean on pressure support, ensure good tidal volume, he had a lot of coughing and gagging, but we extubated him and he seems to be tolerating this well  Acute encephalopathy-related to above and appears to be resolving, he seems to have good strength in his upper extremities, will involve PT Will need substance abuse counseling. Hypokalemia and hypophosphatemia will be repleted  The patient is critically ill with multiple organ systems failure and requires high complexity decision making for assessment and support, frequent evaluation and titration of therapies, application of advanced monitoring technologies and extensive interpretation of multiple databases. Critical Care Time devoted to patient care services described in this note independent of APP/resident  time is 34 minutes.   Comer Locket Vassie Loll MD

## 2018-01-08 NOTE — Progress Notes (Signed)
Pt placed on BIPAP 10/5 40% for increased respiratory rate, increased work of breathing.  Pt tolerating well at this time.  RT will continue to monitor.

## 2018-01-08 NOTE — Procedures (Signed)
Extubation Procedure Note  Patient Details:   Name: Rodney Lloyd DOB: Sep 02, 1970 MRN: [id number]   Airway Documentation:    Vent end date: 01/08/18 Vent end time: 1048   Evaluation  O2 sats: stable throughout Complications: No apparent complications Patient did tolerate procedure well. Bilateral Breath Sounds: Clear, Diminished   Yes  Tylene Fantasia 01/08/2018, 11:46 AM

## 2018-01-09 DIAGNOSIS — T17908A Unspecified foreign body in respiratory tract, part unspecified causing other injury, initial encounter: Secondary | ICD-10-CM

## 2018-01-09 LAB — GLUCOSE, CAPILLARY
GLUCOSE-CAPILLARY: 103 mg/dL — AB (ref 70–99)
GLUCOSE-CAPILLARY: 106 mg/dL — AB (ref 70–99)
GLUCOSE-CAPILLARY: 91 mg/dL (ref 70–99)
GLUCOSE-CAPILLARY: 97 mg/dL (ref 70–99)

## 2018-01-09 LAB — BASIC METABOLIC PANEL
Anion gap: 11 (ref 5–15)
BUN: 11 mg/dL (ref 6–20)
CALCIUM: 8.2 mg/dL — AB (ref 8.9–10.3)
CHLORIDE: 107 mmol/L (ref 98–111)
CO2: 21 mmol/L — AB (ref 22–32)
CREATININE: 1.19 mg/dL (ref 0.61–1.24)
GFR calc Af Amer: >60 mL/min (ref >60)
GFR calc non Af Amer: >60 mL/min (ref >60)
GLUCOSE: 101 mg/dL — AB (ref 70–99)
Potassium: 3.7 mmol/L (ref 3.5–5.1)
Sodium: 139 mmol/L (ref 135–145)

## 2018-01-09 MED ORDER — VITAMIN B-1 100 MG PO TABS
100.0000 mg | ORAL_TABLET | Freq: Every day | ORAL | Status: DC
  Administered 2018-01-09 – 2018-01-13 (×5): 100 mg via ORAL
  Filled 2018-01-09 (×5): qty 1

## 2018-01-09 MED ORDER — FOLIC ACID 1 MG PO TABS
1.0000 mg | ORAL_TABLET | Freq: Every day | ORAL | Status: DC
  Administered 2018-01-09 – 2018-01-13 (×5): 1 mg via ORAL
  Filled 2018-01-09 (×5): qty 1

## 2018-01-09 MED ORDER — DEXMEDETOMIDINE HCL IN NACL 200 MCG/50ML IV SOLN: 0.2000 ug/kg/h | INTRAVENOUS | Status: DC

## 2018-01-09 MED ORDER — ADULT MULTIVITAMIN W/MINERALS CH
1.0000 | ORAL_TABLET | Freq: Every day | ORAL | Status: DC
  Administered 2018-01-09 – 2018-01-13 (×5): 1 via ORAL
  Filled 2018-01-09 (×5): qty 1

## 2018-01-09 MED ORDER — ASPIRIN 81 MG PO CHEW
81.0000 mg | CHEWABLE_TABLET | Freq: Every day | ORAL | Status: DC
  Administered 2018-01-10 – 2018-01-13 (×4): 81 mg via ORAL
  Filled 2018-01-09 (×4): qty 1

## 2018-01-09 MED ORDER — POTASSIUM CHLORIDE CRYS ER 20 MEQ PO TBCR
40.0000 meq | EXTENDED_RELEASE_TABLET | Freq: Once | ORAL | Status: AC
  Administered 2018-01-09: 40 meq via ORAL
  Filled 2018-01-09: qty 2

## 2018-01-09 MED ORDER — FUROSEMIDE 10 MG/ML IJ SOLN
40.0000 mg | Freq: Once | INTRAMUSCULAR | Status: AC
  Administered 2018-01-09: 40 mg via INTRAVENOUS
  Filled 2018-01-09: qty 4

## 2018-01-09 MED ORDER — LORAZEPAM 2 MG/ML IJ SOLN
1.0000 mg | INTRAMUSCULAR | Status: DC | PRN
  Administered 2018-01-09 (×2): 2 mg via INTRAVENOUS
  Filled 2018-01-09 (×2): qty 1

## 2018-01-09 MED ORDER — TICAGRELOR 90 MG PO TABS
90.0000 mg | ORAL_TABLET | Freq: Two times a day (BID) | ORAL | Status: DC
  Administered 2018-01-09 – 2018-01-13 (×8): 90 mg via ORAL
  Filled 2018-01-09 (×8): qty 1

## 2018-01-09 NOTE — Evaluation (Signed)
Clinical/Bedside Swallow Evaluation Patient Details  Name: Rodney Lloyd MRN: [id number] Date of Birth: [birth year]  Today's Date: 01/09/2018 Time: SLP Start Time (ACUTE ONLY): 1204 SLP Stop Time (ACUTE ONLY): 1217 SLP Time Calculation (min) (ACUTE ONLY): 13 min  Past Medical History:  Past Medical History:  Diagnosis Date  . Hypertension    Past Surgical History:  Past Surgical History:  Procedure Laterality Date  . CORONARY/GRAFT ACUTE MI REVASCULARIZATION N/A 01/03/2018   Procedure: Coronary/Graft Acute MI Revascularization;  Surgeon: Marykay Lex, MD;  Location: [facility] INVASIVE CV LAB;  Service: Cardiovascular;  Laterality: N/A;  . RIGHT/LEFT HEART CATH AND CORONARY ANGIOGRAPHY N/A 01/03/2018   Procedure: RIGHT/LEFT HEART CATH AND CORONARY ANGIOGRAPHY;  Surgeon: Marykay Lex, MD;  Location: [facility] INVASIVE CV LAB;  Service: Cardiovascular;  Laterality: N/A;   HPI:  47 year old hypertensive admitted as STEMI/VT-cardiac arrest, urine toxicology positive for cocaine, THC and benzodiazepines. He underwent cardiac cath with balloon angioplasty.11/5 self extubated, 11/6bradycardic event on Precedex, change to propofol. He was extubated again on11/7, no stridor, the end of the respiratory distress towards evening.  Chest x-ray shows right lower lobe infiltrate    Assessment / Plan / Recommendation Clinical Impression  Pt demonstrates normal swallow function. Vocal quality clear, no signs of aspiration with any consistency or with 3 oz water swallow. Will start heart healthy diet and sign off.  SLP Visit Diagnosis: Dysphagia, unspecified (R13.10)    Aspiration Risk       Diet Recommendation Regular;Thin liquid   Liquid Administration via: Cup;Straw Medication Administration: Whole meds with liquid Supervision: Patient able to self feed Postural Changes: Seated upright at 90 degrees    Other  Recommendations Oral Care Recommendations: Patient independent with oral care   Follow up  Recommendations None      Frequency and Duration            Prognosis        Swallow Study   General HPI: 47 year old hypertensive admitted as STEMI/VT-cardiac arrest, urine toxicology positive for cocaine, THC and benzodiazepines. He underwent cardiac cath with balloon angioplasty.11/5 self extubated, 11/6bradycardic event on Precedex, change to propofol. He was extubated again on11/7, no stridor, the end of the respiratory distress towards evening.  Chest x-ray shows right lower lobe infiltrate  Type of Study: Bedside Swallow Evaluation Previous Swallow Assessment: none Diet Prior to this Study: NPO Temperature Spikes Noted: No Respiratory Status: Nasal cannula History of Recent Intubation: Yes Length of Intubations (days): 6 days Date extubated: 01/08/18 Behavior/Cognition: Alert;Cooperative;Pleasant mood Oral Cavity Assessment: Within Functional Limits Oral Care Completed by SLP: No Oral Cavity - Dentition: Adequate natural dentition Vision: Functional for self-feeding Self-Feeding Abilities: Able to feed self Patient Positioning: Upright in chair Baseline Vocal Quality: Normal Volitional Cough: (guarded) Volitional Swallow: Able to elicit    Oral/Motor/Sensory Function Overall Oral Motor/Sensory Function: Within functional limits   Ice Chips     Thin Liquid Thin Liquid: Within functional limits Presentation: Cup;Straw;Self Fed    Nectar Thick Nectar Thick Liquid: Not tested   Honey Thick Honey Thick Liquid: Not tested   Puree Puree: Within functional limits   Solid     Solid: Within functional limits      Rodney Lloyd, Rodney Lloyd 01/09/2018,12:18 PM

## 2018-01-09 NOTE — Progress Notes (Signed)
Patient is on 180 of Nitroglycerin and still reports chest pain as an 8/10. Systolic BP in the 130's. MD paged. Will continue to monitor.

## 2018-01-09 NOTE — Progress Notes (Signed)
Inpatient Rehabilitation Admissions Coordinator  Patient was screened by Clois Dupes for appropriateness for an Inpatient Acute Rehab Consult per PT recommendation.  At this time, we are recommending Inpatient Rehab consult if pt would like to be considered for admission. Please advise.  Ottie Glazier, RN, MSN Rehab Admissions Coordinator [phone] 01/09/2018 1:45 PM

## 2018-01-09 NOTE — Progress Notes (Signed)
Pt taken off BIPAP and placed on 4L nasal cannula.  Tolerating well at this time.  RT will continue to monitor.  

## 2018-01-09 NOTE — Progress Notes (Signed)
Around 2030 patient was grabbing the left side of his chest and reported pain as a 10/10. Patient was hypertensive, tachycardic and tachypneic (RR 40-50). Patient also had a temp of 100.2 axillary. 12 Lead EKG was done. MD was notified and at the bedside. Verbal orders were to start patient on a Nitroglycerin drip, IV Tylenol for temp and repeat 12 Lead EKG. Labs were drawn and chest x-ray was done. Antibiotics were started. Patient's temp is now 99.9 axillary. Patient's RR is now in the 20's. Patient now reports chest pain as a 5/10 only when he coughs. Will continue to monitor for any changes.

## 2018-01-09 NOTE — Plan of Care (Signed)

## 2018-01-09 NOTE — Evaluation (Signed)
Physical Therapy Evaluation Patient Details Name: Rodney Lloyd MRN: [id number] DOB: [birth year] Today's Date: 01/09/2018   History of Present Illness  Pt is a 47 y.o. M with significant PMH of hypertension admitted as STEMI/VT - cardiac arrest, urine toxicology positive for cocaine, THC and benzodiazepines. Underwent cardiac cath wtih balloon angioplasty. 11/5 self extubated, 11/6 bradycardic event. Extubated again on 11/7. Chest x-ray shows right lower lob infiltrate.  Clinical Impression  Pt admitted with above diagnosis. Pt currently with functional limitations due to the deficits listed below (see PT Problem List). 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 Currently, patient presenting with decreased functional mobility secondary to weakness and decreased endurance. Requiring up to two person moderate assistance to transfer to standing and two person minimal assistance to walk from bed to chair via handheld assist. Suspect patient will continue to progress well based on motivation, PLOF and age. Recommending CIR to maximize functional mobility and decrease caregiver burden. Will follow acutely to progress.  Prior to mobility: 94 bpm, 161/94, 94% SpO2 on 4L O2 Post mobility: 110 bpm, 142/87     Follow Up Recommendations CIR    Equipment Recommendations  Other (comment)(TBD)    Recommendations for Other Services Rehab consult;OT consult     Precautions / Restrictions Precautions Precautions: Fall Restrictions Weight Bearing Restrictions: No      Mobility  Bed Mobility Overal bed mobility: Needs Assistance Bed Mobility: Supine to Sit     Supine to sit: Mod assist;Max assist     General bed mobility comments: Patient requiring mod-max assist to elevate trunk and bring BLE's to edge of bed. Limited by pain rather than lack of strength. Use of bed pad to scoot hips forward. Initial right lateral lean but able to correct  Transfers Overall  transfer level: Needs assistance Equipment used: 2 person hand held assist Transfers: Sit to/from Stand Sit to Stand: Mod assist;+2 physical assistance         General transfer comment: ModA + 2 to boost to standing and steady from edge of bed  Ambulation/Gait Ambulation/Gait assistance: +2 physical assistance;Min assist   Assistive device: 2 person hand held assist Gait Pattern/deviations: Step-through pattern;Trunk flexed;Decreased stride length Gait velocity: decreased Gait velocity interpretation: <1.31 ft/sec, indicative of household ambulator General Gait Details: Patient able to take pivotal steps from bed to chair with minA + 2 via handheld assist. Slow cadence, unsteady, and requiring directional cueing  Stairs            Wheelchair Mobility    Modified Rankin (Stroke Patients Only)       Balance Overall balance assessment: Needs assistance Sitting-balance support: Bilateral upper extremity supported;Feet supported Sitting balance-Leahy Scale: Fair     Standing balance support: Bilateral upper extremity supported Standing balance-Leahy Scale: Poor                               Pertinent Vitals/Pain Pain Assessment: 0-10 Pain Score: 10-Worst pain ever Pain Location: Chest at rest; RN aware Pain Descriptors / Indicators: Grimacing;Guarding Pain Intervention(s): Limited activity within patient's tolerance;Monitored during session;Repositioned    Home Living Family/patient expects to be discharged to:: Private residence Living Arrangements: Spouse/significant other(girlfriend) Available Help at Discharge: Friend(s) Type of Home: House Home Access: Elevator     Home Layout: One level Home Equipment: None      Prior Function Level of Independence: Independent  Comments: Truck Science writer        Extremity/Trunk Assessment   Upper Extremity Assessment Upper Extremity Assessment: Overall WFL for tasks  assessed    Lower Extremity Assessment Lower Extremity Assessment: Overall WFL for tasks assessed       Communication   Communication: No difficulties  Cognition Arousal/Alertness: Awake/alert Behavior During Therapy: WFL for tasks assessed/performed Overall Cognitive Status: Within Functional Limits for tasks assessed                                        General Comments      Exercises     Assessment/Plan    PT Assessment Patient needs continued PT services  PT Problem List Decreased strength;Decreased activity tolerance;Decreased balance;Decreased mobility;Pain       PT Treatment Interventions DME instruction;Gait training;Stair training;Functional mobility training;Therapeutic activities;Therapeutic exercise;Balance training;Patient/family education    PT Goals (Current goals can be found in the Care Plan section)  Acute Rehab PT Goals Patient Stated Goal: "Get something to eat." PT Goal Formulation: With patient/family Time For Goal Achievement: 01/23/18 Potential to Achieve Goals: Good    Frequency Min 3X/week   Barriers to discharge        Co-evaluation               AM-PAC PT "6 Clicks" Daily Activity  Outcome Measure Difficulty turning over in bed (including adjusting bedclothes, sheets and blankets)?: Unable Difficulty moving from lying on back to sitting on the side of the bed? : Unable Difficulty sitting down on and standing up from a chair with arms (e.g., wheelchair, bedside commode, etc,.)?: Unable Help needed moving to and from a bed to chair (including a wheelchair)?: A Little Help needed walking in hospital room?: A Lot Help needed climbing 3-5 steps with a railing? : Total 6 Click Score: 9    End of Session Equipment Utilized During Treatment: Oxygen Activity Tolerance: Patient tolerated treatment well Patient left: in chair;with call bell/phone within reach;with family/visitor present Nurse Communication: Mobility  status PT Visit Diagnosis: Unsteadiness on feet (R26.81);Pain;Difficulty in walking, not elsewhere classified (R26.2) Pain - part of body: (chest)    Time: 1140-1159 PT Time Calculation (min) (ACUTE ONLY): 19 min   Charges:   PT Evaluation $PT Eval Moderate Complexity: 1 Mod         Laurina Bustle, [place], DPT Acute Rehabilitation Services Pager [phone] Office [phone]  Vanetta Mulders 01/09/2018, 1:20 PM

## 2018-01-09 NOTE — Progress Notes (Signed)
NAME:  Rodney Lloyd, MRN:  [id number], DOB:  1970-07-09, LOS: 6 ADMISSION DATE:  01/03/2018, CONSULTATION DATE:  01/03/2018 REFERRING MD:  EDP - [place], CHIEF COMPLAINT:  Cardiac arrest   Brief History   47 year old male with history of HTN who presents to PCCM in the ED after a VT arrest outside the hospital.  Bystander CPR was started and EMS arrived.  Total of 4 rounds of epi and 4 shocks delivered.  With ROSC patient was in sinus tach and was noted to have ST segment elevation and code STEMI was called.  Upon arrival, patient was sedated and intubated and was being taken to the cath lab for emergent cath. UDS positive for cocaine, THC.  Past Medical History  HTN   Significant Hospital Events   11/2>>>STEMI to cath lab post arrest 11/4 >>> cooling over. Attempting to wake up. Still on pressors.  11/5 > Rewarmed. Off pressors. Following basic commands. Failed SBT immediately.  reintubated overnight after unintentional extubation 11/6 > brady/vagal episodes after coughing spells 11/7 > Extubated, developed fever requiring bipap, nitro gtt and added zosyn  Consults: date of consult/date signed off & final recs:  PCCM 11/2>>> SLP 11/8 >>  Procedures (surgical and bedside):  Cardiac cath 11/2 > A few areas of stenosis. Culprit lesion: Prox LAD lesion is 60% stenosed with 75% stenosed side branch in Ost 1st Diag. Prox LAD to Mid LAD lesion is 100% stenosed. Intervention: balloon angioplasty and DES  11/2 ETT >> 11/7 11/2 R femoral CVC >> 11/7  Significant Diagnostic Tests:  Echo 11/3 > EF 55-60%, Mild hypokinesis of the apical anterior myocardium; consistent with ischemia in the distribution of the distal left anterior descending coronary artery. Grade 1 diastolic dysfunction, no evidence of thrombus CT head 11/3 > Negative CT HEAD without contrast. Severe paranasal sinusitis. EEG 11/3 > burst suppression pattern. No seizure activity identified on limited time study.   Micro Data:    11/2 BC x 2 >> negative 11/2 Respiratory >> few group B strep, S. Agalactiae isolated 11/2 MRSA PCR >> negative 11/7 BCx 2 >>  Antimicrobials:  Unasyn 11/2 >> 11/7 Zosyn 11/7 >>  Subjective/Interval:  tmax 100.8 last evening with hypertension, dyspnea with left sided chest pain.  Lasix given.  Started on [place], currently at 200 mcg/ min, with CXR concerning for new RLL infiltrate and zosyn started.  Placed on BiPAP overnight, off since 8am on 4L.  Diuresed ~1L overnight, remains net +4.6L  No complaints per patient other than wanting fruit to eat.  Minor headache. No chest pain.  Objective   Blood pressure 139/82, pulse 98, temperature 98.5 F (36.9 C), temperature source Oral, resp. rate (!) 24, height 5\' 11"  (1.803 m), weight 99.8 kg, SpO2 95 %.    Vent Mode: BIPAP FiO2 (%):  [40 %] 40 % Set Rate:  [26 bmp] 26 bmp PEEP:  [5 cmH20] 5 cmH20 Pressure Support:  [5 cmH20] 5 cmH20   Intake/Output Summary (Last 24 hours) at 01/09/2018 0912 Last data filed at 01/09/2018 0600 Gross per 24 hour  Intake 1516.15 ml  Output 2835 ml  Net -1318.85 ml   Filed Weights   01/07/18 0343 01/08/18 0600 01/09/18 0500  Weight: 97.8 kg 103.8 kg 99.8 kg    Examination: General:  Adult male sitting up in bed eating ice chips, looks great HEENT: MM pink/moist, pupils reactive, noted weak wet cough after ice chips but able able to have productive cough, voice stronger today  Neuro: Alert, intact, non focal  CV: rr, no murmur, +2 pulses PULM: even/non-labored on [place], lungs bilaterally clear anteriorly, crackles noted in left base, diminished in right base ZO:XWRU, non-tender, +bs Extremities: warm/dry, no LE edema  Skin: no rashes   Resolved Hospital Problem list   Cardiogenic shock   Assessment & Plan:   VT cardiac arrest: in the setting of myocardial infarction likely caused by existing CAD and cocaine use.   Under went cardiac cath with balloon agioplasty and DES to LAD 11/2.  TTE 11/3  with LVEF 55-60%, G1DD Hypertensive  Cardiology primary, ASA, lipitor, brilinta per cards  Nitro gtt for BP control per cards     Acute respiratory failure - resolved Presumed aspiration +/- component of pulm edema - s/p 5 days of unasyn completed 11/7 - evening CXR 11/7 noted for possible new RLL inflitrate with tmax 100.8 P:  Titrate O2 for sats > 94% BiPAP PRN for dysnea  Ongoing pulmonary hygiene, progress activity as tolerated Zosyn restarted by Cards Question new aspiration event after extubation and coughing after ice chips this am SLP ordered NPO for now Repeat Lasix 40 mg x 1 as patient remains positive over +4L with stable renal function with KCL 40 meq  Repeat renal panel/ mag in am  CXR in am   Acute anoxic encephalopathy- resolved  Substance and ETOH abuse - monitor  - at risk for ETOH withdrawal - daily thiamine/ folic acid - substance abuse counseling   Hypokalemia- resolved -trend BMET  Normocytic anemia - stable Hgb  - trend CBC  Hypoglycemia - resolved  - monitor CBG q 4 while NPO         Disposition / Summary of Today's Plan 01/09/18   SLP eval today Additional lasix 40 mg x 1 Remains in ICU on NTG gtt per cards   Diet: NPO pending SLP eval VAP protocol- n/a DVT prophylaxis: Heparin SQ GI prophylaxis: Pepcid  Hyperglycemia protocol- n/a Mobility: Bedrest, progress as tolerated  Code Status: full  Family Communication: patient updated on plan of care today.  Labs   CBC: Recent Labs  Lab 01/05/18 0348 01/06/18 0421 01/07/18 0435 01/08/18 0348 01/08/18 2130  WBC 9.5 9.7 6.9 6.0 7.2  HGB 14.0 12.4* 11.3* 11.5* 12.8*  HCT 41.1 40.2 35.4* 35.4* 39.8  MCV 86.9 92.4 88.7 89.8 89.0  PLT 125* 91* 87* 111* 134*    Basic Metabolic Panel: Recent Labs  Lab 01/05/18 1622 01/06/18 0421 01/06/18 1639 01/07/18 0435 01/07/18 1355 01/08/18 0348 01/08/18 2130 01/09/18 0258  NA  --  140  --  139 139 141 137 139  K  --  4.0  --  3.2* 3.5  3.4* 3.5 3.7  CL  --  112*  --  113* 112* 112* 102 107  CO2  --  23  --  22 21* 19* 17* 21*  GLUCOSE  --  108*  --  148* 135* 92 94 101*  BUN  --  9  --  11 9 11 10 11   CREATININE  --  1.09  --  1.06 1.07 1.20 1.26* 1.19  CALCIUM  --  7.7*  --  8.1* 8.0* 8.4* 8.2* 8.2*  MG 2.2 2.1 1.9  --  2.0 2.0 1.9  --   PHOS 3.5 3.2 2.6  --  2.0* 2.3*  --   --    GFR: Estimated Creatinine Clearance: 92.4 mL/min (by C-G formula based on SCr of 1.19 mg/dL). Recent Labs  Lab 01/03/18  1318 01/03/18 1624  01/05/18 0404 01/06/18 0421 01/07/18 0435 01/08/18 0348 01/08/18 2130  WBC  --  9.0   < >  --  9.7 6.9 6.0 7.2  LATICACIDVEN 10.67* 3.3*  --  1.8  --   --   --   --    < > = values in this interval not displayed.    Liver Function Tests: Recent Labs  Lab 01/05/18 0348 01/06/18 0421 01/07/18 1131 01/07/18 1355 01/08/18 0348  AST 136* 68* 54*  --   --   ALT 92* 65* 45*  --   --   ALKPHOS 52 65 92  --   --   BILITOT 1.1 0.8 1.0  --   --   PROT 5.1* 5.2* 5.5*  --   --   ALBUMIN 2.7* 2.5* 2.3* 2.3* 2.5*   No results for input(s): LIPASE, AMYLASE in the last 168 hours. No results for input(s): AMMONIA in the last 168 hours.  ABG    Component Value Date/Time   PHART 7.467 (H) 01/08/2018 2105   PCO2ART 29.9 (L) 01/08/2018 2105   PO2ART 86.0 01/08/2018 2105   HCO3 21.4 01/08/2018 2105   TCO2 22 01/08/2018 2105   ACIDBASEDEF 1.0 01/08/2018 2105   O2SAT 97.0 01/08/2018 2105     Coagulation Profile: Recent Labs  Lab 01/03/18 1310 01/03/18 1624 01/04/18 0138  INR 1.09 1.49 1.19    Cardiac Enzymes: Recent Labs  Lab 01/03/18 1858 01/04/18 0138 01/04/18 0409 01/07/18 1355 01/08/18 2130  TROPONINI 11.07* 13.06* 11.19* 3.30* 1.14*    HbA1C: Hgb A1c MFr Bld  Date/Time Value Ref Range Status  01/06/2018 10:36 AM 5.7 (H) 4.8 - 5.6 % Final    Comment:    (NOTE) Pre diabetes:          5.7%-6.4% Diabetes:              >6.4% Glycemic control for   <7.0% adults with  diabetes     CBG: Recent Labs  Lab 01/08/18 1254 01/08/18 1611 01/08/18 2032 01/08/18 2357 01/09/18 0809  GLUCAP 97 81 82 106* 91   Posey Boyer, AGACNP-BC [facility] Pgr: [phone] or if no answer [phone] 01/09/2018, 9:38 AM

## 2018-01-09 NOTE — Progress Notes (Addendum)
Progress Note  Patient Name: Rodney Lloyd Date of Encounter: 01/09/2018  Primary Cardiologist: None on file   Subjective   Patient reported that he had left sided chest pain last night along with a temperature of 100.2. The patient was started on nitroglycerin drip.   Patient was responsive to questions and stated that he felt that his chest pain was more constant. The patient was moaning throughout the encounter.   Inpatient Medications    Scheduled Meds: . aspirin  81 mg Per Tube Daily  . atorvastatin  40 mg Oral q1800  . folic acid  1 mg Oral Daily  . heparin  5,000 Units Subcutaneous Q8H  . mouth rinse  15 mL Mouth Rinse BID  . nicotine  14 mg Transdermal Daily  . sodium chloride flush  3 mL Intravenous Q12H  . thiamine  100 mg Oral Daily  . ticagrelor  90 mg Per Tube BID   Continuous Infusions: . sodium chloride 10 mL/hr at 01/07/18 0700  . nitroGLYCERIN 115 mcg/min (01/08/18 2200)  . piperacillin-tazobactam (ZOSYN)  IV 3.375 g (01/09/18 0610)  . vancomycin 1,250 mg (01/08/18 2215)   PRN Meds: sodium chloride, acetaminophen, albuterol, hydrALAZINE, nitroGLYCERIN, ondansetron (ZOFRAN) IV, sodium chloride flush   Vital Signs    Vitals:   01/09/18 0300 01/09/18 0400 01/09/18 0500 01/09/18 0600  BP: 136/79 138/84 133/74 133/79  Pulse: (!) 101 96 97 96  Resp: (!) 23 (!) 27 (!) 23 (!) 22  Temp: 100 F (37.8 C) 99.9 F (37.7 C)    TempSrc:      SpO2: 99% 98% 99% 98%  Weight:   99.8 kg   Height:        Intake/Output Summary (Last 24 hours) at 01/09/2018 0720 Last data filed at 01/09/2018 0600 Gross per 24 hour  Intake 2034.6 ml  Output 3150 ml  Net -1115.4 ml    I/O since admission:   Filed Weights   01/07/18 0343 01/08/18 0600 01/09/18 0500  Weight: 97.8 kg 103.8 kg 99.8 kg    Telemetry    Normal sinus rhythm- Personally Reviewed  ECG    ECG (independently read by me):Normal sinus rhythm without significant st or t wave changes   Physical  Exam    BP 133/79   Pulse 96   Temp 99.9 F (37.7 C)   Resp (!) 22   Ht 5\' 11"  (1.803 m)   Wt 99.8 kg   SpO2 98%   BMI 30.69 kg/m   Physical Exam  Constitutional: He appears well-developed and well-nourished. No distress.  HENT:  Head: Normocephalic and atraumatic.  Eyes: Conjunctivae are normal.  Cardiovascular: Normal rate, regular rhythm and normal heart sounds.  Respiratory: Effort normal and breath sounds normal. No respiratory distress. He has no wheezes.  GI: Soft. Bowel sounds are normal. He exhibits no distension. There is no tenderness.  Musculoskeletal: He exhibits no edema.  Neurological: He is alert.  Interactive with provider and responsive to questions  Skin: He is not diaphoretic. No erythema.    Labs    Chemistry Recent Labs  Lab 01/05/18 0348  01/06/18 0421  01/07/18 1131 01/07/18 1355 01/08/18 0348 01/08/18 2130 01/09/18 0258  NA 139   < > 140   < >  --  139 141 137 139  K 2.7*   < > 4.0   < >  --  3.5 3.4* 3.5 3.7  CL 111   < > 112*   < >  --  112* 112* 102 107  CO2 21*   < > 23   < >  --  21* 19* 17* 21*  GLUCOSE 130*   < > 108*   < >  --  135* 92 94 101*  BUN 7   < > 9   < >  --  9 11 10 11   CREATININE 1.06   < > 1.09   < >  --  1.07 1.20 1.26* 1.19  CALCIUM 7.4*   < > 7.7*   < >  --  8.0* 8.4* 8.2* 8.2*  PROT 5.1*  --  5.2*  --  5.5*  --   --   --   --   ALBUMIN 2.7*  --  2.5*  --  2.3* 2.3* 2.5*  --   --   AST 136*  --  68*  --  54*  --   --   --   --   ALT 92*  --  65*  --  45*  --   --   --   --   ALKPHOS 52  --  65  --  92  --   --   --   --   BILITOT 1.1  --  0.8  --  1.0  --   --   --   --   GFRNONAA >60   < > >60   < >  --  >60 >60 >60 >60  GFRAA >60   < > >60   < >  --  >60 >60 >60 >60  ANIONGAP 7   < > 5   < >  --  6 10 18* 11   < > = values in this interval not displayed.     Hematology Recent Labs  Lab 01/07/18 0435 01/08/18 0348 01/08/18 2130  WBC 6.9 6.0 7.2  RBC 3.99* 3.94* 4.47  HGB 11.3* 11.5* 12.8*  HCT 35.4*  35.4* 39.8  MCV 88.7 89.8 89.0  MCH 28.3 29.2 28.6  MCHC 31.9 32.5 32.2  RDW 13.2 13.1 13.2  PLT 87* 111* 134*    Cardiac Enzymes Recent Labs  Lab 01/04/18 0138 01/04/18 0409 01/07/18 1355 01/08/18 2130  TROPONINI 13.06* 11.19* 3.30* 1.14*    Recent Labs  Lab 01/03/18 1315  TROPIPOC 0.39*     BNPNo results for input(s): BNP, PROBNP in the last 168 hours.   DDimer No results for input(s): DDIMER in the last 168 hours.   Lipid Panel     Component Value Date/Time   CHOL 117 01/07/2018 1131   TRIG 140 01/07/2018 1355   HDL 24 (L) 01/07/2018 1131   CHOLHDL 4.9 01/07/2018 1131   VLDL 26 01/07/2018 1131   LDLCALC 67 01/07/2018 1131     Radiology    Dg Abd 1 View  Result Date: 01/07/2018 CLINICAL DATA:  NG tube placement. EXAM: ABDOMEN - 1 VIEW COMPARISON:  01/03/2018 FINDINGS: The NG tube tip is in the antral region of the stomach. External pacer paddles are noted. The lung bases are grossly clear. No obvious free air is identified IMPRESSION: The NG tube tip is in the antral region of the stomach. Electronically Signed   By: Rudie Meyer M.D.   On: 01/07/2018 08:45   Dg Chest Port 1 View  Result Date: 01/08/2018 CLINICAL DATA:  STEMI EXAM: PORTABLE CHEST 1 VIEW COMPARISON:  01/08/2018 FINDINGS: Interval removal of endotracheal and enteric tubes. Mild cardiac enlargement. No vascular congestion. Infiltration in the  right lower lung may represent pneumonia or asymmetrical edema. Small left pleural effusion with atelectasis in the left lung base. No pneumothorax. Mediastinal contours appear intact. IMPRESSION: Cardiac enlargement. Infiltration in the right lower lung may represent pneumonia or asymmetrical edema. Small left pleural effusion with atelectasis in the left lung base. Electronically Signed   By: Burman Nieves M.D.   On: 01/08/2018 22:02   Dg Chest Port 1 View  Result Date: 01/08/2018 CLINICAL DATA:  47 year old male status post cardiac arrest. V-tach, VFib,  prolonged bradycardia arrhythmia event yesterday. EXAM: PORTABLE CHEST 1 VIEW COMPARISON:  01/07/2018 and earlier. FINDINGS: Portable AP semi upright view at 0555 hours. Stable endotracheal tube tip just below the clavicles. Enteric tube courses to the abdomen now, tip not included. Mildly improved lung volumes and bibasilar ventilation with regressed veiling opacity. Mild residual patchy lung base opacity most resembles atelectasis. No pneumothorax or pulmonary edema. No pleural effusion is evident. Mediastinal contours remain normal. IMPRESSION: 1. Stable ET tube. Enteric tube placed and courses to the abdomen, tip not included. 2. Improved lung volumes and ventilation.  Mild basilar atelectasis. Electronically Signed   By: Odessa Fleming M.D.   On: 01/08/2018 09:36    Cardiac Studies   Cath (11/2):  CULPRIT BIFURCATION LESION: Prox LAD lesion is 60% stenosed with 75% stenosed side branch in Ost 1st Diag. Prox LAD to Mid LAD lesion is 100% stenosed.  A drug-eluting stent was successfully placed (beginning proximal to diagonal branch) using a STENT SYNERGY DES 3X28. -Postdilated to 3.6 mm. Post intervention, there is a 0% residual stenosis in the LAD  Balloon angioplasty was performed on Ost 1st Diag using a BALLOON SAPPHIRE 2.0X12. Post intervention, the side branch was reduced to 20% residual stenosis.  ----------------------------------------------------------  Ost Ramus lesion is 65% stenosed.  ----------------------------------------------------------  There is mild left ventricular systolic dysfunction. The left ventricular ejection fraction is 45-50% by visual estimate.  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  Successful PCI of LAD with DES & PTCA of ost-prox 1st Diag  EF ~45% with distal Anterior-anterolateral hypokinesis.  Minimally Elevated LVEDP & PCWP with normal RHC pressures -borderline but not true cardiogenic shock  Low Normal CO/CI (with Neosynephrine off).  Acute Respiratory Failure with Acidemia  Metabolic Acidosis - treated with 2 Amp NaHCO3  RECOMMENDATIONS: Admit to CVICU -PCCM managing ventilator and possible cooling protocol  Convert from [facility] to ticagrelor -> DC Kengreal upon arrival to CCU  Maintain low-dose Neo-Synephrine for blood pressure support due to sedation  Defer decision making Re: Cooling protocol to PCCM. -->Anticipate right heart cath removal either later on today versus tomorrow morning.  High-dose statin, hold off on beta-blocker until we see urine toxicology screen.  Recommend uninterrupted dual antiplatelet therapy with Aspirin 81mg  daily and Ticagrelor 90mg  twice dailyfor a minimum of 12 months (ACS - Class I recommendation).  Will review images with interventional colleagues to determine if ramus intermedius lesion should be treated prior to discharge or manage medically.   Diagnostic Diagram         Post-Intervention Diagram        Echo (01/04/18): Study Conclusions  - Left ventricle: The cavity size was mildly reduced. There was severe concentric hypertrophy. Systolic  function was normal. The estimated ejection fraction was in the range of 55% to 60%. Mild hypokinesis of the apical anterior myocardium; consistent with ischemia in the distribution of the distal left anterior descending coronary artery. Doppler parameters are consistent with abnormal left ventricular relaxation (grade 1 diastolic dysfunction). Acoustic contrast opacification revealed no evidence ofthrombus.   Patient Profile     47 y.o. male with hypertension and no known cardiac history presented post cardiac arrest  (defibx4, epi x6pta, placed on cooling protocol) secondary to cocaine use with a anterolateral and inferior st elevations. Code STEMI activated, patient sedated and intubated, and taken for emergent cath that found proximal to mid 100% LAD lesion resulting in pci of lad with des and ptca of ost-prox 1st diagonal.  Assessment & Plan    1.Acute anterolateral wall STEMI: Patient has continued to have chest pain since yesterday evening. Review of telemetry does not show any new abnormalities. EKG 11/8 without any significant st or t wave abnormalities.  It is possible that there is a component of cocaine and alcohol withdrawal contributing to chest pain.  -IV Nitroglycerin given -Continue aspirin 81mg  qd -Ticagrelor 90mg  bid for 12 months -Atorvastatin 40mg , liver enzymes remain normal   2. Cardiogenic shock:BP normal. Off of precedex and propofol.  4.Essential Hypertension: BP ranging120-140s/60-70sover past 24 hrs.  5.Acute respiratory distress, Aspiration pneumonia: Patient was extubated yesterday 01/08/18. Respirations in 20s. Currently on bipap. Patient was placed on vancomycin and zosyn with the thought that patient has possible sepsis secondary to aspiration pneumonia. Do not appreciate infiltrate on chest xray 11/7. Patient does not have leukocytosis, fever, no end organ damage. Can consider discontinuing. Low suspicion for sepsis.  6. Hypokalemia: K=3.7, will monitor.  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  -CIWA protocol with prn ativan -Multivitamins, folic acid, and thiamine given. -Can start precedex if severe agitation  8. Cocaine withdrawal: Continue to monitor  Lorenso Courier, MD Internal Medicine PGY2 Pager:[phone] 01/09/2018, 7:20 AM    Patient seen and examined. Agree with assessment and plan.  Patient was extubated yesterday.  He is currently on BiPAP therapy complaining of mask pain small.  BiPAP  attempting to be weaned today.  He now admits to sharp pleuritic-like left lateral chest pain very intermittently.  Small pleural effusion noted on chest x-ray with basilar atelectasis.  ECG remained stable without ST changes.  Blood pressure stable.  No ectopy.  No pauses.   Lennette Bihari, MD, [facility] 01/09/2018 10:58 AM

## 2018-01-10 ENCOUNTER — Inpatient Hospital Stay (HOSPITAL_COMMUNITY): Payer: Self-pay

## 2018-01-10 LAB — RENAL FUNCTION PANEL
ANION GAP: 9 (ref 5–15)
Albumin: 2.7 g/dL — ABNORMAL LOW (ref 3.5–5.0)
BUN: 12 mg/dL (ref 6–20)
CHLORIDE: 106 mmol/L (ref 98–111)
CO2: 22 mmol/L (ref 22–32)
Calcium: 8.6 mg/dL — ABNORMAL LOW (ref 8.9–10.3)
Creatinine, Ser: 1.12 mg/dL (ref 0.61–1.24)
Glucose, Bld: 97 mg/dL (ref 70–99)
POTASSIUM: 3.9 mmol/L (ref 3.5–5.1)
Phosphorus: 3 mg/dL (ref 2.5–4.6)
Sodium: 137 mmol/L (ref 135–145)

## 2018-01-10 LAB — GLUCOSE, CAPILLARY
GLUCOSE-CAPILLARY: 94 mg/dL (ref 70–99)
GLUCOSE-CAPILLARY: 90 mg/dL (ref 70–99)
GLUCOSE-CAPILLARY: 111 mg/dL — AB (ref 70–99)
GLUCOSE-CAPILLARY: 87 mg/dL (ref 70–99)
Glucose-Capillary: 98 mg/dL (ref 70–99)

## 2018-01-10 LAB — MAGNESIUM: MAGNESIUM: 2.4 mg/dL (ref 1.7–2.4)

## 2018-01-10 MED ORDER — BENZONATATE 100 MG PO CAPS
100.0000 mg | ORAL_CAPSULE | Freq: Three times a day (TID) | ORAL | Status: DC | PRN
  Administered 2018-01-10: 100 mg via ORAL
  Filled 2018-01-10: qty 1

## 2018-01-10 MED ORDER — LOSARTAN POTASSIUM 25 MG PO TABS
25.0000 mg | ORAL_TABLET | Freq: Every day | ORAL | Status: DC
  Administered 2018-01-10 – 2018-01-13 (×4): 25 mg via ORAL
  Filled 2018-01-10 (×4): qty 1

## 2018-01-10 MED ORDER — OXYCODONE HCL 5 MG PO TABS
5.0000 mg | ORAL_TABLET | Freq: Three times a day (TID) | ORAL | Status: AC | PRN
  Administered 2018-01-10: 5 mg via ORAL
  Filled 2018-01-10: qty 1

## 2018-01-10 MED ORDER — CARVEDILOL 3.125 MG PO TABS
3.1250 mg | ORAL_TABLET | Freq: Two times a day (BID) | ORAL | Status: DC
  Administered 2018-01-10 – 2018-01-11 (×3): 3.125 mg via ORAL
  Filled 2018-01-10 (×3): qty 1

## 2018-01-10 NOTE — Progress Notes (Signed)
Report called on pt and transferred via wheel chair on monitor with SWAT nurse to 6E Room 5.

## 2018-01-10 NOTE — Evaluation (Signed)
Occupational Therapy Evaluation Patient Details Name: Rodney Lloyd MRN: [id number] DOB: 1970/05/12 Today's Date: 01/10/2018    History of Present Illness Pt is a 47 y.o. M with significant PMH of hypertension admitted as STEMI/VT - cardiac arrest, urine toxicology positive for cocaine, THC and benzodiazepines. Underwent cardiac cath wtih balloon angioplasty. 11/5 self extubated, 11/6 bradycardic event. Extubated again on 11/7. Chest x-ray shows right lower lob infiltrate.   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 Presents with impaired cognition, generalized weakness, decreased balance and chest soreness from compressions. Pt requires min assist for ambulation with RW and for ADL. He has a supportive girlfriend and family. Pt with excellent potential to progress to a supervision level with intensive rehab, recommending CIR. Will follow acutely.    Follow Up Recommendations  CIR    Equipment Recommendations  3 in 1 bedside commode    Recommendations for Other Services       Precautions / Restrictions Precautions Precautions: Fall Restrictions Weight Bearing Restrictions: No      Mobility Bed Mobility Overal bed mobility: Needs Assistance Bed Mobility: Supine to Sit;Sit to Supine     Supine to sit: Min guard;HOB elevated Sit to supine: Min assist   General bed mobility comments: increased time, verbal cues to sequence, min assist for LEs back into bed  Transfers Overall transfer level: Needs assistance Equipment used: Rolling walker (2 wheeled) Transfers: Sit to/from Stand Sit to Stand: Min assist         General transfer comment: steadying assist, cues for hand placement    Balance Overall balance assessment: Needs assistance Sitting-balance support: Bilateral upper extremity supported;Feet supported Sitting balance-Leahy Scale: Fair Sitting balance - Comments: min guard for balance as pt donned sock    Standing balance support:  Bilateral upper extremity supported Standing balance-Leahy Scale: Poor                             ADL either performed or assessed with clinical judgement   ADL Overall ADL's : Needs assistance/impaired Eating/Feeding: Independent;Sitting   Grooming: Wash/dry hands;Sitting;Set up   Upper Body Bathing: Minimal assistance;Sitting   Lower Body Bathing: Minimal assistance;Sit to/from stand   Upper Body Dressing : Minimal assistance;Sitting   Lower Body Dressing: Minimal assistance;Sit to/from stand   Toilet Transfer: Minimal assistance;Ambulation;RW;BSC   Toileting- Clothing Manipulation and Hygiene: Minimal assistance;Sit to/from stand       Functional mobility during ADLs: Minimal assistance;Rolling walker       Vision Patient Visual Report: No change from baseline       Perception     Praxis      Pertinent Vitals/Pain Pain Assessment: Faces Faces Pain Scale: Hurts little more Pain Location: chest due to compressions  Pain Descriptors / Indicators: Grimacing;Guarding Pain Intervention(s): Monitored during session;Premedicated before session;Repositioned     Hand Dominance Right   Extremity/Trunk Assessment Upper Extremity Assessment Upper Extremity Assessment: Overall WFL for tasks assessed   Lower Extremity Assessment Lower Extremity Assessment: Defer to PT evaluation   Cervical / Trunk Assessment Cervical / Trunk Assessment: Normal   Communication Communication Communication: No difficulties   Cognition Arousal/Alertness: Awake/alert Behavior During Therapy: WFL for tasks assessed/performed Overall Cognitive Status: Impaired/Different from baseline Area of Impairment: Memory;Safety/judgement;Problem solving                     Memory: Decreased short-term memory   Safety/Judgement: Decreased awareness of deficits;Decreased  awareness of safety   Problem Solving: Slow processing;Difficulty sequencing;Decreased initiation      General Comments       Exercises     Shoulder Instructions      Home Living Family/patient expects to be discharged to:: Private residence Living Arrangements: Spouse/significant other(girlfriend) Available Help at Discharge: Friend(s);Available 24 hours/day Type of Home: House       Home Layout: One level     Bathroom Shower/Tub: Chief Strategy Officer: Standard     Home Equipment: None          Prior Functioning/Environment Level of Independence: Independent        Comments: Truck driver        OT Problem List: Decreased strength;Decreased activity tolerance;Impaired balance (sitting and/or standing);Decreased cognition;Decreased safety awareness;Decreased knowledge of use of DME or AE;Pain      OT Treatment/Interventions: Self-care/ADL training;DME and/or AE instruction;Cognitive remediation/compensation;Therapeutic activities;Balance training;Patient/family education    OT Goals(Current goals can be found in the care plan section) Acute Rehab OT Goals Patient Stated Goal: walk OT Goal Formulation: With patient Time For Goal Achievement: 01/24/18 Potential to Achieve Goals: Good ADL Goals Pt Will Perform Grooming: with supervision;standing Pt Will Perform Lower Body Bathing: with supervision;sit to/from stand Pt Will Perform Lower Body Dressing: with supervision;sit to/from stand Pt Will Transfer to Toilet: with supervision;ambulating;regular height toilet Pt Will Perform Toileting - Clothing Manipulation and hygiene: with supervision;sit to/from stand Pt Will Perform Tub/Shower Transfer: Tub transfer;with supervision;ambulating;3 in 1;rolling walker Additional ADL Goal #1: Pt will identify and gather items necessary for ADL with supervision and RW.  OT Frequency: Min 2X/week   Barriers to D/C:            Co-evaluation              AM-PAC PT "6 Clicks" Daily Activity     Outcome Measure Help from another person eating meals?:  None Help from another person taking care of personal grooming?: A Little Help from another person toileting, which includes using toliet, bedpan, or urinal?: A Little Help from another person bathing (including washing, rinsing, drying)?: A Little Help from another person to put on and taking off regular upper body clothing?: A Little Help from another person to put on and taking off regular lower body clothing?: A Little 6 Click Score: 19   End of Session Equipment Utilized During Treatment: Gait belt;Rolling walker  Activity Tolerance: Patient tolerated treatment well(VSS) Patient left: in bed;with call bell/phone within reach;with family/visitor present  OT Visit Diagnosis: Unsteadiness on feet (R26.81);Other abnormalities of gait and mobility (R26.89);Pain;Muscle weakness (generalized) (M62.81);Other symptoms and signs involving cognitive function                Time: 1610-9604 OT Time Calculation (min): 24 min Charges:  OT General Charges $OT Visit: 1 Visit OT Evaluation $OT Eval Moderate Complexity: 1 Mod OT Treatments $Self Care/Home Management : 8-22 mins  Martie Round, OTR/L Acute Rehabilitation Services Pager: [phone] Office: [phone]  Evern Bio 01/10/2018, 4:31 PM

## 2018-01-10 NOTE — Progress Notes (Signed)
Pt continues to report tenderness/discomfort on left side of chest when coughing. No additional pain medication ordered at this time. Discussed pt trying PRN tylenol at this time, pt agrees. Tylenol PO administered (see MAR). Assessment ongoing.

## 2018-01-10 NOTE — Progress Notes (Signed)
   Called by RN.  Patient still with chest soreness related to CPR.  He is not getting adequate relief with Tylenol.  He also has a cough.  Tramadol with potential interaction that decreases effectiveness of Brilinta. Will give Oxycodone 5 mg Q 8 hrs prn - will only continue Rx for 2 days due to substance abuse issues. Will Rx Tessalon Perles 100 mg TID prn for cough. Tereso Newcomer, PA-C    01/10/2018 2:10 PM

## 2018-01-10 NOTE — Progress Notes (Signed)
Progress Note  Patient Name: Rodney Lloyd Date of Encounter: 01/10/2018  Primary Cardiologist: None on file   Subjective   Had recurrent CP yesterday. Started on IV NTG. Feels fine now. Denies CP or SOB. No troponins drawn. ECG ok.   Inpatient Medications    Scheduled Meds: . aspirin  81 mg Oral Daily  . atorvastatin  40 mg Oral q1800  . folic acid  1 mg Oral Daily  . heparin  5,000 Units Subcutaneous Q8H  . mouth rinse  15 mL Mouth Rinse BID  . multivitamin with minerals  1 tablet Oral Daily  . nicotine  14 mg Transdermal Daily  . sodium chloride flush  3 mL Intravenous Q12H  . thiamine  100 mg Oral Daily  . ticagrelor  90 mg Oral BID   Continuous Infusions: . sodium chloride 10 mL/hr at 01/07/18 0700  . nitroGLYCERIN 50 mcg/min (01/10/18 0921)  . piperacillin-tazobactam (ZOSYN)  IV 12.5 mL/hr at 01/10/18 0900   PRN Meds: sodium chloride, acetaminophen, albuterol, hydrALAZINE, LORazepam, nitroGLYCERIN, ondansetron (ZOFRAN) IV, sodium chloride flush   Vital Signs    Vitals:   01/10/18 0600 01/10/18 0630 01/10/18 0700 01/10/18 0740  BP: 122/74 (!) 142/84 (!) 149/87   Pulse: 82 86 80 83  Resp: (!) 22 (!) 23 19 (!) 23  Temp:    98.7 F (37.1 C)  TempSrc:    Oral  SpO2: 100% 100% 100% 100%  Weight:      Height:        Intake/Output Summary (Last 24 hours) at 01/10/2018 1031 Last data filed at 01/10/2018 0900 Gross per 24 hour  Intake 1888.14 ml  Output 2995 ml  Net -1106.86 ml    I/O since admission:   Filed Weights   01/08/18 0600 01/09/18 0500 01/10/18 0420  Weight: 103.8 kg 99.8 kg 95.9 kg    Telemetry    Normal sinus rhythm 80s Personally reviewe  ECG    ECG (independently read by me):Normal sinus rhythm without significant st or t wave changes   Physical Exam    BP (!) 149/87 (BP Location: Right Arm)   Pulse 83   Temp 98.7 F (37.1 C) (Oral)   Resp (!) 23   Ht 5\' 11"  (1.803 m)   Wt 95.9 kg   SpO2 100%   BMI 29.49 kg/m     General:  Well appearing. No resp difficulty HEENT: normal Neck: supple. no JVD. Carotids 2+ bilat; no bruits. No lymphadenopathy or thryomegaly appreciated. Cor: PMI nondisplaced. Regular rate & rhythm. No rubs, gallops or murmurs. Lungs: clear Abdomen: soft, nontender, nondistended. No hepatosplenomegaly. No bruits or masses. Good bowel sounds. Extremities: no cyanosis, clubbing, rash, edema Neuro: alert & orientedx3, cranial nerves grossly intact. moves all 4 extremities w/o difficulty. Affect pleasan   Labs    Chemistry Recent Labs  Lab 01/05/18 0348  01/06/18 0421  01/07/18 1131 01/07/18 1355 01/08/18 0348 01/08/18 2130 01/09/18 0258 01/10/18 0239  NA 139   < > 140   < >  --  139 141 137 139 137  K 2.7*   < > 4.0   < >  --  3.5 3.4* 3.5 3.7 3.9  CL 111   < > 112*   < >  --  112* 112* 102 107 106  CO2 21*   < > 23   < >  --  21* 19* 17* 21* 22  GLUCOSE 130*   < > 108*   < >  --  135* 92 94 101* 97  BUN 7   < > 9   < >  --  9 11 10 11 12   CREATININE 1.06   < > 1.09   < >  --  1.07 1.20 1.26* 1.19 1.12  CALCIUM 7.4*   < > 7.7*   < >  --  8.0* 8.4* 8.2* 8.2* 8.6*  PROT 5.1*  --  5.2*  --  5.5*  --   --   --   --   --   ALBUMIN 2.7*  --  2.5*  --  2.3* 2.3* 2.5*  --   --  2.7*  AST 136*  --  68*  --  54*  --   --   --   --   --   ALT 92*  --  65*  --  45*  --   --   --   --   --   ALKPHOS 52  --  65  --  92  --   --   --   --   --   BILITOT 1.1  --  0.8  --  1.0  --   --   --   --   --   GFRNONAA >60   < > >60   < >  --  >60 >60 >60 >60 >60  GFRAA >60   < > >60   < >  --  >60 >60 >60 >60 >60  ANIONGAP 7   < > 5   < >  --  6 10 18* 11 9   < > = values in this interval not displayed.     Hematology Recent Labs  Lab 01/07/18 0435 01/08/18 0348 01/08/18 2130  WBC 6.9 6.0 7.2  RBC 3.99* 3.94* 4.47  HGB 11.3* 11.5* 12.8*  HCT 35.4* 35.4* 39.8  MCV 88.7 89.8 89.0  MCH 28.3 29.2 28.6  MCHC 31.9 32.5 32.2  RDW 13.2 13.1 13.2  PLT 87* 111* 134*    Cardiac  Enzymes Recent Labs  Lab 01/04/18 0138 01/04/18 0409 01/07/18 1355 01/08/18 2130  TROPONINI 13.06* 11.19* 3.30* 1.14*    Recent Labs  Lab 01/03/18 1315  TROPIPOC 0.39*     BNPNo results for input(s): BNP, PROBNP in the last 168 hours.   DDimer No results for input(s): DDIMER in the last 168 hours.   Lipid Panel     Component Value Date/Time   CHOL 117 01/07/2018 1131   TRIG 140 01/07/2018 1355   HDL 24 (L) 01/07/2018 1131   CHOLHDL 4.9 01/07/2018 1131   VLDL 26 01/07/2018 1131   LDLCALC 67 01/07/2018 1131     Radiology    Dg Chest Port 1 View  Result Date: 01/10/2018 CLINICAL DATA:  47 year old with respiratory insufficiency. EXAM: PORTABLE CHEST 1 VIEW COMPARISON:  01/08/2018 FINDINGS: Persistent densities at the left lung base. The medial left hemidiaphragm remains obscured. Prominent central vascular structures with slightly improved aeration at the medial right lung base. Heart size is upper limits normal but unchanged. Trachea is midline. Negative for a pneumothorax. Bone structures are unremarkable. IMPRESSION: 1. Persistent left basilar chest densities. Findings could represent a combination of atelectasis and consolidation. 2. Improved aeration at the medial right lung base which may represent decreased atelectasis or edema. 3. Prominent central vascular structures are compatible with vascular congestion. Electronically Signed   By: Richarda Overlie M.D.   On: 01/10/2018 09:00   Dg Chest [facility]  Result Date: 01/08/2018 CLINICAL DATA:  STEMI EXAM: PORTABLE CHEST 1 VIEW COMPARISON:  01/08/2018 FINDINGS: Interval removal of endotracheal and enteric tubes. Mild cardiac enlargement. No vascular congestion. Infiltration in the right lower lung may represent pneumonia or asymmetrical edema. Small left pleural effusion with atelectasis in the left lung base. No pneumothorax. Mediastinal contours appear intact. IMPRESSION: Cardiac enlargement. Infiltration in the right lower lung  may represent pneumonia or asymmetrical edema. Small left pleural effusion with atelectasis in the left lung base. Electronically Signed   By: Burman Nieves M.D.   On: 01/08/2018 22:02    Cardiac Studies   Cath (11/2):  CULPRIT BIFURCATION LESION: Prox LAD lesion is 60% stenosed with 75% stenosed side branch in Ost 1st Diag. Prox LAD to Mid LAD lesion is 100% stenosed.  A drug-eluting stent was successfully placed (beginning proximal to diagonal branch) using a STENT SYNERGY DES 3X28. -Postdilated to 3.6 mm. Post intervention, there is a 0% residual stenosis in the LAD  Balloon angioplasty was performed on Ost 1st Diag using a BALLOON SAPPHIRE 2.0X12. Post intervention, the side branch was reduced to 20% residual stenosis.  ----------------------------------------------------------  Ost Ramus lesion is 65% stenosed.  ----------------------------------------------------------  There is mild left ventricular systolic dysfunction. The left ventricular ejection fraction is 45-50% by visual estimate.  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  Successful PCI of LAD with DES & PTCA of ost-prox 1st Diag  EF ~45% with distal Anterior-anterolateral hypokinesis.  Minimally Elevated LVEDP & PCWP with normal RHC pressures -borderline but not true cardiogenic shock  Low Normal CO/CI (with Neosynephrine off).  Acute Respiratory Failure with Acidemia  Metabolic Acidosis - treated with 2 Amp NaHCO3  RECOMMENDATIONS: Admit to CVICU -PCCM managing ventilator and possible cooling protocol  Convert from [facility] to ticagrelor -> DC Kengreal upon arrival to CCU  Maintain low-dose Neo-Synephrine for blood pressure  support due to sedation  Defer decision making Re: Cooling protocol to PCCM. -->Anticipate right heart cath removal either later on today versus tomorrow morning.  High-dose statin, hold off on beta-blocker until we see urine toxicology screen.  Recommend uninterrupted dual antiplatelet therapy with Aspirin 81mg  daily and Ticagrelor 90mg  twice dailyfor a minimum of 12 months (ACS - Class I recommendation).  Will review images with interventional colleagues to determine if ramus intermedius lesion should be treated prior to discharge or manage medically.   Diagnostic Diagram         Post-Intervention Diagram        Echo (01/04/18): Study Conclusions  - Left ventricle: The cavity size was mildly reduced. There was severe concentric hypertrophy. Systolic function was normal. The estimated ejection fraction was in the range of 55% to 60%. Mild hypokinesis of the apical anterior myocardium; consistent with ischemia in the distribution of the distal left anterior descending coronary artery. Doppler parameters are consistent with abnormal left ventricular relaxation (grade 1 diastolic dysfunction). Acoustic contrast opacification revealed no evidence ofthrombus.   Patient Profile     47 y.o. male with hypertension and no known cardiac history presented post cardiac arrest (defibx4, epi x6pta, placed on cooling protocol) secondary to cocaine use with a anterolateral and inferior st elevations. Code STEMI activated, patient sedated and intubated, and taken for  emergent cath that found proximal to mid 100% LAD lesion resulting in pci of lad with des and ptca of ost-prox 1st diagonal.  Assessment & Plan    1.Cardiac arrest acute anterolateral wall STEMI: - s/p PCI/DES of LAD - had recurrent CP yesterday and IV NTG started. No CP currently. Suspect may be related to CPR - Stop IV NTG - Continue aspirin 81mg  qd - Ticagrelor 90mg  bid for 12 months -  Atorvastatin 40mg , liver enzymes remain normal  - Start carvedilol (recent studies suggest safety in setting of cocaine use) -  2. Cardiogenic shock:BP normal. Off of precedex and propofol. - Resolved - EF 55-60% by echo 11/3  3.Essential Hypertension:  - SBP ranging 140-150 - Adding carvedilol and losartna  5.Acute respiratory distress, Aspiration pneumonia: - Patient was extubated 01/08/18. - On zosyn. Continue for 3 more days per CCM   6. Hypokalemia: K=3.9, will monitor.  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 -CIWA protocol with prn ativan -Multivitamins, folic acid, and thiamine given.  8. Cocaine withdrawal: Continue to monitor  [facility] for Tele. CR to see.   Arvilla Meres, MD  10:31 AM

## 2018-01-10 NOTE — Progress Notes (Signed)
Pt reports pain (tender/discomfort) mid to left side of chest. Pt offered PRN tylenol, pt refuses & requests "something stronger". Pt currently on nitroglycerin gtt (see MAR). Elink notified; awaiting additional orders.

## 2018-01-11 DIAGNOSIS — I10 Essential (primary) hypertension: Secondary | ICD-10-CM

## 2018-01-11 DIAGNOSIS — E876 Hypokalemia: Secondary | ICD-10-CM

## 2018-01-11 DIAGNOSIS — T17908D Unspecified foreign body in respiratory tract, part unspecified causing other injury, subsequent encounter: Secondary | ICD-10-CM

## 2018-01-11 DIAGNOSIS — F10230 Alcohol dependence with withdrawal, uncomplicated: Secondary | ICD-10-CM

## 2018-01-11 LAB — GLUCOSE, CAPILLARY
Glucose-Capillary: 137 mg/dL — ABNORMAL HIGH (ref 70–99)
Glucose-Capillary: 80 mg/dL (ref 70–99)
Glucose-Capillary: 120 mg/dL — ABNORMAL HIGH (ref 70–99)
Glucose-Capillary: 117 mg/dL — ABNORMAL HIGH (ref 70–99)

## 2018-01-11 MED ORDER — PIPERACILLIN-TAZOBACTAM 3.375 G IVPB
3.3750 g | Freq: Three times a day (TID) | INTRAVENOUS | Status: AC
  Administered 2018-01-12 – 2018-01-13 (×4): 3.375 g via INTRAVENOUS
  Filled 2018-01-11 (×4): qty 50

## 2018-01-11 NOTE — Progress Notes (Signed)
Progress Note  Patient Name: Rodney Lloyd Date of Encounter: 01/11/2018  Primary Cardiologist: No primary care provider on file.   Subjective   Has some chest soreness related to CPR for which he is being given PRN oxycodone.  He is also given Tessalon Perles for cough. He is grateful to be alive. He has been walking around the room without difficulty. His memory is slowly improving.  Inpatient Medications    Scheduled Meds: . aspirin  81 mg Oral Daily  . atorvastatin  40 mg Oral q1800  . carvedilol  3.125 mg Oral BID WC  . folic acid  1 mg Oral Daily  . heparin  5,000 Units Subcutaneous Q8H  . losartan  25 mg Oral Daily  . mouth rinse  15 mL Mouth Rinse BID  . multivitamin with minerals  1 tablet Oral Daily  . nicotine  14 mg Transdermal Daily  . sodium chloride flush  3 mL Intravenous Q12H  . thiamine  100 mg Oral Daily  . ticagrelor  90 mg Oral BID   Continuous Infusions: . sodium chloride 10 mL/hr at 01/07/18 0700  . piperacillin-tazobactam (ZOSYN)  IV 3.375 g (01/11/18 0540)   PRN Meds: sodium chloride, acetaminophen, albuterol, benzonatate, hydrALAZINE, LORazepam, nitroGLYCERIN, ondansetron (ZOFRAN) IV, oxyCODONE, sodium chloride flush   Vital Signs    Vitals:   01/10/18 1428 01/10/18 1500 01/10/18 2132 01/11/18 0446  BP:   (!) 152/86 (!) 142/86  Pulse: 90 89 79 87  Resp: 20 20 20 20   Temp:   98.7 F (37.1 C) 99.3 F (37.4 C)  TempSrc:   Oral Oral  SpO2: 100% 100% 100% 100%  Weight:    91.6 kg  Height:        Intake/Output Summary (Last 24 hours) at 01/11/2018 0926 Last data filed at 01/11/2018 0449 Gross per 24 hour  Intake 252.64 ml  Output 300 ml  Net -47.36 ml   Filed Weights   01/09/18 0500 01/10/18 0420 01/11/18 0446  Weight: 99.8 kg 95.9 kg 91.6 kg    Telemetry    Sinus rhythm and sinus tachycardia (low 100 bpm range), rare PVC's - Personally Reviewed  ECG    NA - Personally Reviewed  Physical Exam   GEN: No acute distress.    Neck: No JVD Cardiac: RRR, no murmurs, rubs, or gallops.  Respiratory: Clear to auscultation bilaterally. GI: Soft, nontender, non-distended  MS: No edema; No deformity. Neuro:  Nonfocal  Psych: Normal affect   Labs    Chemistry Recent Labs  Lab 01/05/18 0348  01/06/18 0421  01/07/18 1131 01/07/18 1355 01/08/18 0348 01/08/18 2130 01/09/18 0258 01/10/18 0239  NA 139   < > 140   < >  --  139 141 137 139 137  K 2.7*   < > 4.0   < >  --  3.5 3.4* 3.5 3.7 3.9  CL 111   < > 112*   < >  --  112* 112* 102 107 106  CO2 21*   < > 23   < >  --  21* 19* 17* 21* 22  GLUCOSE 130*   < > 108*   < >  --  135* 92 94 101* 97  BUN 7   < > 9   < >  --  9 11 10 11 12   CREATININE 1.06   < > 1.09   < >  --  1.07 1.20 1.26* 1.19 1.12  CALCIUM 7.4*   < >  7.7*   < >  --  8.0* 8.4* 8.2* 8.2* 8.6*  PROT 5.1*  --  5.2*  --  5.5*  --   --   --   --   --   ALBUMIN 2.7*  --  2.5*  --  2.3* 2.3* 2.5*  --   --  2.7*  AST 136*  --  68*  --  54*  --   --   --   --   --   ALT 92*  --  65*  --  45*  --   --   --   --   --   ALKPHOS 52  --  65  --  92  --   --   --   --   --   BILITOT 1.1  --  0.8  --  1.0  --   --   --   --   --   GFRNONAA >60   < > >60   < >  --  >60 >60 >60 >60 >60  GFRAA >60   < > >60   < >  --  >60 >60 >60 >60 >60  ANIONGAP 7   < > 5   < >  --  6 10 18* 11 9   < > = values in this interval not displayed.     Hematology Recent Labs  Lab 01/07/18 0435 01/08/18 0348 01/08/18 2130  WBC 6.9 6.0 7.2  RBC 3.99* 3.94* 4.47  HGB 11.3* 11.5* 12.8*  HCT 35.4* 35.4* 39.8  MCV 88.7 89.8 89.0  MCH 28.3 29.2 28.6  MCHC 31.9 32.5 32.2  RDW 13.2 13.1 13.2  PLT 87* 111* 134*    Cardiac Enzymes Recent Labs  Lab 01/07/18 1355 01/08/18 2130  TROPONINI 3.30* 1.14*   No results for input(s): TROPIPOC in the last 168 hours.   BNPNo results for input(s): BNP, PROBNP in the last 168 hours.   DDimer No results for input(s): DDIMER in the last 168 hours.   Radiology    Dg Chest Port 1  View  Result Date: 01/10/2018 CLINICAL DATA:  47 year old with respiratory insufficiency. EXAM: PORTABLE CHEST 1 VIEW COMPARISON:  01/08/2018 FINDINGS: Persistent densities at the left lung base. The medial left hemidiaphragm remains obscured. Prominent central vascular structures with slightly improved aeration at the medial right lung base. Heart size is upper limits normal but unchanged. Trachea is midline. Negative for a pneumothorax. Bone structures are unremarkable. IMPRESSION: 1. Persistent left basilar chest densities. Findings could represent a combination of atelectasis and consolidation. 2. Improved aeration at the medial right lung base which may represent decreased atelectasis or edema. 3. Prominent central vascular structures are compatible with vascular congestion. Electronically Signed   By: Richarda Overlie M.D.   On: 01/10/2018 09:00    Cardiac Studies   Cath (11/2):  CULPRIT BIFURCATION LESION: Prox LAD lesion is 60% stenosed with 75% stenosed side branch in Ost 1st Diag. Prox LAD to Mid LAD lesion is 100% stenosed.  A drug-eluting stent was successfully placed (beginning proximal to diagonal branch) using a STENT SYNERGY DES 3X28. -Postdilated to 3.6 mm. Post intervention, there is a 0% residual stenosis in the LAD  Balloon angioplasty was performed on Ost 1st Diag using a BALLOON SAPPHIRE 2.0X12. Post intervention, the side branch was reduced to 20% residual stenosis.  ----------------------------------------------------------  Ost Ramus lesion is 65% stenosed.  ----------------------------------------------------------  There is mild left ventricular systolic dysfunction. The left ventricular  ejection fraction is 45-50% by visual estimate.  LV End Diastolic Pressure is mildly elevated pre-PCI, however post PCI down to 13 mmHg and PCWP 11 mmHg  NORMAL RIGHT HEART CATH PRESSURES  SUMMARY  Ventricular Tachycardia / Fibrillation Cardiac Arrest 2/2 AnteroLateral-Inferior  STEMI  Severe 2-3 Vessel CAD - 100% mLAD, 80% 1st Diag & 70% Rmus Intermedius (RI).  Successful PCI of LAD with DES & PTCA of ost-prox 1st Diag  EF ~45% with distal Anterior-anterolateral hypokinesis.  Minimally Elevated LVEDP & PCWP with normal RHC pressures -borderline but not true cardiogenic shock  Low Normal CO/CI (with Neosynephrine off).  Acute Respiratory Failure with Acidemia  Metabolic Acidosis - treated with 2 Amp NaHCO3  RECOMMENDATIONS: Admit to CVICU -PCCM managing ventilator and possible cooling protocol  Convert from [facility] to ticagrelor -> DC Kengreal upon arrival to CCU  Maintain low-dose Neo-Synephrine for blood pressure support due to sedation  Defer decision making Re: Cooling protocol to PCCM. -->Anticipate right heart cath removal either later on today versus tomorrow morning.  High-dose statin, hold off on beta-blocker until we see urine toxicology screen.  Recommend uninterrupted dual antiplatelet therapy with Aspirin 81mg  daily and Ticagrelor 90mg  twice dailyfor a minimum of 12 months (ACS - Class I recommendation).  Will review images with interventional colleagues to determine if ramus intermedius lesion should be treated prior to discharge or manage medically.   Diagnostic Diagram         Post-Intervention Diagram        Echo (01/04/18): Study Conclusions  - Left ventricle: The cavity size was mildly reduced. There was severe concentric hypertrophy. Systolic function was normal. The estimated ejection fraction was in the range of 55% to 60%. Mild hypokinesis of the apical anterior myocardium; consistent with ischemia in the distribution of the distal left anterior descending coronary artery. Doppler parameters are consistent with abnormal left ventricular relaxation (grade 1 diastolic dysfunction). Acoustic contrast opacification revealed no evidence ofthrombus.   Patient Profile     47 y.o. male  with hypertension and no known cardiac history presented post cardiac arrest (defibx4, epi x6pta, placed on cooling protocol) secondary to cocaine use with a anterolateral and inferior st elevations. Code STEMI activated, patient sedated and intubated, and taken for emergent cath that found proximal to mid 100% LAD lesion resulting in pci of lad with des and ptca of ost-prox 1st diagonal.  Assessment & Plan    1.Cardiac arrest acute anterolateral wall STEMI: - s/p PCI/DES of LAD - had recurrent CP related to CPR for which he is being given oxycodone as needed. - Continue aspirin 81mg  qd - Ticagrelor 90mg  bid for 12 months - Atorvastatin 40mg ,liver enzymes remain normal  -Carvedilol started on 01/10/2018 (recent studies suggest safety in setting of cocaine use)  2. Cardiogenic shock:BP mildly elevated. Off of precedex and propofol. - Resolved - EF 55-60% by echo 11/3  3.Essential Hypertension:  - SBP ranging 140-150 -For the time being continue carvedilol and losartan at present doses.  Carvedilol started 01/10/2018.  5.Acute respiratory distress,Aspiration pneumonia: - Patient was extubated 01/08/18. - On zosyn. Continue for 2 more days per CCM   6.Hypokalemia:K=3.9 on 11/9, will monitor.  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 -CIWA protocol with prn ativan -Multivitamins, folic acid, and thiamine given.  8. Cocaine withdrawal: Continue to monitor   For questions or updates, please contact [facility] Please consult [email or web address] for contact info under Cardiology/STEMI.      Signed, Prentice Docker,  MD  01/11/2018, 9:26 AM

## 2018-01-11 NOTE — Progress Notes (Signed)
Pharmacy Antibiotic Note  Rodney Lloyd is a 47 y.o. male admitted on 01/03/2018 with VT arrest and initially managed with a five-day course of Unasyn for possible aspiration.  Pharmacy has been consulted for vancomycin and Zosyn dosing with worsening respiratory status and concern for sepsis. Pt was initially on TTM to 33 degrees but has since been rewarmed.  Vancomycin now off, pt afebrile, renal function stable. Per MD notes, will continue 2 more days of antimicrobials.  Plan: Zosyn 3.375g IV q8h EI through 11/11 Pharmacy will sign off, reconsult if needed  Height: 5\' 11"  (180.3 cm) Weight: 202 lb (91.6 kg) IBW/kg (Calculated) : 75.3  Temp (24hrs), Avg:99 F (37.2 C), Min:98.7 F (37.1 C), Max:99.3 F (37.4 C)  Recent Labs  Lab 01/05/18 0348 01/05/18 0404  01/06/18 0421 01/07/18 0435 01/07/18 1355 01/08/18 0348 01/08/18 2130 01/09/18 0258 01/10/18 0239  WBC 9.5  --   --  9.7 6.9  --  6.0 7.2  --   --   CREATININE 1.06  --    < > 1.09 1.06 1.07 1.20 1.26* 1.19 1.12  LATICACIDVEN  --  1.8  --   --   --   --   --   --   --   --    < > = values in this interval not displayed.    Estimated Creatinine Clearance: 94.3 mL/min (by C-G formula based on SCr of 1.12 mg/dL).    No Known Allergies  Antimicrobials this admission: Unasyn 11/2 >> 11/7 Vancomycin 11/7 >> 11/8 Zosyn 11/7 >>(11/11)  Dose adjustments this admission: none  Microbiology results: 11/2 MRSA: negative 11/2 BCx: negative 11/2 Trach aspirate: few GBS  Thank you for allowing pharmacy to be a part of this patient's care.  Fredonia Highland, PharmD, BCPS Clinical Pharmacist [phone] Please check AMION for all [facility] Pharmacy numbers 01/11/2018

## 2018-01-12 LAB — BASIC METABOLIC PANEL
Anion gap: 6 (ref 5–15)
BUN: 14 mg/dL (ref 6–20)
CALCIUM: 8.9 mg/dL (ref 8.9–10.3)
CHLORIDE: 108 mmol/L (ref 98–111)
CO2: 25 mmol/L (ref 22–32)
Creatinine, Ser: 1.08 mg/dL (ref 0.61–1.24)
Glucose, Bld: 97 mg/dL (ref 70–99)
Potassium: 3.9 mmol/L (ref 3.5–5.1)
Sodium: 139 mmol/L (ref 135–145)

## 2018-01-12 LAB — GLUCOSE, CAPILLARY
GLUCOSE-CAPILLARY: 108 mg/dL — AB (ref 70–99)
GLUCOSE-CAPILLARY: 132 mg/dL — AB (ref 70–99)
GLUCOSE-CAPILLARY: 93 mg/dL (ref 70–99)
Glucose-Capillary: 101 mg/dL — ABNORMAL HIGH (ref 70–99)
Glucose-Capillary: 137 mg/dL — ABNORMAL HIGH (ref 70–99)

## 2018-01-12 MED ORDER — CARVEDILOL 12.5 MG PO TABS
12.5000 mg | ORAL_TABLET | Freq: Two times a day (BID) | ORAL | Status: DC
  Administered 2018-01-12 – 2018-01-13 (×3): 12.5 mg via ORAL
  Filled 2018-01-12 (×3): qty 1

## 2018-01-12 NOTE — Progress Notes (Signed)
Nutrition Follow-up  DOCUMENTATION CODES:   Not applicable  INTERVENTION:    Continue heart healthy diet.  Heart healthy diet education reviewed with patient.  NUTRITION DIAGNOSIS:   Increased nutrient needs related to post-op healing as evidenced by estimated needs.  Ongoing  GOAL:   Patient will meet greater than or equal to 90% of their needs  Met with intake of meals  MONITOR:   PO intake  ASSESSMENT:   Patient with PMH significant for HTN. Presents this admission after a witnessed cardiac arrest at a hotel. Admitted for VT arrest from STEMI with suspicion of cocaine abuse.    Patient reports good intake of meals. He thinks he has been consuming ~75% of meals. He does not want to receive snacks or supplements at this time. He is trying to cut back on what he is eating to help with weight loss. He has reviewed diet education materials provided by cardiac rehab team. RD discussed heart healthy guidelines with patient. He was able to teach back diet information to RD. Plans for d/c home tomorrow when IV antibiotics have finished.   Labs and medications reviewed.    Diet Order:   Diet Order            Diet Heart Room service appropriate? Yes; Fluid consistency: Thin  Diet effective now              EDUCATION NEEDS:   Education needs have been addressed  Skin:  Skin Assessment: Reviewed RN Assessment  Last BM:  11/9  Height:   Ht Readings from Last 1 Encounters:  01/05/18 '5\' 11"'  (1.803 m)    Weight:   Wt Readings from Last 1 Encounters:  01/12/18 90.4 kg    Ideal Body Weight:  78.2 kg  BMI:  Body mass index is 27.78 kg/m.  Estimated Nutritional Needs:   Kcal:  2200-2400  Protein:  105-115 gm  Fluid:  >/= 2 L/day     Molli Barrows, RD, LDN, [place] Pager [phone] After Hours Pager [phone]

## 2018-01-12 NOTE — Progress Notes (Signed)
Inpatient Rehabilitation Admissions Coordinator  Pt has progressed with therapy and is supervision 300 feet with no AD. Outpt therapy is now recommended. I will sign off.  Ottie Glazier, RN, MSN Rehab Admissions Coordinator [phone] 01/12/2018 3:23 PM'

## 2018-01-12 NOTE — Progress Notes (Signed)
Progress Note  Patient Name: Rodney Lloyd Date of Encounter: 01/12/2018  Primary Cardiologist: Dr. Bryan Lemma, MD   Subjective   Pt feeling well this AM. Walking with cardiac rehabilitation and PT. Has post-CPR wall discomfort. BP remains mild elevated. One day left of Zosyn for aspiration PNA.   Inpatient Medications    Scheduled Meds: . aspirin  81 mg Oral Daily  . atorvastatin  40 mg Oral q1800  . carvedilol  3.125 mg Oral BID WC  . folic acid  1 mg Oral Daily  . heparin  5,000 Units Subcutaneous Q8H  . losartan  25 mg Oral Daily  . mouth rinse  15 mL Mouth Rinse BID  . multivitamin with minerals  1 tablet Oral Daily  . nicotine  14 mg Transdermal Daily  . sodium chloride flush  3 mL Intravenous Q12H  . thiamine  100 mg Oral Daily  . ticagrelor  90 mg Oral BID   Continuous Infusions: . sodium chloride 10 mL/hr at 01/07/18 0700  . piperacillin-tazobactam (ZOSYN)  IV 3.375 g (01/12/18 0301)   PRN Meds: sodium chloride, acetaminophen, albuterol, benzonatate, hydrALAZINE, LORazepam, nitroGLYCERIN, ondansetron (ZOFRAN) IV, oxyCODONE, sodium chloride flush   Vital Signs    Vitals:   01/11/18 0446 01/11/18 1324 01/11/18 2127 01/12/18 0616  BP: (!) 142/86 140/78 125/77 (!) 145/97  Pulse: 87 89 85 98  Resp: 20 12  (!) 22  Temp: 99.3 F (37.4 C) 98.6 F (37 C) 99.1 F (37.3 C) 98.8 F (37.1 C)  TempSrc: Oral Oral Oral Oral  SpO2: 100% 99% 98% 95%  Weight: 91.6 kg   90.4 kg  Height:        Intake/Output Summary (Last 24 hours) at 01/12/2018 0909 Last data filed at 01/12/2018 0301 Gross per 24 hour  Intake 4444.61 ml  Output -  Net 4444.61 ml   Filed Weights   01/10/18 0420 01/11/18 0446 01/12/18 0616  Weight: 95.9 kg 91.6 kg 90.4 kg    Physical Exam   General: Well developed, well nourished, NAD Skin: Warm, dry, intact  Head: Normocephalic, atraumatic, clear, moist mucus membranes. Neck: Negative for carotid bruits. No JVD Lungs:Clear to  ausculation bilaterally. No wheezes, rales, or rhonchi. Breathing is unlabored. Cardiovascular: RRR with S1 S2. No murmurs, rubs, gallops, or LV heave appreciated. Abdomen: Soft, non-tender, non-distended with normoactive bowel sounds. No obvious abdominal masses. MSK: Strength and tone appear normal for age. 5/5 in all extremities Extremities: No edema. No clubbing or cyanosis. DP/PT pulses 2+ bilaterally Neuro: Alert and oriented. No focal deficits. No facial asymmetry. MAE spontaneously. Psych: Responds to questions appropriately with normal affect.    Labs    Chemistry Recent Labs  Lab 01/06/18 0421  01/07/18 1131 01/07/18 1355 01/08/18 0348  01/09/18 0258 01/10/18 0239 01/12/18 0540  NA 140   < >  --  139 141   < > 139 137 139  K 4.0   < >  --  3.5 3.4*   < > 3.7 3.9 3.9  CL 112*   < >  --  112* 112*   < > 107 106 108  CO2 23   < >  --  21* 19*   < > 21* 22 25  GLUCOSE 108*   < >  --  135* 92   < > 101* 97 97  BUN 9   < >  --  9 11   < > 11 12 14   CREATININE 1.09   < >  --  1.07 1.20   < > 1.19 1.12 1.08  CALCIUM 7.7*   < >  --  8.0* 8.4*   < > 8.2* 8.6* 8.9  PROT 5.2*  --  5.5*  --   --   --   --   --   --   ALBUMIN 2.5*  --  2.3* 2.3* 2.5*  --   --  2.7*  --   AST 68*  --  54*  --   --   --   --   --   --   ALT 65*  --  45*  --   --   --   --   --   --   ALKPHOS 65  --  92  --   --   --   --   --   --   BILITOT 0.8  --  1.0  --   --   --   --   --   --   GFRNONAA >60   < >  --  >60 >60   < > >60 >60 >60  GFRAA >60   < >  --  >60 >60   < > >60 >60 >60  ANIONGAP 5   < >  --  6 10   < > 11 9 6    < > = values in this interval not displayed.     Hematology Recent Labs  Lab 01/07/18 0435 01/08/18 0348 01/08/18 2130  WBC 6.9 6.0 7.2  RBC 3.99* 3.94* 4.47  HGB 11.3* 11.5* 12.8*  HCT 35.4* 35.4* 39.8  MCV 88.7 89.8 89.0  MCH 28.3 29.2 28.6  MCHC 31.9 32.5 32.2  RDW 13.2 13.1 13.2  PLT 87* 111* 134*    Cardiac Enzymes Recent Labs  Lab 01/07/18 1355  01/08/18 2130  TROPONINI 3.30* 1.14*   No results for input(s): TROPIPOC in the last 168 hours.   BNPNo results for input(s): BNP, PROBNP in the last 168 hours.   DDimer No results for input(s): DDIMER in the last 168 hours.   Radiology    No results found.  Telemetry    01/12/18 NSR- Personally Reviewed  ECG    No new tracings as of 01/12/18 - Personally Reviewed  Cardiac Studies   Cath (01/03/18):  CULPRIT BIFURCATION LESION: Prox LAD lesion is 60% stenosed with 75% stenosed side branch in Ost 1st Diag. Prox LAD to Mid LAD lesion is 100% stenosed.  A drug-eluting stent was successfully placed (beginning proximal to diagonal branch) using a STENT SYNERGY DES 3X28. -Postdilated to 3.6 mm. Post intervention, there is a 0% residual stenosis in the LAD  Balloon angioplasty was performed on Ost 1st Diag using a BALLOON SAPPHIRE 2.0X12. Post intervention, the side branch was reduced to 20% residual stenosis.  ----------------------------------------------------------  Ost Ramus lesion is 65% stenosed.  ----------------------------------------------------------  There is mild left ventricular systolic dysfunction. The left ventricular ejection fraction is 45-50% by visual estimate.  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  Successful PCI of LAD with DES & PTCA of ost-prox 1st Diag  EF ~45% with distal Anterior-anterolateral hypokinesis.  Minimally Elevated LVEDP & PCWP with normal RHC pressures -borderline but not true cardiogenic shock  Low Normal CO/CI (with Neosynephrine off).  Acute Respiratory Failure with Acidemia  Metabolic Acidosis - treated with 2 Amp NaHCO3  RECOMMENDATIONS: Admit  to CVICU -PCCM managing ventilator and possible cooling protocol  Convert from [facility] to ticagrelor -> DC [place] upon arrival to CCU  Maintain low-dose Neo-Synephrine for blood pressure support due to sedation  Defer decision making Re: Cooling protocol to PCCM. -->Anticipate right heart cath removal either later on today versus tomorrow morning.  High-dose statin, hold off on beta-blocker until we see urine toxicology screen.  Recommend uninterrupted dual antiplatelet therapy with Aspirin 81mg  daily and Ticagrelor 90mg  twice dailyfor a minimum of 12 months (ACS - Class I recommendation).  Will review images with interventional colleagues to determine if ramus intermedius lesion should be treated prior to discharge or manage medically.   Diagnostic Diagram         Post-Intervention Diagram        Echo (01/04/18): Study Conclusions  - Left ventricle: The cavity size was mildly reduced. There was severe concentric hypertrophy. Systolic function was normal. The estimated ejection fraction was in the range of 55% to 60%. Mild hypokinesis of the apical anterior myocardium; consistent with ischemia in the distribution of the distal left anterior descending coronary artery. Doppler parameters are consistent with abnormal left ventricular relaxation (grade 1 diastolic dysfunction). Acoustic contrast opacification revealed no evidence ofthrombus.  Patient Profile     47 y.o. male with hypertension and no known cardiac history presented post cardiac arrest (defibx4, epi x6pta, placed on cooling protocol) secondary to cocaine use with a anterolateral and inferior st elevations. Code STEMI activated, patient sedated and intubated, and taken for emergent cath that found proximal to mid 100% LAD lesion resulting in pci of lad with des and ptca of ost-prox 1st diagonal.  Assessment & Plan    1.Cardiac arrest acute anterolateral wall STEMI: - s/p  PCI/DES of LAD 01/03/18>>>s/p hypothermia  -Continue aspirin 81mg  and Ticagrelor 90mg  bid for minimum of 12 months -Continue atorvastatin 40mg ,liver enzymes remain normal  -Will increase Carvedilol from 3.125 to 12.5 given persistently elevated BP and known CAD (recent studies suggest safety in setting of cocaine use per chart review)>>>will discuss with rounding MD  2. Cardiogenic shock: -Resolved??per PCCM  -BP elevated, increased carvedilol to 12.5 today and monitor closely -EF 55-60% per echocardiogram 01/04/18  3.Essential Hypertension:  -SBP ranging 140-150's>>carvedilol increased to 12.5mg  twice daily   5.Acute respiratory distress secondary to aspiration pneumonia: -Patient was extubated 01/08/18 - On zosyn>>last dose tomorrow, 01/13/18   6.Hypokalemia: -K+ 3.9 today  7. Alcohol and tobacco abuse:  -No s/s of WD -Multivitamins, folic acid, and thiamine given -Cessation strongly encouraged   8. Cocaine withdrawal:  -UDS positive for mariajuana, benzos and cocaine  -Continue to monitor for signs of WD   Signed, Georgie Chard NP-C [facility] Pager: [phone] 01/12/2018, 9:09 AM     For questions or updates, please contact   Please consult [email or web address] for contact info under Cardiology/STEMI.

## 2018-01-12 NOTE — Discharge Summary (Addendum)
Discharge Summary    Patient ID: Rodney Lloyd MRN: [id number]; DOB: 1970-04-15  Admit date: 01/03/2018 Discharge date: 01/13/2018  Primary Care Provider: [facility]  Primary Cardiologist: Bryan Lemma, MD   Discharge Diagnoses    Principal Problem:   Acute ST elevation myocardial infarction (STEMI) of anterolateral wall [facility]) Active Problems:   Essential hypertension   Cardiac arrest due to underlying cardiac condition (HCC)   Cocaine abuse (HCC)   Ventricular fibrillation (HCC)   Cardiopulmonary resuscitation (CPR)-only resuscitation status   Cardiogenic shock (HCC)   Respiratory failure (HCC)   ST elevation myocardial infarction (STEMI) (HCC)   Aspiration pneumonia (HCC)   Acute encephalopathy   Aspiration into airway  Allergies No Known Allergies  Diagnostic Studies/Procedures   Cardiac catheterization 01/03/18:   CULPRIT BIFURCATION LESION: Prox LAD lesion is 60% stenosed with 75% stenosed side branch in Ost 1st Diag. Prox LAD to Mid LAD lesion is 100% stenosed.  A drug-eluting stent was successfully placed (beginning proximal to diagonal branch) using a STENT SYNERGY DES 3X28. -Postdilated to 3.6 mm. Post intervention, there is a 0% residual stenosis in the LAD  Balloon angioplasty was performed on Ost 1st Diag using a BALLOON SAPPHIRE 2.0X12. Post intervention, the side branch was reduced to 20% residual stenosis.  ----------------------------------------------------------  Ost Ramus lesion is 65% stenosed.  ----------------------------------------------------------  There is mild left ventricular systolic dysfunction. The left ventricular ejection fraction is 45-50% by visual estimate.  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  Successful PCI of LAD with DES & PTCA of ost-prox 1st Diag  EF ~45% with distal Anterior-anterolateral hypokinesis.  Minimally Elevated LVEDP & PCWP with normal RHC pressures -borderline but not true cardiogenic shock  Low Normal CO/CI (with Neosynephrine off).  Acute Respiratory Failure with Acidemia  Metabolic Acidosis - treated with 2 Amp NaHCO3  RECOMMENDATIONS: Admit to CVICU -PCCM managing ventilator and possible cooling protocol  Convert from [facility] An to ticagrelor -> DC Kengreal upon arrival to CCU  Maintain low-dose Neo-Synephrine for blood pressure support due to sedation  Defer decision making Re: Cooling protocol to PCCM. -->  Anticipate right heart cath removal either later on today versus tomorrow morning.  High-dose statin, hold off on beta-blocker until we see urine toxicology screen.  Recommend uninterrupted dual antiplatelet therapy with Aspirin 81mg  daily and Ticagrelor 90mg  twice daily for a minimum of 12 months (ACS - Class I recommendation).   Will review images with interventional colleagues to determine if ramus intermedius lesion should be treated prior to discharge or manage medically. _____________  Echocardiogram 01/04/18:  Study Conclusions  - Left ventricle: The cavity size was mildly reduced. There was   severe concentric hypertrophy. Systolic function was normal. The   estimated ejection fraction was in the range of 55% to 60%. Mild   hypokinesis of the apical anterior myocardium; consistent with   ischemia in the distribution of the distal left anterior   descending coronary artery. Doppler parameters are consistent   with abnormal left ventricular relaxation (grade 1 diastolic   dysfunction). Acoustic contrast opacification revealed no   evidence ofthrombus.  History of Present Illness     Rodney Lloyd is a 47 y.o. male with  no prior known  cardiac history who was a witnessed arrest at a hotel on 01/03/18. Significant other found him after fallen down and started on site CPR.  Police then took over CPR after arrival. Upon EMS arrival he was found to be in VT and received three stepwise shocks and was restored to NSR however then began to brady down and required pacing. He had a total of 4 rounds of epinephrine prior to arrival to [facility].  Upon arrival to [facility] he was in NSR/ST with significant anterolateral and inferior ST elevations. Code STEMI was called by EMS in route. In the ER he was intubated and OG tube was placed. He was given 4000 IV heparin, but they were not able to get a second IV line.  He did not receive aspirin.  Hospital Course    Pt was taken emergently to the cath lab on 01/03/18 which revealed ventricular tachycardia / fibrillation cardiac arrest secondary to anterolateral-inferior STEMI with severe 2-3 vessel CAD including 100% mLAD, 80% 1st Diag & 70% rmus intermedius (RI) with successful PCI of LAD with DES & PTCA of ost-prox 1st Diag. EF was found to be approximately 45% with distal anterior-anterolateral hypokinesis. Pt was in acute respiratory failure and was intubated in the cath lab and placed on hypothermia protocol. He was moderately hypotensive and placed on pressors. Recommendation was for uninterrupted DAPT with ASA 81and Ticagrelor 90mg  twice daily for a minimum of 12 months, as well as statin. No beta blocker was initially started secondary to recent cocaine use. Post cardiac cath, PCCM was consulted to manage hypothermia protocol. Neurology team was consulted to perform EEG which was found to be markedly abnormal, however in the post-arrest setting was thought not to be significant for prognosis.   On 11/06 patient had apparent prolonged bradycardia arrhythmic event. According to the nurse the patient had been having a significant paroxysm coughing event and was not on any negative chronotropic medications. Event  was suspected to have vagal etiology. There was no  seizure activity.   After extubation and stabilization from hypothermia protocol, the patient had recurrence of chest pain however follow up ECG revealed complete resolution of significant anterior ST elevation and did not show signs of prior MI. He has since been free of chest pain.  He was eventually transferred off of ICU to telemetry floor where he continued to do well. He began working with PT/OT with anticipation of going home. They have recommended home PT/OT at discharge. He will remain out of work for a minimum of 6 weeks per MD. Cardiac rehabilitation has also been working with the patient on ambulation and cardiac education.    Other hospital problems include:  1. Acute anterolateral wall STEMI:  -Troponin peaked to 13.0. EKG on arrival showing ST elevation in anterolateral leads. Emergent cath showing 60% stenosis of prox lad, prox to mid lad 100% stenosed. PCI of LAD, DES and PTCA of ost-prox 1st diagonal.  -Continue aspirin 81mg  and Ticagrelor 90mg  bid for minimum of 12 months. Case management for assistance with Brilinta -Continue atorvastatin 40mg ,liver enzymes remain normal  -Will continue Carvedilol at current dose and known CAD (recent studies suggest safety in setting of cocaine use per chart review) -Recommend home PT/OT>>ordered  -Patient drives a truck for a living. Will be out of work a minimum of 6 weeks. -Work note given   2. Cardiogenic shock:  -Initially was placed on IV pressors for support while undergoing hypothermia protocol>>resolved and remained hemodynamically stable with stable  BP's  4. Essential Hypertension:  -BP ranging 110-120/50-70s over past 24 hrs -129/81>113/75>124/58>144/89 -Continue carvedilol 12.5mg , losartan 25mg    5. Acute respiratory distress secondary to aspiration pneumonia: -Treated with course of IV anbx per PCCM with IV zosyn>>last dose 01/13/18  6.Hypokalemia: -K+ 3.9 on day  of discharge   7. Alcohol and tobacco abuse:  -No s/s of WD during hospital course  -Multivitamins, folic acid, and thiamine given -Cessation strongly encouraged   8. Cocaine withdrawal:  -UDS positive for mariajuana, benzos and cocaine  -Cessation strongly encouraged   Consultants: PCCM, Neurology   The patient was seen and examined by Dr. [place] who feels that he is stable and ready for discharge today, 01/13/18. Home PT/OT will be set up and close follow up appointment will be made prior to discharge.  _____________  Discharge Vitals Blood pressure 129/81, pulse 69, temperature 98.3 F (36.8 C), temperature source Oral, resp. rate 17, height 5\' 11"  (1.803 m), weight 90.2 kg, SpO2 97 %.  Filed Weights   01/11/18 0446 01/12/18 0616 01/13/18 0445  Weight: 91.6 kg 90.4 kg 90.2 kg   Labs & Radiologic Studies    Basic Metabolic Panel Recent Labs    14/78/29 0540  NA 139  K 3.9  CL 108  CO2 25  GLUCOSE 97  BUN 14  CREATININE 1.08  CALCIUM 8.9  _____________  Dg Abd 1 View  Result Date: 01/07/2018 CLINICAL DATA:  NG tube placement. EXAM: ABDOMEN - 1 VIEW COMPARISON:  01/03/2018 FINDINGS: The NG tube tip is in the antral region of the stomach. External pacer paddles are noted. The lung bases are grossly clear. No obvious free air is identified IMPRESSION: The NG tube tip is in the antral region of the stomach. Electronically Signed   By: Rudie Meyer M.D.   On: 01/07/2018 08:45   Ct Head Wo Contrast  Result Date: 01/04/2018 CLINICAL DATA:  Altered mental status. Cardiac arrest. History of hypertension. EXAM: CT HEAD WITHOUT CONTRAST TECHNIQUE: Contiguous axial images were obtained from the base of the skull through the vertex without intravenous contrast. COMPARISON:  CT face March 14, 2015 FINDINGS: BRAIN: No intraparenchymal hemorrhage, mass effect nor midline shift. The ventricles and sulci are normal. No acute large vascular territory infarcts. No abnormal extra-axial  fluid collections. Basal cisterns are patent. VASCULAR: Unremarkable. SKULL/SOFT TISSUES: No skull fracture. No significant soft tissue swelling. ORBITS/SINUSES: The included ocular globes and orbital contents are normal.Chronic perforated nasal septum. Severe pan paranasal sinusitis. OTHER: None. IMPRESSION: 1. Negative CT HEAD without contrast. 2. Severe paranasal sinusitis. Electronically Signed   By: Awilda Metro M.D.   On: 01/04/2018 05:55   Dg Chest Port 1 View  Result Date: 01/10/2018 CLINICAL DATA:  47 year old with respiratory insufficiency. EXAM: PORTABLE CHEST 1 VIEW COMPARISON:  01/08/2018 FINDINGS: Persistent densities at the left lung base. The medial left hemidiaphragm remains obscured. Prominent central vascular structures with slightly improved aeration at the medial right lung base. Heart size is upper limits normal but unchanged. Trachea is midline. Negative for a pneumothorax. Bone structures are unremarkable. IMPRESSION: 1. Persistent left basilar chest densities. Findings could represent a combination of atelectasis and consolidation. 2. Improved aeration at the medial right lung base which may represent decreased atelectasis or edema. 3. Prominent central vascular structures are compatible with vascular congestion. Electronically Signed   By: Richarda Overlie M.D.   On: 01/10/2018 09:00   Dg Chest Port 1 View  Result Date: 01/08/2018 CLINICAL DATA:  STEMI EXAM: PORTABLE CHEST  1 VIEW COMPARISON:  01/08/2018 FINDINGS: Interval removal of endotracheal and enteric tubes. Mild cardiac enlargement. No vascular congestion. Infiltration in the right lower lung may represent pneumonia or asymmetrical edema. Small left pleural effusion with atelectasis in the left lung base. No pneumothorax. Mediastinal contours appear intact. IMPRESSION: Cardiac enlargement. Infiltration in the right lower lung may represent pneumonia or asymmetrical edema. Small left pleural effusion with atelectasis in the  left lung base. Electronically Signed   By: Burman Nieves M.D.   On: 01/08/2018 22:02   Dg Chest Port 1 View  Result Date: 01/08/2018 CLINICAL DATA:  47 year old male status post cardiac arrest. V-tach, VFib, prolonged bradycardia arrhythmia event yesterday. EXAM: PORTABLE CHEST 1 VIEW COMPARISON:  01/07/2018 and earlier. FINDINGS: Portable AP semi upright view at 0555 hours. Stable endotracheal tube tip just below the clavicles. Enteric tube courses to the abdomen now, tip not included. Mildly improved lung volumes and bibasilar ventilation with regressed veiling opacity. Mild residual patchy lung base opacity most resembles atelectasis. No pneumothorax or pulmonary edema. No pleural effusion is evident. Mediastinal contours remain normal. IMPRESSION: 1. Stable ET tube. Enteric tube placed and courses to the abdomen, tip not included. 2. Improved lung volumes and ventilation.  Mild basilar atelectasis. Electronically Signed   By: Odessa Fleming M.D.   On: 01/08/2018 09:36   Dg Chest Port 1 View  Result Date: 01/07/2018 CLINICAL DATA:  Intubation EXAM: PORTABLE CHEST 1 VIEW COMPARISON:  01/05/2018 FINDINGS: Patient is rotated to the left. Endotracheal tube tip is at the level of the clavicular heads. Unchanged cardiomegaly. No focal airspace consolidation. The pulmonary arterial catheter has been removed. IMPRESSION: Endotracheal tube tip at the level of the clavicular heads. Electronically Signed   By: Deatra Robinson M.D.   On: 01/07/2018 01:47   Dg Chest Port 1 View  Result Date: 01/05/2018 CLINICAL DATA:  Intubated, respiratory failure EXAM: PORTABLE CHEST 1 VIEW COMPARISON:  01/03/2018 chest radiograph. FINDINGS: Endotracheal tube tip is 5.5 cm above the carina. Enteric tube enters stomach with the tip not seen on this image. Inferior approach Swan-Ganz catheter terminates over the right hilum likely within the interlobar branch of the right pulmonary artery. Stable cardiomediastinal silhouette with  normal heart size. No pneumothorax. No pleural effusion. Mild hazy infrahilar opacities in both lungs. IMPRESSION: 1. Inferior approach swan Ganz catheter terminates over the right hilum likely within the interlobar pulmonary artery branch. 2. Well-positioned endotracheal and enteric tubes. 3. Mild hazy infrahilar opacities in both lungs, favor atelectasis or mild edema. Electronically Signed   By: Delbert Phenix M.D.   On: 01/05/2018 08:01   Dg Chest Port 1 View  Result Date: 01/03/2018 CLINICAL DATA:  Post cardiac arrest and CPR. EXAM: PORTABLE CHEST 1 VIEW COMPARISON:  04/20/2015 FINDINGS: Endotracheal tube terminates 4.9 cm superior to the carina, advancement with approximately 2 cm may be considered. Cardiomediastinal silhouette is normal. Mediastinal contours appear intact. There is no evidence of focal airspace consolidation, pleural effusion or pneumothorax. Osseous structures are without acute abnormality. Soft tissues are grossly normal. IMPRESSION: Endotracheal tube terminates 4.9 cm superior to the carina, advancement with approximately 2 cm may be considered. Otherwise no acute findings within the thorax. Electronically Signed   By: Ted Mcalpine M.D.   On: 01/03/2018 13:51   Dg Abd Portable 1v  Result Date: 01/04/2018 CLINICAL DATA:  OG tube placement EXAM: PORTABLE ABDOMEN - 1 VIEW COMPARISON:  None. FINDINGS: The OG tube terminates in the central mid abdomen, likely in the distal  stomach. IMPRESSION: The OG tube is thought to terminate in the distal stomach. Electronically Signed   By: Gerome Sam III M.D   On: 01/04/2018 00:06   Disposition   Pt is being discharged home today in good condition.  Follow-up Plans & Appointments   Follow-up Information    Jodelle Gross, NP Follow up on 01/22/2018.   Specialties:  Nurse Practitioner, Radiology, Cardiology Why:  Your follow up appointment will be on 01/22/18 at 0930am. Please arrive to your appointment by 0915am. Thank  you Contact information: [address] [place] [address] [phone]          Discharge Instructions    AMB Referral to Cardiac Rehabilitation - Phase II   Complete by:  As directed    Diagnosis:   STEMI Coronary Stents     Amb Referral to Cardiac Rehabilitation   Complete by:  As directed    Diagnosis:   STEMI Coronary Stents     Call MD for:  difficulty breathing, headache or visual disturbances   Complete by:  As directed    Call MD for:  persistant dizziness or light-headedness   Complete by:  As directed    Call MD for:  persistant nausea and vomiting   Complete by:  As directed    Call MD for:  redness, tenderness, or signs of infection (pain, swelling, redness, odor or green/yellow discharge around incision site)   Complete by:  As directed    Call MD for:  severe uncontrolled pain   Complete by:  As directed    Call MD for:  temperature >100.4   Complete by:  As directed    Diet - low sodium heart healthy   Complete by:  As directed    Discharge instructions   Complete by:  As directed    PLEASE DO NOT MISS ANY DOSES OF YOUR BRILINTA!!!! Also keep a log of you blood pressures and bring back to your follow up appt. Please call the office with any questions.   Patients taking blood thinners should generally stay away from medicines like ibuprofen, Advil, Motrin, naproxen, and Aleve due to risk of stomach bleeding. You may take Tylenol as directed or talk to your primary doctor about alternatives.  Some studies suggest Prilosec/Omeprazole interacts with Plavix. If you need something for reflux symptoms please use Protonix for less chance of interaction.   No driving for until follow up.  No lifting over 5 lbs for 1 week. No sexual activity for 1 week. You may return to work in 6 weeks, approximately 02/23/18. Keep procedure site clean & dry. If you notice increased pain, swelling, bleeding or pus, call/return!  You may shower, but no soaking  baths/hot tubs/pools for 1 week.   Increase activity slowly   Complete by:  As directed      Discharge Medications   Allergies as of 01/13/2018   No Known Allergies     Medication List    STOP taking these medications   diclofenac 50 MG EC tablet Commonly known as:  VOLTAREN   hydrochlorothiazide 25 MG tablet Commonly known as:  HYDRODIURIL   methocarbamol 500 MG tablet Commonly known as:  ROBAXIN     TAKE these medications   acetaminophen 325 MG tablet Commonly known as:  TYLENOL Take 2 tablets (650 mg total) by mouth every 4 (four) hours as needed for headache or mild pain.   aspirin 81 MG chewable tablet Chew 1 tablet (81 mg total)  by mouth daily. Start taking on:  01/14/2018   atorvastatin 40 MG tablet Commonly known as:  LIPITOR Take 1 tablet (40 mg total) by mouth daily.   carvedilol 12.5 MG tablet Commonly known as:  COREG Take 1 tablet (12.5 mg total) by mouth 2 (two) times daily with a meal.   losartan 25 MG tablet Commonly known as:  COZAAR Take 1 tablet (25 mg total) by mouth daily. Start taking on:  01/14/2018   nicotine 14 mg/24hr patch Commonly known as:  NICODERM CQ - dosed in mg/24 hours Place 1 patch (14 mg total) onto the skin daily.   nitroGLYCERIN 0.4 MG SL tablet Commonly known as:  NITROSTAT Place 1 tablet (0.4 mg total) under the tongue every 5 (five) minutes as needed for chest pain.   ticagrelor 90 MG Tabs tablet Commonly known as:  BRILINTA Take 1 tablet (90 mg total) by mouth 2 (two) times daily.        Acute coronary syndrome (MI, NSTEMI, STEMI, etc) this admission?: Yes.     AHA/ACC Clinical Performance & Quality Measures: 1. Aspirin prescribed? - Yes 2. ADP Receptor Inhibitor (Plavix/Clopidogrel, Brilinta/Ticagrelor or Effient/Prasugrel) prescribed (includes medically managed patients)? - Yes 3. Beta Blocker prescribed? - Yes 4. High Intensity Statin (Lipitor 40-80mg  or Crestor 20-40mg ) prescribed? - Yes 5. EF assessed  during THIS hospitalization? - Yes 6. For EF <40%, was ACEI/ARB prescribed? - Not Applicable (EF >/= 40%) 7. For EF <40%, Aldosterone Antagonist (Spironolactone or Eplerenone) prescribed? - Not Applicable (EF >/= 40%) 8. Cardiac Rehab Phase II ordered (Included Medically managed Patients)? - Yes   Outstanding Labs/Studies   None  Duration of Discharge Encounter   Greater than 30 minutes including physician time.  Signed, Georgie Chard, NP 01/13/2018, 11:41 AM

## 2018-01-12 NOTE — Progress Notes (Signed)
Physical Therapy Treatment Patient Details Name: Rodney Lloyd MRN: [id number] DOB: 21-Oct-1970 Today's Date: 01/12/2018    History of Present Illness Pt is a 47 y.o. M with significant PMH of hypertension admitted as STEMI/VT - cardiac arrest, urine toxicology positive for cocaine, THC and benzodiazepines. Underwent cardiac cath wtih balloon angioplasty. 11/5 self extubated, 11/6 bradycardic event. Extubated again on 11/7. Chest x-ray shows right lower lob infiltrate.    PT Comments    Patient progressing well towards PT goals. Tolerated gait training with supervision for safety. Tolerated higher level balance challenges with only mild deviations in gait but no overt LOB. Pt seems to have some memory deficits and possibly other higher level cognitive deficits? Discharge recommendation updated to OPPT due to improvement in function/mobility. Will have support at home. Discussed exercise program/progression.  Will follow.   Follow Up Recommendations  Outpatient PT;Supervision - Intermittent     Equipment Recommendations  None recommended by PT    Recommendations for Other Services       Precautions / Restrictions Precautions Precautions: Fall Restrictions Weight Bearing Restrictions: No    Mobility  Bed Mobility Overal bed mobility: Needs Assistance Bed Mobility: Supine to Sit;Sit to Supine     Supine to sit: Modified independent (Device/Increase time);HOB elevated     General bed mobility comments: No assist needed.  Transfers Overall transfer level: Needs assistance Equipment used: None Transfers: Sit to/from Stand Sit to Stand: Supervision         General transfer comment: supervision for safety  Ambulation/Gait Ambulation/Gait assistance: Supervision Gait Distance (Feet): 300 Feet Assistive device: None Gait Pattern/deviations: Step-through pattern;Decreased stride length Gait velocity: decreased   General Gait Details: Mostly steady gait with wide BoS and  mild staggering at times only with higher level balance challenges. Cues to increase cadence. See balance section for details.   Stairs             Wheelchair Mobility    Modified Rankin (Stroke Patients Only)       Balance Overall balance assessment: Needs assistance Sitting-balance support: Feet supported;No upper extremity supported Sitting balance-Leahy Scale: Good     Standing balance support: During functional activity Standing balance-Leahy Scale: Fair               High level balance activites: Sudden stops;Direction changes;Turns;Head turns High Level Balance Comments: Tolerated above with only mild deviations in gait but no overt LOB.  Standardized Balance Assessment Standardized Balance Assessment : Dynamic Gait Index   Dynamic Gait Index Level Surface: Mild Impairment Change in Gait Speed: Normal Gait with Horizontal Head Turns: Mild Impairment Gait with Vertical Head Turns: Normal Gait and Pivot Turn: Mild Impairment Step Over Obstacle: Normal Step Around Obstacles: Normal Steps: Mild Impairment Total Score: 20      Cognition Arousal/Alertness: Awake/alert Behavior During Therapy: WFL for tasks assessed/performed Overall Cognitive Status: Impaired/Different from baseline Area of Impairment: Memory                     Memory: Decreased short-term memory         General Comments: Able to recall 3/3 words at end of session using techniques throughout to remember the words without cues. Does not recall PT/OT coming to work with him last week at all. Demonstrates some memory impairments.      Exercises      General Comments        Pertinent Vitals/Pain Pain Assessment: No/denies pain    Home Living  Prior Function            PT Goals (current goals can now be found in the care plan section) Progress towards PT goals: Progressing toward goals    Frequency    Min 3X/week      PT  Plan Discharge plan needs to be updated    Co-evaluation              AM-PAC PT "6 Clicks" Daily Activity  Outcome Measure  Difficulty turning over in bed (including adjusting bedclothes, sheets and blankets)?: None Difficulty moving from lying on back to sitting on the side of the bed? : None Difficulty sitting down on and standing up from a chair with arms (e.g., wheelchair, bedside commode, etc,.)?: None Help needed moving to and from a bed to chair (including a wheelchair)?: None Help needed walking in hospital room?: A Little Help needed climbing 3-5 steps with a railing? : A Little 6 Click Score: 22    End of Session Equipment Utilized During Treatment: Gait belt Activity Tolerance: Patient tolerated treatment well Patient left: in bed;with call bell/phone within reach Nurse Communication: Mobility status PT Visit Diagnosis: Unsteadiness on feet (R26.81);Pain;Difficulty in walking, not elsewhere classified (R26.2)     Time: 1610-9604 PT Time Calculation (min) (ACUTE ONLY): 17 min  Charges:  $Gait Training: 8-22 mins                     Mylo Red, PT, DPT Acute Rehabilitation Services Pager [phone] Office [phone]       Blake Divine A Lanier Ensign 01/12/2018, 2:38 PM

## 2018-01-12 NOTE — Progress Notes (Addendum)
Occupational Therapy Treatment Patient Details Name: Rodney Lloyd MRN: [id number] DOB: Nov 19, 1970 Today's Date: 01/12/2018    History of present illness Pt is a 47 y.o. M with significant PMH of hypertension admitted as STEMI/VT - cardiac arrest, urine toxicology positive for cocaine, THC and benzodiazepines. Underwent cardiac cath wtih balloon angioplasty. 11/5 self extubated, 11/6 bradycardic event. Extubated again on 11/7. Chest x-ray shows right lower lob infiltrate.   OT comments  Pt progressing towards established OT goals and reports he is hopeful to go home soon. Pt performing functional mobility in hallway with supervision and RW. Providing education on safe tub transfer and use of 3N1 for shower seat; pt and wife verbalized understanding. Pt performing simulated tub transfer with Min Guard A for safety. Update dc recommendation to home with HHOT and will continue to follow acutely as admitted.    Follow Up Recommendations  Home health OT;Supervision/Assistance - 24 hour    Equipment Recommendations  3 in 1 bedside commode ; RW   Recommendations for Other Services      Precautions / Restrictions Precautions Precautions: Fall Restrictions Weight Bearing Restrictions: No       Mobility Bed Mobility               General bed mobility comments: OOB upon arrival  Transfers Overall transfer level: Needs assistance Equipment used: Rolling walker (2 wheeled) Transfers: Sit to/from Stand Sit to Stand: Supervision         General transfer comment: supervision for safety    Balance Overall balance assessment: Needs assistance Sitting-balance support: Bilateral upper extremity supported;Feet supported Sitting balance-Leahy Scale: Fair     Standing balance support: Bilateral upper extremity supported Standing balance-Leahy Scale: Fair                             ADL either performed or assessed with clinical judgement   ADL Overall ADL's : Needs  assistance/impaired       Grooming Details (indicate cue type and reason): Pt reporting that he performed grooming tasks this morning with supervision adn assistance from his wife.    Upper Body Bathing Details (indicate cue type and reason): Reporting he bathed with assistance from wife this morning   Lower Body Bathing Details (indicate cue type and reason): Reporting he bathed with assistance from wife this morning       Lower Body Dressing Details (indicate cue type and reason): Pt donned pants this morning with wife after bathing. Toilet Transfer: Supervision/safety;Ambulation;RW(simulated to chair) Toilet Transfer Details (indicate cue type and reason): Supervision for safety. Pt demonstrating good awarness and taking his time     Tub/ Shower Transfer: Tub transfer;Min guard;3 in 1;Ambulation Tub/Shower Transfer Details (indicate cue type and reason): Providing education on tub transfer with 3N1. Min Guard A for safety  Functional mobility during ADLs: Min guard;Rolling walker General ADL Comments: Pt demosntrating increased strength and activity tolerance isnce prior session     Vision       Perception     Praxis      Cognition Arousal/Alertness: Awake/alert Behavior During Therapy: WFL for tasks assessed/performed Overall Cognitive Status: Within Functional Limits for tasks assessed                                 General Comments: Pt following commands appropiately, demonstrating good safety awareness, and demonstrating WFL memory to recall information from  prior session.        Exercises     Shoulder Instructions       General Comments Wife, Elmarie Shiley. present throughout     Pertinent Vitals/ Pain       Pain Assessment: Faces Faces Pain Scale: Hurts little more Pain Location: chest due to compressions  Pain Descriptors / Indicators: Grimacing;Guarding Pain Intervention(s): Monitored during session;Limited activity within patient's  tolerance;Repositioned  Home Living                                          Prior Functioning/Environment              Frequency  Min 2X/week        Progress Toward Goals  OT Goals(current goals can now be found in the care plan section)  Progress towards OT goals: Progressing toward goals  Acute Rehab OT Goals Patient Stated Goal: walk OT Goal Formulation: With patient Time For Goal Achievement: 01/24/18 Potential to Achieve Goals: Good ADL Goals Pt Will Perform Grooming: with supervision;standing Pt Will Perform Lower Body Bathing: with supervision;sit to/from stand Pt Will Perform Lower Body Dressing: with supervision;sit to/from stand Pt Will Transfer to Toilet: with supervision;ambulating;regular height toilet Pt Will Perform Toileting - Clothing Manipulation and hygiene: with supervision;sit to/from stand Pt Will Perform Tub/Shower Transfer: Tub transfer;with supervision;ambulating;3 in 1;rolling walker Additional ADL Goal #1: Pt will identify and gather items necessary for ADL with supervision and RW.  Plan Discharge plan needs to be updated    Co-evaluation                 AM-PAC PT "6 Clicks" Daily Activity     Outcome Measure   Help from another person eating meals?: None Help from another person taking care of personal grooming?: A Little Help from another person toileting, which includes using toliet, bedpan, or urinal?: A Little Help from another person bathing (including washing, rinsing, drying)?: A Little Help from another person to put on and taking off regular upper body clothing?: A Little Help from another person to put on and taking off regular lower body clothing?: A Little 6 Click Score: 19    End of Session Equipment Utilized During Treatment: Rolling walker  OT Visit Diagnosis: Unsteadiness on feet (R26.81);Other abnormalities of gait and mobility (R26.89);Pain;Muscle weakness (generalized) (M62.81);Other  symptoms and signs involving cognitive function   Activity Tolerance Patient tolerated treatment well(VSS)   Patient Left with call bell/phone within reach;with family/visitor present;in chair   Nurse Communication Mobility status        Time: 1610-9604 OT Time Calculation (min): 12 min  Charges: OT General Charges $OT Visit: 1 Visit OT Treatments $Self Care/Home Management : 8-22 mins  Mykeisha Dysert MSOT, OTR/L Acute Rehab Pager: [phone] Office: [phone]   Theodoro Grist Raequon Catanzaro 01/12/2018, 9:50 AM

## 2018-01-12 NOTE — Progress Notes (Signed)
CARDIAC REHAB PHASE I   PRE:  Rate/Rhythm: 98 SR  BP:  Supine:   Sitting: 142/95  Standing:    SaO2: 96%RA  MODE:  Ambulation: 420 ft   POST:  Rate/Rhythm: 106 ST  BP:  Supine:   Sitting: 132/102  Standing:    SaO2: 96%RA 0935-1052 Pt walked 420 ft on RA with rolling walker and asst x 2. Tried without walker and pt not as steady. He stated he felt steadier with walker. Can be minimal asst with walker. Would recommend for home use. Pt's significant other very supportive and interested in education. MI ed completed with pt and significant other. Many questions answered. Reviewed NTG use, MI restrictions, importance of brilinta with stent, watching sodium and heart healthy diet, ex ed, CRP 2. Referred to [place] program. Pt does not have insurance at this time but hopes to obtain soon through employer. Discussed smoking cessation and gave handout. Pt's partner plans to quit also. Encouraged to call 1800 quit now as needed. Notified case manager of need to see pt re brilinta.  Pt stated ready to make needed changes.   Luetta Nutting, RN BSN  01/12/2018 10:47 AM

## 2018-01-13 ENCOUNTER — Encounter (HOSPITAL_COMMUNITY): Payer: Self-pay | Admitting: Cardiology

## 2018-01-13 LAB — CULTURE, BLOOD (ROUTINE X 2)
Culture: NO GROWTH
Culture: NO GROWTH

## 2018-01-13 LAB — GLUCOSE, CAPILLARY: GLUCOSE-CAPILLARY: 140 mg/dL — AB (ref 70–99)

## 2018-01-13 MED ORDER — ACETAMINOPHEN 325 MG PO TABS: 650.0000 mg | ORAL_TABLET | ORAL | Status: DC | PRN

## 2018-01-13 MED ORDER — INFLUENZA VAC SPLIT QUAD 0.5 ML IM SUSY
0.5000 mL | PREFILLED_SYRINGE | Freq: Once | INTRAMUSCULAR | Status: AC
  Administered 2018-01-13: 0.5 mL via INTRAMUSCULAR
  Filled 2018-01-13: qty 0.5

## 2018-01-13 MED ORDER — LOSARTAN POTASSIUM 25 MG PO TABS: 25.0000 mg | ORAL_TABLET | Freq: Every day | ORAL | 4 refills | Status: DC

## 2018-01-13 MED ORDER — ATORVASTATIN CALCIUM 40 MG PO TABS: 40.0000 mg | ORAL_TABLET | Freq: Every day | ORAL | 4 refills | Status: DC

## 2018-01-13 MED ORDER — CARVEDILOL 12.5 MG PO TABS: 12.5000 mg | ORAL_TABLET | Freq: Two times a day (BID) | ORAL | 4 refills | Status: DC

## 2018-01-13 MED ORDER — NITROGLYCERIN 0.4 MG SL SUBL: 0.4000 mg | SUBLINGUAL_TABLET | SUBLINGUAL | 0 refills | Status: DC | PRN

## 2018-01-13 MED ORDER — PNEUMOCOCCAL VAC POLYVALENT 25 MCG/0.5ML IJ INJ
0.5000 mL | INJECTION | Freq: Once | INTRAMUSCULAR | Status: AC
  Administered 2018-01-13: 0.5 mL via INTRAMUSCULAR
  Filled 2018-01-13: qty 0.5

## 2018-01-13 MED ORDER — TICAGRELOR 90 MG PO TABS: 90.0000 mg | ORAL_TABLET | Freq: Two times a day (BID) | ORAL | 10 refills | Status: DC

## 2018-01-13 MED ORDER — ASPIRIN 81 MG PO CHEW: 81.0000 mg | CHEWABLE_TABLET | Freq: Every day | ORAL | Status: DC

## 2018-01-13 MED FILL — ATORVASTATIN CALCIUM 40 MG: 40 | 30 days supply | Qty: 30 | Fill #0

## 2018-01-13 MED FILL — LOSARTAN POTASSIUM 25 MG TA: 25 | 30 days supply | Qty: 30 | Fill #0

## 2018-01-13 MED FILL — NITROGLYCERIN 0.4 MG TAB SL: 0.4 | 25 days supply | Qty: 25 | Fill #0

## 2018-01-13 MED FILL — CARVEDILOL 12.5 MG TABLET: 12.5 | 20 days supply | Qty: 60 | Fill #0

## 2018-01-13 MED FILL — BRILINTA 90 MG TABLET: 90 | 30 days supply | Qty: 60 | Fill #0

## 2018-01-13 NOTE — Progress Notes (Signed)
Discharge instructions reviewed with pt. Pt has no questions at this time. IV d/c. Pt denies any pain and states he is ready to go.

## 2018-01-13 NOTE — Care Management Note (Signed)
Case Management Note  Patient Details  Name: Rodney Lloyd MRN: [id number] Date of Birth: 02/07/1971  Subjective/Objective:  Pt presented for Stemi- Pt is without PCP- and insurance. PT recommendations for PT- outpatient and [facility]. [facility]                 Action/Plan: [facility] to see if [facility] can qualify for [facility]. [facility] working to see if can get approved at this time. DME too expensive- aware to go by [facility] to see if cheaper. Hospital follow up scheduled for PCP follow up and [facility]. [facility] did deliver Brilinta to the bedside. No further needs from CM at this time.   Expected Discharge Date:  01/13/18               Expected Discharge Plan:  Home w Home Health Services  In-House Referral:  NA, PCP / [facility], Financial Counselor  Discharge planning Services  CM Consult, Medication Assistance, Follow-up appt scheduled, [facility]igent [facility](Brilinta)  Post Acute Care Choice:  Home Health Choice offered to:  Patient  DME Arranged:  N/A DME Agency:  NA  HH Arranged:  PT HH Agency:  [facility]  Status of Service:  Completed, signed off  If discussed at Long Length of Stay Meetings, dates discussed:    Additional Comments:  Gala LewandowskyGraves-Bigelow, Chanan Detwiler Kaye, RN 01/13/2018, 1:43 PM

## 2018-01-13 NOTE — Progress Notes (Signed)
CARDIAC REHAB PHASE I   Pt has been ambulating in hallways with girlfriend. Reviewed d/c ed with pt. Stressed importance of ASA and Brilinta. Encouraged continued walking, and gradually increasing activity. Pt requesting BSC, RN made aware. Pt referred to CRP II [place]. Pt hopeful for d/c today.  [id number] Reynold Bowen, RN BSN 01/13/2018 9:12 AM

## 2018-01-13 NOTE — Discharge Instructions (Signed)
Femoral Site Care °Refer to this sheet in the next few weeks. These instructions provide you with information about caring for yourself after your procedure. Your health care provider may also give you more specific instructions. Your treatment has been planned according to current medical practices, but problems sometimes occur. Call your health care provider if you have any problems or questions after your procedure. °What can I expect after the procedure? °After your procedure, it is typical to have the following: °· Bruising at the site that usually fades within 1-2 weeks. °· Blood collecting in the tissue (hematoma) that may be painful to the touch. It should usually decrease in size and tenderness within 1-2 weeks. ° °Follow these instructions at home: °· Take medicines only as directed by your health care provider. °· You may shower 24-48 hours after the procedure or as directed by your health care provider. Remove the bandage (dressing) and gently wash the site with plain soap and water. Pat the area dry with a clean towel. Do not rub the site, because this may cause bleeding. °· Do not take baths, swim, or use a hot tub until your health care provider approves. °· Check your insertion site every day for redness, swelling, or drainage. °· Do not apply powder or lotion to the site. °· Limit use of stairs to twice a day for the first 2-3 days or as directed by your health care provider. °· Do not squat for the first 2-3 days or as directed by your health care provider. °· Do not lift over 10 lb (4.5 kg) for 5 days after your procedure or as directed by your health care provider. °· Ask your health care provider when it is okay to: °? Return to work or school. °? Resume usual physical activities or sports. °? Resume sexual activity. °· Do not drive home if you are discharged the same day as the procedure. Have someone else drive you. °· You may drive 24 hours after the procedure unless otherwise instructed by  your health care provider. °· Do not operate machinery or power tools for 24 hours after the procedure or as directed by your health care provider. °· If your procedure was done as an outpatient procedure, which means that you went home the same day as your procedure, a responsible adult should be with you for the first 24 hours after you arrive home. °· Keep all follow-up visits as directed by your health care provider. This is important. °Contact a health care provider if: °· You have a fever. °· You have chills. °· You have increased bleeding from the site. Hold pressure on the site. °Get help right away if: °· You have unusual pain at the site. °· You have redness, warmth, or swelling at the site. °· You have drainage (other than a small amount of blood on the dressing) from the site. °· The site is bleeding, and the bleeding does not stop after 30 minutes of holding steady pressure on the site. °· Your leg or foot becomes pale, cool, tingly, or numb. °This information is not intended to replace advice given to you by your health care provider. Make sure you discuss any questions you have with your health care provider. °Document Released: 10/22/2013 Document Revised: 07/27/2015 Document Reviewed: 09/07/2013 °Elsevier Interactive Patient Education © 2018 [employer] ° °Radial Site Care °Refer to this sheet in the next few weeks. These instructions provide you with information about caring for yourself after your procedure. Your health   care provider may also give you more specific instructions. Your treatment has been planned according to current medical practices, but problems sometimes occur. Call your health care provider if you have any problems or questions after your procedure. °What can I expect after the procedure? °After your procedure, it is typical to have the following: °· Bruising at the radial site that usually fades within 1-2 weeks. °· Blood collecting in the tissue (hematoma) that may be  painful to the touch. It should usually decrease in size and tenderness within 1-2 weeks. ° °Follow these instructions at home: °· Take medicines only as directed by your health care provider. °· You may shower 24-48 hours after the procedure or as directed by your health care provider. Remove the bandage (dressing) and gently wash the site with plain soap and water. Pat the area dry with a clean towel. Do not rub the site, because this may cause bleeding. °· Do not take baths, swim, or use a hot tub until your health care provider approves. °· Check your insertion site every day for redness, swelling, or drainage. °· Do not apply powder or lotion to the site. °· Do not flex or bend the affected arm for 24 hours or as directed by your health care provider. °· Do not push or pull heavy objects with the affected arm for 24 hours or as directed by your health care provider. °· Do not lift over 10 lb (4.5 kg) for 5 days after your procedure or as directed by your health care provider. °· Ask your health care provider when it is okay to: °? Return to work or school. °? Resume usual physical activities or sports. °? Resume sexual activity. °· Do not drive home if you are discharged the same day as the procedure. Have someone else drive you. °· You may drive 24 hours after the procedure unless otherwise instructed by your health care provider. °· Do not operate machinery or power tools for 24 hours after the procedure. °· If your procedure was done as an outpatient procedure, which means that you went home the same day as your procedure, a responsible adult should be with you for the first 24 hours after you arrive home. °· Keep all follow-up visits as directed by your health care provider. This is important. °Contact a health care provider if: °· You have a fever. °· You have chills. °· You have increased bleeding from the radial site. Hold pressure on the site. °Get help right away if: °· You have unusual pain at the  radial site. °· You have redness, warmth, or swelling at the radial site. °· You have drainage (other than a small amount of blood on the dressing) from the radial site. °· The radial site is bleeding, and the bleeding does not stop after 30 minutes of holding steady pressure on the site. °· Your arm or hand becomes pale, cool, tingly, or numb. °This information is not intended to replace advice given to you by your health care provider. Make sure you discuss any questions you have with your health care provider. °Document Released: 03/23/2010 Document Revised: 07/27/2015 Document Reviewed: 09/06/2013 °Elsevier Interactive Patient Education © 2018 [employer] ° ° °

## 2018-01-13 NOTE — Progress Notes (Signed)
Progress Note  Patient Name: Rodney MuttonSteve C Friar Date of Encounter: 01/13/2018  Primary Cardiologist: Dr. Bryan Lemmaavid Harding, MD   Subjective   Pt feeling well this AM. Ambulating well.  Has mild post-CPR chest wall discomfort. BP is well controlled.   Inpatient Medications    Scheduled Meds: . aspirin  81 mg Oral Daily  . atorvastatin  40 mg Oral q1800  . carvedilol  12.5 mg Oral BID WC  . folic acid  1 mg Oral Daily  . heparin  5,000 Units Subcutaneous Q8H  . losartan  25 mg Oral Daily  . mouth rinse  15 mL Mouth Rinse BID  . multivitamin with minerals  1 tablet Oral Daily  . nicotine  14 mg Transdermal Daily  . sodium chloride flush  3 mL Intravenous Q12H  . thiamine  100 mg Oral Daily  . ticagrelor  90 mg Oral BID   Continuous Infusions: . sodium chloride 10 mL/hr at 01/07/18 0700   PRN Meds: sodium chloride, acetaminophen, albuterol, benzonatate, hydrALAZINE, LORazepam, nitroGLYCERIN, ondansetron (ZOFRAN) IV, sodium chloride flush   Vital Signs    Vitals:   01/12/18 0616 01/12/18 1506 01/12/18 2006 01/13/18 0445  BP: (!) 145/97 (!) 144/89 (!) 124/58 113/75  Pulse: 98 70 81 69  Resp: (!) 22 (!) 22    Temp: 98.8 F (37.1 C) 98.8 F (37.1 C) 98.4 F (36.9 C) 98.3 F (36.8 C)  TempSrc: Oral Oral Oral Oral  SpO2: 95% 99% 97% 97%  Weight: 90.4 kg   90.2 kg  Height:        Intake/Output Summary (Last 24 hours) at 01/13/2018 0748 Last data filed at 01/13/2018 0300 Gross per 24 hour  Intake 390.07 ml  Output -  Net 390.07 ml   Filed Weights   01/11/18 0446 01/12/18 0616 01/13/18 0445  Weight: 91.6 kg 90.4 kg 90.2 kg    Physical Exam   General: Well developed, well nourished, NAD Skin: Warm, dry, intact  Head: Normocephalic, atraumatic, clear, moist mucus membranes. Neck: Negative for carotid bruits. No JVD Lungs:Clear to ausculation bilaterally. No wheezes, rales, or rhonchi. Breathing is unlabored. Cardiovascular: RRR with S1 S2. No murmurs, rubs,  gallops, or LV heave appreciated. Abdomen: Soft, non-tender, non-distended with normoactive bowel sounds. No obvious abdominal masses. MSK: Strength and tone appear normal for age. 5/5 in all extremities Extremities: No edema. No clubbing or cyanosis. DP/PT pulses 2+ bilaterally Neuro: Alert and oriented. No focal deficits. No facial asymmetry. MAE spontaneously. Psych: Responds to questions appropriately with normal affect.    Labs    Chemistry Recent Labs  Lab 01/07/18 1131 01/07/18 1355 01/08/18 0348  01/09/18 0258 01/10/18 0239 01/12/18 0540  NA  --  139 141   < > 139 137 139  K  --  3.5 3.4*   < > 3.7 3.9 3.9  CL  --  112* 112*   < > 107 106 108  CO2  --  21* 19*   < > 21* 22 25  GLUCOSE  --  135* 92   < > 101* 97 97  BUN  --  9 11   < > 11 12 14   CREATININE  --  1.07 1.20   < > 1.19 1.12 1.08  CALCIUM  --  8.0* 8.4*   < > 8.2* 8.6* 8.9  PROT 5.5*  --   --   --   --   --   --   ALBUMIN 2.3* 2.3* 2.5*  --   --  2.7*  --   AST 54*  --   --   --   --   --   --   ALT 45*  --   --   --   --   --   --   ALKPHOS 92  --   --   --   --   --   --   BILITOT 1.0  --   --   --   --   --   --   GFRNONAA  --  >60 >60   < > >60 >60 >60  GFRAA  --  >60 >60   < > >60 >60 >60  ANIONGAP  --  6 10   < > 11 9 6    < > = values in this interval not displayed.     Hematology Recent Labs  Lab 01/07/18 0435 01/08/18 0348 01/08/18 2130  WBC 6.9 6.0 7.2  RBC 3.99* 3.94* 4.47  HGB 11.3* 11.5* 12.8*  HCT 35.4* 35.4* 39.8  MCV 88.7 89.8 89.0  MCH 28.3 29.2 28.6  MCHC 31.9 32.5 32.2  RDW 13.2 13.1 13.2  PLT 87* 111* 134*    Cardiac Enzymes Recent Labs  Lab 01/07/18 1355 01/08/18 2130  TROPONINI 3.30* 1.14*   No results for input(s): TROPIPOC in the last 168 hours.   BNPNo results for input(s): BNP, PROBNP in the last 168 hours.   DDimer No results for input(s): DDIMER in the last 168 hours.   Radiology    No results found.  Telemetry    01/12/18 NSR- Personally  Reviewed  ECG    No new tracings as of 01/12/18 - Personally Reviewed  Cardiac Studies   Cath (01/03/18):  CULPRIT BIFURCATION LESION: Prox LAD lesion is 60% stenosed with 75% stenosed side branch in Ost 1st Diag. Prox LAD to Mid LAD lesion is 100% stenosed.  A drug-eluting stent was successfully placed (beginning proximal to diagonal branch) using a STENT SYNERGY DES 3X28. -Postdilated to 3.6 mm. Post intervention, there is a 0% residual stenosis in the LAD  Balloon angioplasty was performed on Ost 1st Diag using a BALLOON SAPPHIRE 2.0X12. Post intervention, the side branch was reduced to 20% residual stenosis.  ----------------------------------------------------------  Ost Ramus lesion is 65% stenosed.  ----------------------------------------------------------  There is mild left ventricular systolic dysfunction. The left ventricular ejection fraction is 45-50% by visual estimate.  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  Successful PCI of LAD with DES & PTCA of ost-prox 1st Diag  EF ~45% with distal Anterior-anterolateral hypokinesis.  Minimally Elevated LVEDP & PCWP with normal RHC pressures -borderline but not true cardiogenic shock  Low Normal CO/CI (with Neosynephrine off).  Acute Respiratory Failure with Acidemia  Metabolic Acidosis - treated with 2 Amp NaHCO3  RECOMMENDATIONS: Admit to CVICU -PCCM managing ventilator and possible cooling protocol  Convert from [facility] to ticagrelor -> DC Kengreal upon arrival to CCU  Maintain low-dose Neo-Synephrine for blood pressure support due to sedation  Defer decision making Re: Cooling protocol to PCCM. -->Anticipate right heart cath  removal either later on today versus tomorrow morning.  High-dose statin, hold off on beta-blocker until we see urine toxicology screen.  Recommend uninterrupted dual antiplatelet therapy  with Aspirin 81mg  daily and Ticagrelor 90mg  twice dailyfor a minimum of 12 months (ACS - Class I recommendation).  Will review images with interventional colleagues to determine if ramus intermedius lesion should be treated prior to discharge or manage medically.   Diagnostic Diagram         Post-Intervention Diagram        Echo (01/04/18): Study Conclusions  - Left ventricle: The cavity size was mildly reduced. There was severe concentric hypertrophy. Systolic function was normal. The estimated ejection fraction was in the range of 55% to 60%. Mild hypokinesis of the apical anterior myocardium; consistent with ischemia in the distribution of the distal left anterior descending coronary artery. Doppler parameters are consistent with abnormal left ventricular relaxation (grade 1 diastolic dysfunction). Acoustic contrast opacification revealed no evidence ofthrombus.  Patient Profile     47 y.o. male with hypertension and no known cardiac history presented post cardiac arrest (defibx4, epi x6pta, placed on cooling protocol) secondary to cocaine use with a anterolateral and inferior st elevations. Code STEMI activated, patient sedated and intubated, and taken for emergent cath that found proximal to mid 100% LAD lesion resulting in pci of lad with des and ptca of ost-prox 1st diagonal.  Assessment & Plan    1.Cardiac arrest acute anterolateral wall STEMI: - s/p PCI/DES of LAD 01/03/18>>>s/p hypothermia  -Continue aspirin 81mg  and Ticagrelor 90mg  bid for minimum of 12 months. Will need case management to see if he can get assistance with Brilinta. Can get the first month free. Afterwards if cost is an issue he can be switched to Plavix.  -Continue atorvastatin  40mg ,liver enzymes remain normal  -Will continue Carvedilol at current dose and known CAD (recent studies suggest safety in setting of cocaine use per chart review) - recommend home PT/OT - patient drives a truck for a living. Will be out of work a minimum of 6 weeks.  2. Cardiogenic shock: -Resolved  3.Essential Hypertension:  -SBP improved with medication adjustments.   5.Acute respiratory distress secondary to aspiration pneumonia: -Patient was extubated 01/08/18 - On zosyn>>last dose today  6.Hypokalemia: -K+ 3.9 today  7. Alcohol and tobacco abuse:  -No s/s of WD -Multivitamins, folic acid, and thiamine given -Cessation strongly encouraged   8. Cocaine withdrawal:  -UDS positive for mariajuana, benzos and cocaine  -Continue to monitor for signs of WD   Signed, Peter Swazil[place] MD, [facility]   01/13/2018, 7:48 AM     For questions or updates, please contact   Please consult [email or web address] for contact info under Cardiology/STEMI.

## 2018-01-15 ENCOUNTER — Telehealth (HOSPITAL_COMMUNITY): Payer: Self-pay

## 2018-01-15 NOTE — Telephone Encounter (Signed)
Attempted to call patient in regards to Cardiac Rehab - LM on VM 

## 2018-01-20 ENCOUNTER — Ambulatory Visit (INDEPENDENT_AMBULATORY_CARE_PROVIDER_SITE_OTHER): Payer: Self-pay | Admitting: Family Medicine

## 2018-01-20 ENCOUNTER — Encounter: Payer: Self-pay | Admitting: Family Medicine

## 2018-01-20 VITALS — BP 138/89 | HR 63 | Temp 97.5°F | Resp 17 | Ht 70.0 in | Wt 205.2 lb

## 2018-01-20 DIAGNOSIS — Z720 Tobacco use: Secondary | ICD-10-CM

## 2018-01-20 DIAGNOSIS — Z09 Encounter for follow-up examination after completed treatment for conditions other than malignant neoplasm: Secondary | ICD-10-CM

## 2018-01-20 DIAGNOSIS — I4901 Ventricular fibrillation: Secondary | ICD-10-CM

## 2018-01-20 DIAGNOSIS — Z7689 Persons encountering health services in other specified circumstances: Secondary | ICD-10-CM

## 2018-01-20 DIAGNOSIS — I2129 ST elevation (STEMI) myocardial infarction involving other sites: Secondary | ICD-10-CM

## 2018-01-20 MED ORDER — TICAGRELOR 90 MG PO TABS: 90.0000 mg | ORAL_TABLET | Freq: Two times a day (BID) | ORAL | 10 refills | Status: DC

## 2018-01-20 MED ORDER — ACETAMINOPHEN 325 MG PO TABS: 650.0000 mg | ORAL_TABLET | ORAL | Status: DC | PRN

## 2018-01-20 MED ORDER — MUPIROCIN 2 % EX OINT: 1.0000 "application " | TOPICAL_OINTMENT | Freq: Three times a day (TID) | CUTANEOUS | 1 refills | Status: DC

## 2018-01-20 MED ORDER — LOSARTAN POTASSIUM 25 MG PO TABS: 25.0000 mg | ORAL_TABLET | Freq: Every day | ORAL | 4 refills | Status: DC

## 2018-01-20 MED ORDER — ASPIRIN 81 MG PO CHEW: 81.0000 mg | CHEWABLE_TABLET | Freq: Every day | ORAL | Status: DC

## 2018-01-20 MED ORDER — NICOTINE 14 MG/24HR TD PT24: 14.0000 mg | MEDICATED_PATCH | Freq: Every day | TRANSDERMAL | 1 refills | Status: DC

## 2018-01-20 MED ORDER — ATORVASTATIN CALCIUM 40 MG PO TABS: 40.0000 mg | ORAL_TABLET | Freq: Every day | ORAL | 4 refills | Status: DC

## 2018-01-20 MED ORDER — NITROGLYCERIN 0.4 MG SL SUBL: 0.4000 mg | SUBLINGUAL_TABLET | SUBLINGUAL | 2 refills | Status: DC | PRN

## 2018-01-20 MED ORDER — CARVEDILOL 12.5 MG PO TABS: 12.5000 mg | ORAL_TABLET | Freq: Two times a day (BID) | ORAL | 4 refills | Status: DC

## 2018-01-20 MED FILL — MUPIROCIN 2% OINTMENT: 2 | 22 days supply | Qty: 22 | Fill #0

## 2018-01-20 MED FILL — NICOTINE 14 MG/24HR PATCH: 14 | 28 days supply | Qty: 28 | Fill #0

## 2018-01-20 NOTE — Patient Instructions (Addendum)
[facility]!    Rodney Lloyd was seen by Joaquin CourtsKimberly Harris, FNP today.   Rodney Lloyd's primary care provider is Bing NeighborsHarris, Kimberly S, FNP.   For the best care possible, you should try to see Joaquin CourtsKimberly Harris, FNP-C whenever you come to the clinic.   We look forward to seeing you again soon!  If you have any questions about your visit today, please call us at [phone] or feel free to reach your primary care provider via MyChart.      Steps to Quit Smoking Smoking tobacco can be bad for your health. It can also affect almost every organ in your body. Smoking puts you and people around you at risk for many serious long-lasting (chronic) diseases. Quitting smoking is hard, but it is one of the best things that you can do for your health. It is never too late to quit. What are the benefits of quitting smoking? When you quit smoking, you lower your risk for getting serious diseases and conditions. They can include:  Lung cancer or lung disease.  Heart disease.  Stroke.  Heart attack.  Not being able to have children (infertility).  Weak bones (osteoporosis) and broken bones (fractures).  If you have coughing, wheezing, and shortness of breath, those symptoms may get better when you quit. You may also get sick less often. If you are pregnant, quitting smoking can help to lower your chances of having a baby of low birth weight. What can I do to help me quit smoking? Talk with your doctor about what can help you quit smoking. Some things you can do (strategies) include:  Quitting smoking totally, instead of slowly cutting back how much you smoke over a period of time.  Going to in-person counseling. You are more likely to quit if you go to many counseling sessions.  Using resources and support systems, such as: ? Agricultural engineernline chats with a Veterinary surgeoncounselor. ? Phone quitlines. ? Automotive engineerrinted self-help materials. ? Support groups or  group counseling. ? Text messaging programs. ? Mobile phone apps or applications.  Taking medicines. Some of these medicines may have nicotine in them. If you are pregnant or breastfeeding, do not take any medicines to quit smoking unless your doctor says it is okay. Talk with your doctor about counseling or other things that can help you.  Talk with your doctor about using more than one strategy at the same time, such as taking medicines while you are also going to in-person counseling. This can help make quitting easier. What things can I do to make it easier to quit? Quitting smoking might feel very hard at first, but there is a lot that you can do to make it easier. Take these steps:  Talk to your family and friends. Ask them to support and encourage you.  Call phone quitlines, reach out to support groups, or work with a Veterinary surgeoncounselor.  Ask people who smoke to not smoke around you.  Avoid places that make you want (trigger) to smoke, such as: ? Bars. ? Parties. ? Smoke-break areas at work.  Spend time with people who do not smoke.  Lower the stress in your life. Stress can make you want to smoke. Try these things to help your stress: ? Getting regular exercise. ? Deep-breathing exercises. ? Yoga. ? Meditating. ? Doing a body scan. To do this, close your eyes, focus on one area of your body at a time  from head to toe, and notice which parts of your body are tense. Try to relax the muscles in those areas.  Download or buy apps on your mobile phone or tablet that can help you stick to your quit plan. There are many free apps, such as QuitGuide from the [employer] developer for Disease Control and Prevention). You can find more support from smokefree.gov and other websites.  This information is not intended to replace advice given to you by your health care provider. Make sure you discuss any questions you have with your health care provider. Document Released: 12/15/2008 Document Revised:  10/17/2015 Document Reviewed: 07/05/2014 Elsevier Interactive Patient Education  2018 [employer].

## 2018-01-20 NOTE — Progress Notes (Signed)
Celestine Bougie, is a 47 y.o. male  WUJ:[id number]  NFA:[id number]  DOB - [birth year]  CC:  Chief Complaint  Patient presents with  . Establish Care  . Hospitalization Follow-up    ED->Hosp 11/2-11/12: acute STEMI       HPI: Rodgerick is a 47 y.o. male is here today to establish care.   Gwenlyn Perking Causer has Essential hypertension; Eye swelling or mass; Tobacco abuse; Cardiac arrest due to underlying cardiac condition (HCC); Acute ST elevation myocardial infarction (STEMI) of anterolateral wall (HCC); Cocaine abuse (HCC); Ventricular fibrillation (HCC); Cardiopulmonary resuscitation (CPR)-only resuscitation status; Cardiogenic shock (HCC); Respiratory failure (HCC); ST elevation myocardial infarction (STEMI) (HCC); Aspiration pneumonia (HCC); Acute encephalopathy; and Aspiration into airway on their problem list.    Today's visit:  Here to establish care as new patient and hospital follow-up. Fredderick was found to be unconscious in his hotel room by his significant other. She immediately began to perform CPR and upon arrival of EMS, patient was shocked 3 times and regained rhythm which as bradycardia. He developed ventricular fibrillation, required another shock. He was transferred to [facility] and was found to have STEMI. He required intubation and received IV heparin.  There was some question by [employer] as to whether or not patient had been using cocaine prior to cardiac arrest. He underwent left and right heart catheterization. He was placed on pressors to maintain hemodynamic stability. Following intervention, he was placed in CCU.  EF 55-60%. Grade 1 diastolic dysfunction.  He gradually improved. Discharged on 01/13/2018 with recommendations for PT/OT. Written out of work 6 weeks. Patient is a Naval architect. UDS positive for cocaine, benzodiazepines, and marijuana.  Today, he reports improved  activity tolerance, although concern for his ability to quickly become tired. Continues to experience chest and rib  pain which he characterized as aching and suspects pain is related to compressions. He continues to struggle with smoking. He is out of nicotine patches and felt they were beneficial while in the hospital. Reports not smoking as much since being discharged from the hospital. He is currently uninsured and he and significant other are mostly concerned about medication cost. Significant other is monitoring and documenting blood pressures readings which have mostly remained below 146/80. Patient denies new headaches, abdominal pain, nausea, cough, or new/worsening shortness of breath.  Current medications: Current Outpatient Medications:  .  acetaminophen (TYLENOL) 325 MG tablet, Take 2 tablets (650 mg total) by mouth every 4 (four) hours as needed for headache or mild pain., Disp: , Rfl:  .  aspirin 81 MG chewable tablet, Chew 1 tablet (81 mg total) by mouth daily., Disp: , Rfl:  .  atorvastatin (LIPITOR) 40 MG tablet, Take 1 tablet (40 mg total) by mouth daily., Disp: 90 tablet, Rfl: 4 .  carvedilol (COREG) 12.5 MG tablet, Take 1 tablet (12.5 mg total) by mouth 2 (two) times daily with a meal., Disp: 180 tablet, Rfl: 4 .  losartan (COZAAR) 25 MG tablet, Take 1 tablet (25 mg total) by mouth daily., Disp: 90 tablet, Rfl: 4 .  nitroGLYCERIN (NITROSTAT) 0.4 MG SL tablet, Place 1 tablet (0.4 mg total) under the tongue every 5 (five) minutes as needed for chest pain., Disp: 25 tablet, Rfl: 0 .  ticagrelor (BRILINTA) 90 MG TABS tablet, Take 1 tablet (90 mg total) by mouth 2 (two) times daily., Disp: 180 tablet, Rfl: 10   Pertinent family medical history: family history includes Heart disease in his father; Hyperlipidemia in his mother.   No Known  Allergies  Social History   Socioeconomic History  . Marital status: Divorced    Spouse name: Not on file  . Number of children: Not on file  . Years of education: Not on file  . Highest education level: Not on file  Occupational History  . Not on file   Social Needs  . Financial resource strain: Not on file  . Food insecurity:    Worry: Not on file    Inability: Not on file  . Transportation needs:    Medical: Not on file    Non-medical: Not on file  Tobacco Use  . Smoking status: Current Every Day Smoker  . Smokeless tobacco: Never Used  Substance and Sexual Activity  . Alcohol use: Yes  . Drug use: No  . Sexual activity: Not on file  Lifestyle  . Physical activity:    Days per week: Not on file    Minutes per session: Not on file  . Stress: Not on file  Relationships  . Social connections:    Talks on phone: Not on file    Gets together: Not on file    Attends religious service: Not on file    Active member of club or organization: Not on file    Attends meetings of clubs or organizations: Not on file    Relationship status: Not on file  . Intimate partner violence:    Fear of current or ex partner: Not on file    Emotionally abused: Not on file    Physically abused: Not on file    Forced sexual activity: Not on file  Other Topics Concern  . Not on file  Social History Narrative  . Not on file    Review of Systems: Constitutional: Negative for fever, chills, diaphoresis, activity change, appetite change and fatigue. Eyes: Negative for pain, discharge, redness, itching and visual disturbance. Respiratory: Negative for cough, choking, chest tightness, shortness of breath, wheezing and stridor.  Cardiovascular: Negative for chest pain, palpitations and leg swelling. Gastrointestinal: Negative for abdominal distention. Musculoskeletal: Negative for back pain, joint swelling, arthralgia and gait problem. Neurological: Negative for dizziness, tremors, seizures, syncope, facial asymmetry, speech difficulty, weakness, light-headedness, numbness and headaches. Concern that short term memory seem worse than prior to MI Hematological: Negative for adenopathy. Does not bruise/bleed easily. Psychiatric/Behavioral: Negative for  hallucinations, behavioral problems, confusion, dysphoric mood, decreased concentration and agitation.  Objective:   Vitals:   01/20/18 1016  BP: 138/89  Pulse: 63  Resp: 17  Temp: (!) 97.5 F (36.4 C)  SpO2: 98%    BP Readings from Last 3 Encounters:  01/20/18 138/89  01/13/18 129/81  04/20/15 138/97    Filed Weights   01/20/18 1016  Weight: 205 lb 3.2 oz (93.1 kg)      Physical Exam: Constitutional: Patient appears well-developed and well-nourished. No distress. HENT: Normocephalic, atraumatic, External right and left ear normal. Oropharynx is clear and moist.  Eyes: Conjunctivae and EOM are normal. PERRLA, no scleral icterus. Neck: Normal ROM. Neck supple. No JVD. No tracheal deviation. No thyromegaly. CVS: RRR, S1/S2 +, no murmurs, no gallops, no carotid bruit.  Pulmonary: Effort and breath sounds normal, no stridor, rhonchi, wheezes, rales.  Abdominal: Soft. BS +, no distension, tenderness, rebound or guarding.  Musculoskeletal: Normal range of motion. No edema and no tenderness.  Neuro: Alert. Normal muscle tone coordination. Normal gait. BUE and BLE strength 5/5. Bilateral hand grips symmetrical. No cranial nerve deficit. Skin: Skin is warm and dry. Healed scars  and abrasions Psychiatric: Normal mood and affect. Behavior, judgment, thought content normal.  Lab Results (prior encounters)  Lab Results  Component Value Date   WBC 7.2 01/08/2018   HGB 12.8 (L) 01/08/2018   HCT 39.8 01/08/2018   MCV 89.0 01/08/2018   PLT 134 (L) 01/08/2018   Lab Results  Component Value Date   CREATININE 1.08 01/12/2018   BUN 14 01/12/2018   NA 139 01/12/2018   K 3.9 01/12/2018   CL 108 01/12/2018   CO2 25 01/12/2018    Lab Results  Component Value Date   HGBA1C 5.7 (H) 01/06/2018       Component Value Date/Time   CHOL 117 01/07/2018 1131   TRIG 140 01/07/2018 1355   HDL 24 (L) 01/07/2018 1131   CHOLHDL 4.9 01/07/2018 1131   VLDL 26 01/07/2018 1131   LDLCALC 67  01/07/2018 1131        Assessment and plan:  1. Encounter to establish care 2. Hospital discharge follow-up 3. Tobacco abuse, encouraged cessation. Possible open to oral medication therapy with Wellbutrin and Chantix. Would like to continue with a trial of  Nicotine patches as deterrent therapy for now.  4. Ventricular fibrillation (HCC), resolved 5. ST elevation myocardial infarction (STEMI) involving other coronary artery (HCC) 6. Coronary artery disease  S/P angioplasty. Continue dual platelet therapy per cardiology. Encouraged smoking cessation avoiding substance abuse.     No follow-ups on file.   The patient was given clear instructions to go to ER or return to medical center if symptoms don't improve, worsen or new problems develop. The patient verbalized understanding. The patient was advised  to call and obtain lab results if they haven't heard anything from out office within 7-10 business days.  A total of 30 minutes spent, greater than 50 % of this time was spent reviewing prior hospital notes, diagnostic studies, counseling and coordination of care.  Joaquin Courts, FNP Primary Care at [facility] [address], [place] [address] [phone]: [phone]    This note has been created with Dragon speech recognition software and Paediatric nurse. Any transcriptional errors are unintentional.

## 2018-01-24 NOTE — Progress Notes (Signed)
Cardiology Office Note   Date:  01/26/2018   ID:  Rodney Lloyd, DOB 12-Apr-1970, MRN [id number]  PCP:  Bing Neighbors, FNP  Cardiologist: Dr. Herbie Baltimore No chief complaint on file.    History of Present Illness: Rodney Lloyd is a 47 y.o. male who presents for ongoing assessment and management of coronary artery disease status post hospitalization for acute STEMI of the anterior lateral wall, with other history to include hypertension, cocaine abuse.  The patient was admitted after his significant other found him unconscious on the ground and started on-site CPR.  EMS was called and he was found to be in VT and received 3 stepwise shock that restored him to normal sinus rhythm.  The patient then had significant bradycardia requiring external pacing.  He was given 4 rounds of epinephrine prior to arrival to [facility].  EKG revealed significant anterior lateral and inferior ST elevation, and the patient was sent emergently to the cardiac catheterization lab.  Cardiac catheterization revealed severe 2-3 vessel coronary artery disease, this included 100% mid LAD, 80% first diagonal, and 70% ramus intermedius.  The patient had a successful PCI of the LAD with drug-eluting stent, and PTCA of the ostial proximal first diagonal.  The patient's EF was found to be approximately 45% with distal anterior and anterior lateral hypokinesis.  Due to acute respiratory failure the patient was intubated in the Cath Lab and placed on hypothermia protocol.  He was temporarily placed on pressors due to hypotension and cardiogenic shock.  Patient was subsequently placed on dual antiplatelet therapy with aspirin 81 mg and ticagrelor 90 mg twice daily which she will continue for a minimum of 12 months.  The patient was not started on beta-blocker therapy as he had recently ingested cocaine.  He did have neurology see him an EEG was performed which was found to be abnormal however in the postarrest setting this was not thought  to be significant for prognosis.    Prior to discharge the patient had an apparent prolonged bradycardia arrhythmic event which was felt to be vagal in etiology as this occurred during prolonged coughing.  He also worked with physical therapy, and he was recommended for PT/OT at discharge.  He was to remain out of work for a minimum of 6 weeks, and attend cardiac rehabilitation.  He comes today tired and sore in his chest. He is slowly beginning to increase his exercise.  He has multiple questions about his new symptoms and medications.    Past Medical History:  Diagnosis Date  . Alcohol abuse   . CAD (coronary artery disease), native coronary artery    s/p PCI to LAD 01/2018  . Cocaine abuse (HCC)   . HLD (hyperlipidemia)   . Hypertension   . STEMI (ST elevation myocardial infarction) (HCC)   . Tobacco abuse     Past Surgical History:  Procedure Laterality Date  . CORONARY/GRAFT ACUTE MI REVASCULARIZATION N/A 01/03/2018   Procedure: Coronary/Graft Acute MI Revascularization;  Surgeon: Marykay Lex, MD;  Location: [facility] INVASIVE CV LAB;  Service: Cardiovascular;  Laterality: N/A;  . RIGHT/LEFT HEART CATH AND CORONARY ANGIOGRAPHY N/A 01/03/2018   Procedure: RIGHT/LEFT HEART CATH AND CORONARY ANGIOGRAPHY;  Surgeon: Marykay Lex, MD;  Location: [facility] INVASIVE CV LAB;  Service: Cardiovascular;  Laterality: N/A;     Current Outpatient Medications  Medication Sig Dispense Refill  . acetaminophen (TYLENOL) 325 MG tablet Take 2 tablets (650 mg total) by mouth every 4 (four) hours as needed for  headache or mild pain.    Marland Kitchen. aspirin 81 MG chewable tablet Chew 1 tablet (81 mg total) by mouth daily.    Marland Kitchen. atorvastatin (LIPITOR) 40 MG tablet Take 1 tablet (40 mg total) by mouth daily. 90 tablet 4  . carvedilol (COREG) 12.5 MG tablet Take 1 tablet (12.5 mg total) by mouth 2 (two) times daily with a meal. 180 tablet 4  . losartan (COZAAR) 25 MG tablet Take 1 tablet (25 mg total) by mouth daily. 90  tablet 4  . mupirocin ointment (BACTROBAN) 2 % Apply 1 application topically 3 (three) times daily. Apply to abrasions right axilla 22 g 1  . nicotine (NICODERM CQ - DOSED IN MG/24 HOURS) 14 mg/24hr patch Place 1 patch (14 mg total) onto the skin daily. 28 patch 1  . nitroGLYCERIN (NITROSTAT) 0.4 MG SL tablet Place 1 tablet (0.4 mg total) under the tongue every 5 (five) minutes as needed for chest pain. 25 tablet 2  . ticagrelor (BRILINTA) 90 MG TABS tablet Take 1 tablet (90 mg total) by mouth 2 (two) times daily. 180 tablet 10   No current facility-administered medications for this visit.     Allergies:   Patient has no known allergies.    Social History:  The patient  reports that he has been smoking. He has never used smokeless tobacco. He reports that he drinks alcohol. He reports that he does not use drugs.   Family History:  The patient's family history includes Heart disease in his father; Hyperlipidemia in his mother.    ROS: All other systems are reviewed and negative. Unless otherwise mentioned in H&P    PHYSICAL EXAM: VS:  BP 120/78   Pulse 88   Ht 5' 10.5" (1.791 m)   Wt 211 lb 12.8 oz (96.1 kg)   BMI 29.96 kg/m  , BMI Body mass index is 29.96 kg/m. GEN: Well nourished, well developed, in no acute distress HEENT: normal Neck: no JVD, carotid bruits, or masses Cardiac: RRR; no murmurs, rubs, or gallops,no edema  Respiratory:  Clear to auscultation bilaterally, normal work of breathing GI: soft, nontender, nondistended, + BS MS: no deformity or atrophy Skin: warm and dry, no rash Neuro:  Strength and sensation are intact Psych: euthymic mood, full affect   EKG:  Not completed this office visit.   Recent Labs: 01/07/2018: ALT 45 01/08/2018: Hemoglobin 12.8; Platelets 134 01/10/2018: Magnesium 2.4 01/12/2018: BUN 14; Creatinine, Ser 1.08; Potassium 3.9; Sodium 139    Lipid Panel    Component Value Date/Time   CHOL 117 01/07/2018 1131   TRIG 140 01/07/2018  1355   HDL 24 (L) 01/07/2018 1131   CHOLHDL 4.9 01/07/2018 1131   VLDL 26 01/07/2018 1131   LDLCALC 67 01/07/2018 1131      Wt Readings from Last 3 Encounters:  01/26/18 211 lb 12.8 oz (96.1 kg)  01/20/18 205 lb 3.2 oz (93.1 kg)  01/13/18 198 lb 13.7 oz (90.2 kg)      Other studies Reviewed: Cardiac catheterization 01/03/18:   CULPRIT BIFURCATION LESION: Prox LAD lesion is 60% stenosed with 75% stenosed side branch in Ost 1st Diag. Prox LAD to Mid LAD lesion is 100% stenosed.  A drug-eluting stent was successfully placed (beginning proximal to diagonal branch) using a STENT SYNERGY DES 3X28. -Postdilated to 3.6 mm. Post intervention, there is a 0% residual stenosis in the LAD  Balloon angioplasty was performed on Ost 1st Diag using a BALLOON SAPPHIRE 2.0X12. Post intervention, the side branch was  reduced to 20% residual stenosis.  ----------------------------------------------------------  Ost Ramus lesion is 65% stenosed.  ----------------------------------------------------------  There is mild left ventricular systolic dysfunction. The left ventricular ejection fraction is 45-50% by visual estimate.  LV End Diastolic Pressure is mildly elevated pre-PCI, however post PCI down to 13 mmHg and PCWP 11 mmHg  NORMAL RIGHT HEART CATH PRESSURES  Echocardiogram 01/04/18:  Study Conclusions  - Left ventricle: The cavity size was mildly reduced. There was severe concentric hypertrophy. Systolic function was normal. The estimated ejection fraction was in the range of 55% to 60%. Mild hypokinesis of the apical anterior myocardium; consistent with ischemia in the distribution of the distal left anterior descending coronary artery. Doppler parameters are consistent with abnormal left ventricular relaxation (grade 1 diastolic dysfunction). Acoustic contrast opacification revealed no evidence ofthrombus.  ASSESSMENT AND PLAN:  1.  CAD: S/P anterior LAD MI.  S/P DES intervention. He is on DAPT with ASA and Brilinta. He denies symptoms of bleeding or dyspnea. I have spent a considerable amount of time reviewing his cardiac cath report and going over the illustration and answering questions.   He is given a letter that he may not return to until seen in January follow up.   2. Hypertension: Well controlled on Coreg and Losartan. No changes in regimen. He will need a BMET on follow up.   3. Hypercholesterolemia: Continue atorvastatin.Will have follow up lipids and LFT's   Current medicines are reviewed at length with the patient today.    Labs/ tests ordered today include: None   Bettey Mare. Liborio Nixon, ANP, AACC   01/26/2018 9:47 AM    [facility] [address] Office [phone] Fax [phone]

## 2018-01-26 ENCOUNTER — Ambulatory Visit (INDEPENDENT_AMBULATORY_CARE_PROVIDER_SITE_OTHER): Payer: Self-pay | Admitting: Adult Health

## 2018-01-26 ENCOUNTER — Encounter: Payer: Self-pay | Admitting: Adult Health

## 2018-01-26 VITALS — BP 120/78 | HR 88 | Ht 70.5 in | Wt 211.8 lb

## 2018-01-26 DIAGNOSIS — E7800 Pure hypercholesterolemia, unspecified: Secondary | ICD-10-CM

## 2018-01-26 DIAGNOSIS — I2510 Atherosclerotic heart disease of native coronary artery without angina pectoris: Secondary | ICD-10-CM

## 2018-01-26 DIAGNOSIS — I10 Essential (primary) hypertension: Secondary | ICD-10-CM

## 2018-01-26 MED ORDER — NICOTINE POLACRILEX 2 MG MT LOZG: 2.0000 mg | LOZENGE | OROMUCOSAL | 0 refills | Status: DC | PRN

## 2018-01-26 NOTE — Patient Instructions (Addendum)
Follow-Up: You will need a follow up appointment in 1 month.  You may see  DR harding, Joni ReiningKathryn Lawrence, DNP, AACC -or- one of the following Advanced Practice Providers on your designated Care Team:    . Theodore DemarkRhonda Barrett, PA-C . Joni ReiningKathryn Lawrence, DNP, ANP  Medication Instructions:  NO CHANGES- Your physician recommends that you continue on your current medications as directed. Please refer to the Current Medication list given to you today.  If you need a refill on your cardiac medications before your next appointment, please call your pharmacy.  Labwork: If you have labs (blood work) drawn today and your tests are completely normal, you will receive your results ONLY by: . MyChart Message (if you have MyChart) -OR- . A paper copy in the mail  At [facility], you and your health needs are our priority.  As part of our continuing mission to provide you with exceptional heart care, we have created designated Provider Care Teams.  These Care Teams include your primary Cardiologist (physician) and Advanced Practice Providers (APPs -  Physician Assistants and Nurse Practitioners) who all work together to provide you with the care you need, when you need it.  Thank you for choosing [facility] at [facility]!!

## 2018-01-27 ENCOUNTER — Telehealth: Payer: Self-pay | Admitting: Family Medicine

## 2018-01-27 NOTE — Telephone Encounter (Signed)
Melanie from [facility] called requesting verbal orders, please follow up.

## 2018-01-27 NOTE — Telephone Encounter (Signed)
Melanie called back. She states that she feels like patient would be a good candidate for [facility] outpatient cardiac rehab program. Is this a referral that you would order or would I need to route this to the Cardiologist that he saw yesterday?

## 2018-01-27 NOTE — Telephone Encounter (Addendum)
Please advise. They will fax over orders for 3 visits of in-home PT for generalized strengthening & endurance.

## 2018-01-27 NOTE — Telephone Encounter (Signed)
Patient was referred to cardiac rehab on 11/11, however it appears the referral is pending as patient is without insurance. Patient was advised during office visit to complete  financial assistance in order to expedite rehab referral

## 2018-01-28 ENCOUNTER — Emergency Department (HOSPITAL_COMMUNITY): Payer: Self-pay

## 2018-01-28 ENCOUNTER — Emergency Department (HOSPITAL_COMMUNITY)
Admission: EM | Admit: 2018-01-28 | Discharge: 2018-01-28 | Disposition: A | Discharge status: Home / Self Care | Payer: Self-pay | Attending: Emergency Medicine | Admitting: Emergency Medicine

## 2018-01-28 ENCOUNTER — Encounter (HOSPITAL_COMMUNITY): Payer: Self-pay | Admitting: Family Medicine

## 2018-01-28 DIAGNOSIS — I252 Old myocardial infarction: Secondary | ICD-10-CM | POA: Insufficient documentation

## 2018-01-28 DIAGNOSIS — R531 Weakness: Secondary | ICD-10-CM | POA: Insufficient documentation

## 2018-01-28 DIAGNOSIS — R0789 Other chest pain: Secondary | ICD-10-CM | POA: Insufficient documentation

## 2018-01-28 DIAGNOSIS — B9789 Other viral agents as the cause of diseases classified elsewhere: Secondary | ICD-10-CM

## 2018-01-28 DIAGNOSIS — Z8674 Personal history of sudden cardiac arrest: Secondary | ICD-10-CM | POA: Insufficient documentation

## 2018-01-28 DIAGNOSIS — Z79899 Other long term (current) drug therapy: Secondary | ICD-10-CM | POA: Insufficient documentation

## 2018-01-28 DIAGNOSIS — F17210 Nicotine dependence, cigarettes, uncomplicated: Secondary | ICD-10-CM | POA: Insufficient documentation

## 2018-01-28 DIAGNOSIS — J069 Acute upper respiratory infection, unspecified: Secondary | ICD-10-CM | POA: Insufficient documentation

## 2018-01-28 DIAGNOSIS — I2510 Atherosclerotic heart disease of native coronary artery without angina pectoris: Secondary | ICD-10-CM | POA: Insufficient documentation

## 2018-01-28 DIAGNOSIS — Z7982 Long term (current) use of aspirin: Secondary | ICD-10-CM | POA: Insufficient documentation

## 2018-01-28 NOTE — ED Provider Notes (Signed)
[facility]-EMERGENCY DEPT Provider Note   CSN: [id number] Arrival date & time: 01/28/18  0255     History   Chief Complaint Chief Complaint  Patient presents with  . Cough  . URI    HPI Rodney Lloyd is a 47 y.o. male.  The history is provided by the patient and the spouse.  Cough  This is a new problem. The current episode started 2 days ago. The problem occurs hourly. The cough is non-productive. There has been no fever. Associated symptoms include chills and sore throat.  URI   Associated symptoms include congestion, sore throat and cough.   Patient reports for the past 2 days has had cough, sore throat, congestion and sneezing.  He is coughing so hard his chest is hurting.  He reports mild shortness of breath.  He reports chronic weakness for several weeks.  No hemoptysis.  No syncope.  Patient with significant history of CAD.  He sustained a V. fib arrest earlier this month, with a STEMI.  He has since fully recovered, but still has generalized weakness since the event Past Medical History:  Diagnosis Date  . Alcohol abuse   . CAD (coronary artery disease), native coronary artery    s/p PCI to LAD 01/2018  . Cocaine abuse (HCC)   . HLD (hyperlipidemia)   . Hypertension   . STEMI (ST elevation myocardial infarction) (HCC)   . Tobacco abuse     Patient Active Problem List   Diagnosis Date Noted  . Aspiration into airway   . Acute encephalopathy   . Cardiogenic shock (HCC)   . Cardiac arrest due to underlying cardiac condition (HCC) 01/03/2018  . Acute ST elevation myocardial infarction (STEMI) of anterolateral wall (HCC) 01/03/2018  . Cocaine abuse (HCC) 01/03/2018  . Cardiopulmonary resuscitation (CPR)-only resuscitation status   . Essential hypertension 02/15/2015  . Tobacco abuse 02/15/2015    Past Surgical History:  Procedure Laterality Date  . CORONARY/GRAFT ACUTE MI REVASCULARIZATION N/A 01/03/2018   Procedure: Coronary/Graft  Acute MI Revascularization;  Surgeon: Marykay LexHarding, David W, MD;  Location: [facility] INVASIVE CV LAB;  Service: Cardiovascular;  Laterality: N/A;  . RIGHT/LEFT HEART CATH AND CORONARY ANGIOGRAPHY N/A 01/03/2018   Procedure: RIGHT/LEFT HEART CATH AND CORONARY ANGIOGRAPHY;  Surgeon: Marykay LexHarding, David W, MD;  [facility] CV LAB;  Service: Cardiovascular;  Laterality: N/A;        Home Medications    Prior to Admission medications   Medication Sig Start Date End Date Taking? Authorizing Provider  acetaminophen (TYLENOL) 325 MG tablet Take 2 tablets (650 mg total) by mouth every 4 (four) hours as needed for headache or mild pain. 01/20/18  Yes Bing NeighborsHarris, Kimberly S, FNP  aspirin 81 MG chewable tablet Chew 1 tablet (81 mg total) by mouth daily. 01/20/18  Yes Bing NeighborsHarris, Kimberly S, FNP  atorvastatin (LIPITOR) 40 MG tablet Take 1 tablet (40 mg total) by mouth daily. 01/20/18  Yes Bing NeighborsHarris, Kimberly S, FNP  carvedilol (COREG) 12.5 MG tablet Take 1 tablet (12.5 mg total) by mouth 2 (two) times daily with a meal. 01/20/18  Yes Bing NeighborsHarris, Kimberly S, FNP  losartan (COZAAR) 25 MG tablet Take 1 tablet (25 mg total) by mouth daily. 01/20/18  Yes Bing NeighborsHarris, Kimberly S, FNP  ticagrelor (BRILINTA) 90 MG TABS tablet Take 1 tablet (90 mg total) by mouth 2 (two) times daily. 01/20/18  Yes Bing NeighborsHarris, Kimberly S, FNP  mupirocin ointment (BACTROBAN) 2 % Apply 1 application topically 3 (three) times daily. Apply  to abrasions right axilla Patient taking differently: Apply 1 application topically 3 (three) times daily as needed. Apply to abrasions right axilla 01/20/18   Bing Neighbors, FNP  nicotine (NICODERM CQ - DOSED IN MG/24 HOURS) 14 mg/24hr patch Place 1 patch (14 mg total) onto the skin daily. 01/20/18   Bing Neighbors, FNP  nitroGLYCERIN (NITROSTAT) 0.4 MG SL tablet Place 1 tablet (0.4 mg total) under the tongue every 5 (five) minutes as needed for chest pain. 01/20/18   Bing Neighbors, FNP    Family History Family  History  Problem Relation Age of Onset  . Hyperlipidemia Mother   . Heart disease Father     Social History Social History   Tobacco Use  . Smoking status: Current Every Day Smoker    Packs/day: 0.10    Types: Cigarettes  . Smokeless tobacco: Never Used  Substance Use Topics  . Alcohol use: Not Currently  . Drug use: Not Currently     Allergies   Patient has no known allergies.   Review of Systems Review of Systems  Constitutional: Positive for chills.  HENT: Positive for congestion and sore throat.   Respiratory: Positive for cough.   All other systems reviewed and are negative.    Physical Exam Updated Vital Signs BP (!) 135/99   Pulse 76   Temp 98.2 F (36.8 C) (Oral)   Resp 17   Ht 1.791 m (5' 10.5")   Wt 96.5 kg   SpO2 99%   BMI 30.09 kg/m   Physical Exam CONSTITUTIONAL: Well developed/well nourished HEAD: Normocephalic/atraumatic EYES: EOMI/PERRL ENMT: Mucous membranes moist uvula midline with erythema no exudates or edema NECK: supple no meningeal signs SPINE/BACK:entire spine nontender CV: S1/S2 noted, no murmurs/rubs/gallops noted LUNGS: Decreased breath sounds in the bases, no distress noted Chest -no tenderness or bruising ABDOMEN: soft, nontender, no rebound or guarding, bowel sounds noted throughout abdomen GU:no cva tenderness NEURO: Pt is awake/alert/appropriate, moves all extremitiesx4.  No facial droop.   EXTREMITIES: pulses normal/equal, full ROM SKIN: warm, color normal PSYCH: no abnormalities of mood noted, alert and oriented to situation   ED Treatments / Results  Labs (all labs ordered are listed, but only abnormal results are displayed) Labs Reviewed - No data to display  EKG EKG Interpretation  Date/Time:  Wednesday January 28 2018 03:24:52 EST Ventricular Rate:  77 PR Interval:    QRS Duration: 77 QT Interval:  377 QTC Calculation: 427 R Axis:   51 Text Interpretation:  Sinus rhythm T wave inversion Confirmed by  Zadie Rhine ([id number]) on 01/28/2018 3:56:49 AM   Radiology Dg Chest 2 View  Result Date: 01/28/2018 CLINICAL DATA:  Cough and congestion. EXAM: CHEST - 2 VIEW COMPARISON:  01/10/2018, additional priors FINDINGS: The cardiomediastinal contours are unchanged with borderline cardiomegaly. Coronary stent. Resolved bibasilar opacities from prior. Pulmonary vasculature is normal. No consolidation, pleural effusion, or pneumothorax. No acute osseous abnormalities are seen. IMPRESSION: No acute findings. Resolved bibasilar opacities from radiographs earlier this month. Electronically Signed   By: Narda Rutherford M.D.   On: 01/28/2018 03:49    Procedures Procedures  Medications Ordered in ED Medications - No data to display   Initial Impression / Assessment and Plan / ED Course  I have reviewed the triage vital signs and the nursing notes.  Pertinent  imaging results that were available during my care of the patient were reviewed by me and considered in my medical decision making (see chart for details).  Patient presented for cough and viral URI symptoms.  He is well-appearing, no acute distress.  No hypoxia, no tachypnea.  Chest x-ray is negative.  He had significant event earlier in the month after a V. fib arrest and STEMI.  He was concerned that his chest pain was cardiac related.  However he reports he only has the pain with coughing, no active chest pain at this time.  My suspicion for ACS/PE is low.  No signs of pneumonia.  No signs of CHF. He is continuing to cut back on smoking. We discussed safe OTC medications.  We discussed strict return precautions.  Final Clinical Impressions(s) / ED Diagnoses   Final diagnoses:  Viral URI with cough    ED Discharge Orders    None       Zadie Rhine, MD 01/28/18 [phone]

## 2018-01-28 NOTE — ED Triage Notes (Signed)
Patient reports he is having upper respiratory systems of runny nose, sneezing, sore throat, and cough. Denies a fever, but family member reports he is warm. Symptoms started yesterday but got worse tonight.

## 2018-01-28 NOTE — ED Notes (Signed)
Patient transported to X-ray 

## 2018-02-02 ENCOUNTER — Telehealth: Payer: Self-pay | Admitting: Family Medicine

## 2018-02-02 NOTE — Telephone Encounter (Signed)
Have patient return to office for BP check tomorrow. For today, I would like for patient to take an extra dose of Losartan 25 mg for a total of 50 mg. Okay to discontinue occupational therapy

## 2018-02-02 NOTE — Telephone Encounter (Signed)
Please advise 

## 2018-02-02 NOTE — Telephone Encounter (Signed)
Melenie called from [facility] to let provider know that patient is doing very well and that she thinks patient doesn't need any occupational therapy but would like to know what provider thinks. Please follow up.

## 2018-02-02 NOTE — Telephone Encounter (Signed)
Tiffany (spouse) called stating that patient's BP is 161/100 and would like to know how she should proceed, if she should keep checking it every hour or if she should give him another pill. Please follow up.

## 2018-02-02 NOTE — Telephone Encounter (Signed)
Called pt in regards to CR, pt stated he is still does not have ant insurance at this time. He is working on Museum/gallery curatorgetting insurance, will call once everything is approved.  Closed referral

## 2018-02-04 NOTE — Telephone Encounter (Signed)
Left voice mail to call back 

## 2018-02-06 MED FILL — ATORVASTATIN CALCIUM 40 MG: 40 | 30 days supply | Qty: 30 | Fill #0

## 2018-02-06 MED FILL — BRILINTA 90 MG TABLET: 90 | 30 days supply | Qty: 60 | Fill #0

## 2018-02-06 MED FILL — CARVEDILOL 12.5 MG TABLET: 12.5 | 30 days supply | Qty: 60 | Fill #0

## 2018-02-06 MED FILL — LOSARTAN POTASSIUM 25 MG TA: 25 | 30 days supply | Qty: 30 | Fill #0

## 2018-02-06 NOTE — Telephone Encounter (Signed)
Left VM to call back 

## 2018-03-02 NOTE — Progress Notes (Signed)
Cardiology Office Note   Date:  03/05/2018   ID:  Rodney Lloyd, DOB 03/08/1970, MRN [id number]  PCP:  Bing Neighbors, FNP  Cardiologist: Leeroy Cha chief complaint on file.    History of Present Illness: Rodney Lloyd is a 47 y.o. male who presents for ongoing assessment and management of CAD, after admission for STEMI after a witness arrest at a hotel on 01/03/2018. CPR was started, EMS documented VT.Emergent cath.   He was found to have severe 2 vessel CAD with 100% mLAD, 80% 1st diagonal, and 70% ramus intermedius. He had successful PCI of the LAD with DES and PTCA of the ostial-proximal 1st diagonal. EF 45%.   He was recommended for uninterrupted DAPT with ASA and Brilinta for a minimum of 12 months.  He was found to be positive for cocaine and marijuana on UDS and therefore not started on BB.   Last seen in the office on 01/26/2018 for TOC follow up. He was sore but doing well. He had subsequently been started on coreg and losartan. He was to be out of work until today's follow up.   He is feeling much better, is anxious to go back to work as a [employer]. He denies chest pain, is eating more healthy and is down to 5 cigarettes a day, from over a pack a day. He has not used any illicit drugs or drank any ETOH.   Past Medical History:  Diagnosis Date  . Alcohol abuse   . CAD (coronary artery disease), native coronary artery    s/p PCI to LAD 01/2018  . Cocaine abuse (HCC)   . HLD (hyperlipidemia)   . Hypertension   . STEMI (ST elevation myocardial infarction) (HCC)   . Tobacco abuse     Past Surgical History:  Procedure Laterality Date  . CORONARY/GRAFT ACUTE MI REVASCULARIZATION N/A 01/03/2018   Procedure: Coronary/Graft Acute MI Revascularization;  Surgeon: Marykay Lex, MD;  Location: [facility] INVASIVE CV LAB;  Service: Cardiovascular;  Laterality: N/A;  . RIGHT/LEFT HEART CATH AND CORONARY ANGIOGRAPHY N/A 01/03/2018   Procedure: RIGHT/LEFT HEART CATH AND CORONARY  ANGIOGRAPHY;  Surgeon: Marykay Lex, MD;  Location: [facility] INVASIVE CV LAB;  Service: Cardiovascular;  Laterality: N/A;     Current Outpatient Medications  Medication Sig Dispense Refill  . acetaminophen (TYLENOL) 325 MG tablet Take 2 tablets (650 mg total) by mouth every 4 (four) hours as needed for headache or mild pain.    Marland Kitchen aspirin 81 MG chewable tablet Chew 1 tablet (81 mg total) by mouth daily.    Marland Kitchen atorvastatin (LIPITOR) 40 MG tablet Take 1 tablet (40 mg total) by mouth daily. 90 tablet 4  . carvedilol (COREG) 12.5 MG tablet Take 1 tablet (12.5 mg total) by mouth 2 (two) times daily with a meal. 180 tablet 4  . losartan (COZAAR) 25 MG tablet Take 1 tablet (25 mg total) by mouth daily. 90 tablet 4  . mupirocin ointment (BACTROBAN) 2 % Apply 1 application topically 3 (three) times daily. Apply to abrasions right axilla (Patient taking differently: Apply 1 application topically 3 (three) times daily as needed. Apply to abrasions right axilla) 22 g 1  . nitroGLYCERIN (NITROSTAT) 0.4 MG SL tablet Place 1 tablet (0.4 mg total) under the tongue every 5 (five) minutes as needed for chest pain. 25 tablet 2  . ticagrelor (BRILINTA) 90 MG TABS tablet Take 1 tablet (90 mg total) by mouth 2 (two) times daily. 180 tablet 10  No current facility-administered medications for this visit.     Allergies:   Patient has no known allergies.    Social History:  The patient  reports that he has been smoking cigarettes. He has been smoking about 0.10 packs per day. He has never used smokeless tobacco. He reports previous alcohol use. He reports previous drug use.   Family History:  The patient's family history includes Heart disease in his father; Hyperlipidemia in his mother.    ROS: All other systems are reviewed and negative. Unless otherwise mentioned in H&P    PHYSICAL EXAM: VS:  BP (!) 156/82   Pulse 65   Ht 5' 10.5" (1.791 m)   Wt 229 lb 12.8 oz (104.2 kg)   SpO2 100%   BMI 32.51 kg/m  ,  BMI Body mass index is 32.51 kg/m. GEN: Well nourished, well developed, in no acute distress HEENT: normal Neck: no JVD, carotid bruits, or masses Cardiac: RRR; no murmurs, rubs, or gallops,no edema  Respiratory:  Clear to auscultation bilaterally, normal work of breathing GI: soft, nontender, nondistended, + BS MS: no deformity or atrophy Skin: warm and dry, no rash Neuro:  Strength and sensation are intact Psych: euthymic mood, full affect   EKG: Not completed this office visit.  Recent Labs: 01/07/2018: ALT 45 01/08/2018: Hemoglobin 12.8; Platelets 134 01/10/2018: Magnesium 2.4 01/12/2018: BUN 14; Creatinine, Ser 1.08; Potassium 3.9; Sodium 139    Lipid Panel    Component Value Date/Time   CHOL 117 01/07/2018 1131   TRIG 140 01/07/2018 1355   HDL 24 (L) 01/07/2018 1131   CHOLHDL 4.9 01/07/2018 1131   VLDL 26 01/07/2018 1131   LDLCALC 67 01/07/2018 1131      Wt Readings from Last 3 Encounters:  03/05/18 229 lb 12.8 oz (104.2 kg)  03/03/18 226 lb 12.8 oz (102.9 kg)  01/28/18 212 lb 11.9 oz (96.5 kg)      Other studies Reviewed: Cardiac catheterization 01/03/18:   CULPRIT BIFURCATION LESION: Prox LAD lesion is 60% stenosed with 75% stenosed side branch in Ost 1st Diag. Prox LAD to Mid LAD lesion is 100% stenosed.  A drug-eluting stent was successfully placed (beginning proximal to diagonal branch) using a STENT SYNERGY DES 3X28. -Postdilated to 3.6 mm. Post intervention, there is a 0% residual stenosis in the LAD  Balloon angioplasty was performed on Ost 1st Diag using a BALLOON SAPPHIRE 2.0X12. Post intervention, the side branch was reduced to 20% residual stenosis.  ----------------------------------------------------------  Ost Ramus lesion is 65% stenosed.  ----------------------------------------------------------  There is mild left ventricular systolic dysfunction. The left ventricular ejection fraction is 45-50% by visual estimate.  LV End Diastolic  Pressure is mildly elevated pre-PCI, however post PCI down to 13 mmHg and PCWP 11 mmHg  NORMAL RIGHT HEART CATH PRESSURES  SUMMARY  Ventricular Tachycardia / Fibrillation Cardiac Arrest 2/2 AnteroLateral-Inferior STEMI  Severe 2-3 Vessel CAD - 100% mLAD, 80% 1st Diag & 70% Rmus Intermedius (RI).  Successful PCI of LAD with DES & PTCA of ost-prox 1st Diag  EF ~45% with distal Anterior-anterolateral hypokinesis.  Minimally Elevated LVEDP & PCWP with normal RHC pressures -borderline but not true cardiogenic shock  Low Normal CO/CI (with Neosynephrine off).  Acute Respiratory Failure with Acidemia  Metabolic Acidosis - treated with 2 Amp NaHCO3  RECOMMENDATIONS: Admit to CVICU -PCCM managing ventilator and possible cooling protocol  [facility] -> DC Kengreal upon arrival to CCU  Maintain low-dose Neo-Synephrine for blood pressure support due to  sedation  Defer decision making Re: Cooling protocol to PCCM. -->Anticipate right heart cath removal either later on today versus tomorrow morning.  High-dose statin, hold off on beta-blocker until we see urine toxicology screen.  Recommend uninterrupted dual antiplatelet therapy with Aspirin 81mg  daily and Ticagrelor 90mg  twice dailyfor a minimum of 12 months (ACS - Class I recommendation).  Will review images with interventional colleagues to determine if ramus intermedius lesion should be treated prior to discharge or manage medically. _____________  Echocardiogram 01/04/18:  Study Conclusions  - Left ventricle: The cavity size was mildly reduced. There was severe concentric hypertrophy. Systolic function was normal. The estimated ejection fraction was in the range of 55% to 60%. Mild hypokinesis of the apical anterior myocardium; consistent with ischemia in the distribution of the distal left anterior descending coronary artery. Doppler parameters are consistent with abnormal left  ventricular relaxation (grade 1 diastolic dysfunction). Acoustic contrast opacification revealed no evidence ofthrombus.  ASSESSMENT AND PLAN:  1. CAD: Hx of cardiac arrest with emergent PCI of the mid LAD, PTCA of  1st diagonal, and 70% ramus. He has done well without recurrent chest pain or palpitations. He is medically compliant. He is anxious to return to work.   He is released to go back to work without restrictions. A letter for his employer is provided. He is to continue medical regimen of DAPT, and carvedilol, with ARB. He will be seen in 6 months unless symptomatic.   2. Hypercholesterolemia: Fasting lipids and LFT's will be completed today along with BMET and CBC for medical management.  3. Polysubstance abuse: He has abstained from ETOH, illicit drugs and is weaning from cigarettes. I have encouraged this lifestyle change and completed cessation of tobacco use.   4. Hypertension: I have rechecked his BP in the exam room as it was elevated on check in. Now 148/80. He is to keep up with this at home. Report elevations or consistently elevated BP>150/90.    Current medicines are reviewed at length with the patient today.    Labs/ tests ordered today include: Lipids and LFT's. BMET and CBC.   Rodney MareKathryn M. Liborio NixonLawrence DNP, ANP, AACC   03/05/2018 9:16 AM    [facility] [address] Office [phone] Fax [phone]

## 2018-03-03 ENCOUNTER — Ambulatory Visit (INDEPENDENT_AMBULATORY_CARE_PROVIDER_SITE_OTHER): Payer: Self-pay | Admitting: Family Medicine

## 2018-03-03 ENCOUNTER — Encounter: Payer: Self-pay | Admitting: Family Medicine

## 2018-03-03 VITALS — BP 135/95 | HR 68 | Resp 17 | Ht 70.0 in | Wt 226.8 lb

## 2018-03-03 DIAGNOSIS — I10 Essential (primary) hypertension: Secondary | ICD-10-CM

## 2018-03-03 DIAGNOSIS — Z720 Tobacco use: Secondary | ICD-10-CM

## 2018-03-03 MED ORDER — TICAGRELOR 90 MG PO TABS: 90.0000 mg | ORAL_TABLET | Freq: Two times a day (BID) | ORAL | 10 refills | Status: DC

## 2018-03-03 MED ORDER — CARVEDILOL 12.5 MG PO TABS: 12.5000 mg | ORAL_TABLET | Freq: Two times a day (BID) | ORAL | 4 refills | Status: DC

## 2018-03-03 MED ORDER — NITROGLYCERIN 0.4 MG SL SUBL: 0.4000 mg | SUBLINGUAL_TABLET | SUBLINGUAL | 2 refills | Status: DC | PRN

## 2018-03-03 MED ORDER — ATORVASTATIN CALCIUM 40 MG PO TABS: 40.0000 mg | ORAL_TABLET | Freq: Every day | ORAL | 4 refills | Status: DC

## 2018-03-03 MED ORDER — ASPIRIN 81 MG PO CHEW: 81.0000 mg | CHEWABLE_TABLET | Freq: Every day | ORAL | Status: DC

## 2018-03-03 MED ORDER — LOSARTAN POTASSIUM 25 MG PO TABS: 25.0000 mg | ORAL_TABLET | Freq: Every day | ORAL | 4 refills | Status: DC

## 2018-03-03 MED FILL — CARVEDILOL 12.5 MG TABLET: 12.5 | 30 days supply | Qty: 60 | Fill #0

## 2018-03-03 MED FILL — LOSARTAN POTASSIUM 25 MG TA: 25 | 30 days supply | Qty: 30 | Fill #0

## 2018-03-03 MED FILL — NITROGLYCERIN 0.4 MG TAB SL: 0.4 | 25 days supply | Qty: 25 | Fill #0

## 2018-03-03 MED FILL — ATORVASTATIN CALCIUM 40 MG: 40 | 30 days supply | Qty: 30 | Fill #0

## 2018-03-03 MED FILL — BRILINTA 90 MG TABS: 90 | 30 days supply | Qty: 60 | Fill #0

## 2018-03-03 NOTE — Progress Notes (Signed)
Established Patient Office Visit  Subjective:  Patient ID: Rodney Lloyd Danese, male    DOB: 03/06/1970  Age: 47 y.o. MRN: [id number]  CC:  Chief Complaint  Patient presents with  . Hypertension  . Nicotine Dependence    f/up on smoking cessation    HPI Rodney Lloyd Plaugher presents for hypertension and smoking cessation follow-up. Patient is status post Myocardial infarction (01/03/2018). Smoking cessation is continuing to be a work in process. He was prescribed nicotine patches although stopped taking as he continued to have craving to smoke. He has decreased number of cigarettes smoked daily from a pack per day to a pack lasting up to 4 days. Blood pressure is well controlled today. He was checking readings at home stopped due to inaccurate readings due to malfunction of blood pressure cuff. Home health nurse was frequently checking BP while performing home visits and BP remained stable. He has been negative of edema, shortness of breath, chest pain, headaches. Occasional experiences a flutter in his chest which he describes as non painful. Taking medication as prescribed. He is ready to return to work and has a follow-up scheduled with cardiology on 03/05/2018 and hopes to be cleared to return to work.   Past Medical History:  Diagnosis Date  . Alcohol abuse   . CAD (coronary artery disease), native coronary artery    s/p PCI to LAD 01/2018  . Cocaine abuse (HCC)   . HLD (hyperlipidemia)   . Hypertension   . STEMI (ST elevation myocardial infarction) (HCC)   . Tobacco abuse     Past Surgical History:  Procedure Laterality Date  . CORONARY/GRAFT ACUTE MI REVASCULARIZATION N/A 01/03/2018   Procedure: Coronary/Graft Acute MI Revascularization;  Surgeon: Marykay LexHarding, David W, MD;  [facility];  Service: Cardiovascular;  Laterality: N/A;  . RIGHT/LEFT HEART CATH AND CORONARY ANGIOGRAPHY N/A 01/03/2018   Procedure: RIGHT/LEFT HEART CATH AND CORONARY ANGIOGRAPHY;  Surgeon: Marykay LexHarding, David W,  MD;  [facility];  Service: Cardiovascular;  Laterality: N/A;    Family History  Problem Relation Age of Onset  . Hyperlipidemia Mother   . Heart disease Father     Social History   Socioeconomic History  . Marital status: Divorced    Spouse name: Not on file  . Number of children: Not on file  . Years of education: Not on file  . Highest education level: Not on file  Occupational History  . Not on file  Social Needs  . Financial resource strain: Not on file  . Food insecurity:    Worry: Not on file    Inability: Not on file  . Transportation needs:    Medical: Not on file    Non-medical: Not on file  Tobacco Use  . Smoking status: Current Every Day Smoker    Packs/day: 0.10    Types: Cigarettes  . Smokeless tobacco: Never Used  Substance and Sexual Activity  . Alcohol use: Not Currently  . Drug use: Not Currently  . Sexual activity: Not on file  Lifestyle  . Physical activity:    Days per week: Not on file    Minutes per session: Not on file  . Stress: Not on file  Relationships  . Social connections:    Talks on phone: Not on file    Gets together: Not on file    Attends religious service: Not on file    Active member of club or organization: Not on file    Attends  meetings of clubs or organizations: Not on file    Relationship status: Not on file  . Intimate partner violence:    Fear of current or ex partner: Not on file    Emotionally abused: Not on file    Physically abused: Not on file    Forced sexual activity: Not on file  Other Topics Concern  . Not on file  Social History Narrative  . Not on file    Outpatient Medications Prior to Visit  Medication Sig Dispense Refill  . acetaminophen (TYLENOL) 325 MG tablet Take 2 tablets (650 mg total) by mouth every 4 (four) hours as needed for headache or mild pain.    Marland Kitchen. aspirin 81 MG chewable tablet Chew 1 tablet (81 mg total) by mouth daily.    Marland Kitchen. atorvastatin (LIPITOR) 40 MG tablet Take  1 tablet (40 mg total) by mouth daily. 90 tablet 4  . carvedilol (COREG) 12.5 MG tablet Take 1 tablet (12.5 mg total) by mouth 2 (two) times daily with a meal. 180 tablet 4  . losartan (COZAAR) 25 MG tablet Take 1 tablet (25 mg total) by mouth daily. 90 tablet 4  . mupirocin ointment (BACTROBAN) 2 % Apply 1 application topically 3 (three) times daily. Apply to abrasions right axilla (Patient taking differently: Apply 1 application topically 3 (three) times daily as needed. Apply to abrasions right axilla) 22 g 1  . nitroGLYCERIN (NITROSTAT) 0.4 MG SL tablet Place 1 tablet (0.4 mg total) under the tongue every 5 (five) minutes as needed for chest pain. 25 tablet 2  . ticagrelor (BRILINTA) 90 MG TABS tablet Take 1 tablet (90 mg total) by mouth 2 (two) times daily. 180 tablet 10  . nicotine (NICODERM CQ - DOSED IN MG/24 HOURS) 14 mg/24hr patch Place 1 patch (14 mg total) onto the skin daily. 28 patch 1   No facility-administered medications prior to visit.     No Known Allergies  ROS Review of Systems Pertinent negatives listed in HPI   Objective:    Physical Exam BP (!) 135/95   Pulse 68   Resp 17   Ht 5\' 10"  (1.778 m)   Wt 226 lb 12.8 oz (102.9 kg)   SpO2 96%   BMI 32.54 kg/m  Wt Readings from Last 3 Encounters:  03/03/18 226 lb 12.8 oz (102.9 kg)  01/28/18 212 lb 11.9 oz (96.5 kg)  01/26/18 211 lb 12.8 oz (96.1 kg)     Health Maintenance Due  Topic Date Due  . HIV Screening  04/06/1985  . TETANUS/TDAP  04/06/1989    There are no preventive care reminders to display for this patient.  No results found for: TSH Lab Results  Component Value Date   WBC 7.2 01/08/2018   HGB 12.8 (L) 01/08/2018   HCT 39.8 01/08/2018   MCV 89.0 01/08/2018   PLT 134 (L) 01/08/2018   Lab Results  Component Value Date   NA 139 01/12/2018   K 3.9 01/12/2018   CO2 25 01/12/2018   GLUCOSE 97 01/12/2018   BUN 14 01/12/2018   CREATININE 1.08 01/12/2018   BILITOT 1.0 01/07/2018   ALKPHOS  92 01/07/2018   AST 54 (H) 01/07/2018   ALT 45 (H) 01/07/2018   PROT 5.5 (L) 01/07/2018   ALBUMIN 2.7 (L) 01/10/2018   CALCIUM 8.9 01/12/2018   ANIONGAP 6 01/12/2018   Lab Results  Component Value Date   CHOL 117 01/07/2018   Lab Results  Component Value Date   HDL  24 (L) 01/07/2018   Lab Results  Component Value Date   LDLCALC 67 01/07/2018   Lab Results  Component Value Date   TRIG 140 01/07/2018   Lab Results  Component Value Date   CHOLHDL 4.9 01/07/2018   Lab Results  Component Value Date   HGBA1C 5.7 (H) 01/06/2018      Assessment & Plan:  1. Essential hypertension, well controlled We have discussed target BP range and blood pressure goal. I have advised patient to check BP regularly and to call us back or report to clinic if the numbers are consistently higher than 140/90. We discussed the importance of compliance with medical therapy and DASH diet recommended, consequences of uncontrolled hypertension discussed.  - Continue current BP medications  2. Tobacco abuse -Continue efforts to cut back and achieve cessation   Meds ordered this encounter  Medications  . ticagrelor (BRILINTA) 90 MG TABS tablet    Sig: Take 1 tablet (90 mg total) by mouth 2 (two) times daily.    Dispense:  180 tablet    Refill:  10    Need medication assistance. Has financial packet  . losartan (COZAAR) 25 MG tablet    Sig: Take 1 tablet (25 mg total) by mouth daily.    Dispense:  90 tablet    Refill:  4  . carvedilol (COREG) 12.5 MG tablet    Sig: Take 1 tablet (12.5 mg total) by mouth 2 (two) times daily with a meal.    Dispense:  180 tablet    Refill:  4  . atorvastatin (LIPITOR) 40 MG tablet    Sig: Take 1 tablet (40 mg total) by mouth daily.    Dispense:  90 tablet    Refill:  4  . aspirin 81 MG chewable tablet    Sig: Chew 1 tablet (81 mg total) by mouth daily.  . nitroGLYCERIN (NITROSTAT) 0.4 MG SL tablet    Sig: Place 1 tablet (0.4 mg total) under the tongue every 5  (five) minutes as needed for chest pain.    Dispense:  25 tablet    Refill:  2    Follow-up: Return in about 6 months (around 09/01/2018).    Joaquin Courts, FNP

## 2018-03-03 NOTE — Patient Instructions (Signed)
Steps to Quit Smoking  Smoking tobacco can be bad for your health. It can also affect almost every organ in your body. Smoking puts you and people around you at risk for many serious long-lasting (chronic) diseases. Quitting smoking is hard, but it is one of the best things that you can do for your health. It is never too late to quit. What are the benefits of quitting smoking? When you quit smoking, you lower your risk for getting serious diseases and conditions. They can include:  Lung cancer or lung disease.  Heart disease.  Stroke.  Heart attack.  Not being able to have children (infertility).  Weak bones (osteoporosis) and broken bones (fractures). If you have coughing, wheezing, and shortness of breath, those symptoms may get better when you quit. You may also get sick less often. If you are pregnant, quitting smoking can help to lower your chances of having a baby of low birth weight. What can I do to help me quit smoking? Talk with your doctor about what can help you quit smoking. Some things you can do (strategies) include:  Quitting smoking totally, instead of slowly cutting back how much you smoke over a period of time.  Going to in-person counseling. You are more likely to quit if you go to many counseling sessions.  Using resources and support systems, such as: ? Online chats with a counselor. ? Phone quitlines. ? Printed self-help materials. ? Support groups or group counseling. ? Text messaging programs. ? Mobile phone apps or applications.  Taking medicines. Some of these medicines may have nicotine in them. If you are pregnant or breastfeeding, do not take any medicines to quit smoking unless your doctor says it is okay. Talk with your doctor about counseling or other things that can help you. Talk with your doctor about using more than one strategy at the same time, such as taking medicines while you are also going to in-person counseling. This can help make  quitting easier. What things can I do to make it easier to quit? Quitting smoking might feel very hard at first, but there is a lot that you can do to make it easier. Take these steps:  Talk to your family and friends. Ask them to support and encourage you.  Call phone quitlines, reach out to support groups, or work with a counselor.  Ask people who smoke to not smoke around you.  Avoid places that make you want (trigger) to smoke, such as: ? Bars. ? Parties. ? Smoke-break areas at work.  Spend time with people who do not smoke.  Lower the stress in your life. Stress can make you want to smoke. Try these things to help your stress: ? Getting regular exercise. ? Deep-breathing exercises. ? Yoga. ? Meditating. ? Doing a body scan. To do this, close your eyes, focus on one area of your body at a time from head to toe, and notice which parts of your body are tense. Try to relax the muscles in those areas.  Download or buy apps on your mobile phone or tablet that can help you stick to your quit plan. There are many free apps, such as QuitGuide from the CDC (Centers for Disease Control and Prevention). You can find more support from smokefree.gov and other websites. This information is not intended to replace advice given to you by your health care provider. Make sure you discuss any questions you have with your health care provider. Document Released: 12/15/2008 Document Revised: 10/17/2015   Document Reviewed: 07/05/2014 Elsevier Interactive Patient Education  2019 Elsevier Inc.  DASH Eating Plan DASH stands for "Dietary Approaches to Stop Hypertension." The DASH eating plan is a healthy eating plan that has been shown to reduce high blood pressure (hypertension). It may also reduce your risk for type 2 diabetes, heart disease, and stroke. The DASH eating plan may also help with weight loss. What are tips for following this plan?  General guidelines  Avoid eating more than 2,300 mg  (milligrams) of salt (sodium) a day. If you have hypertension, you may need to reduce your sodium intake to 1,500 mg a day.  Limit alcohol intake to no more than 1 drink a day for nonpregnant women and 2 drinks a day for men. One drink equals 12 oz of beer, 5 oz of wine, or 1 oz of hard liquor.  Work with your health care provider to maintain a healthy body weight or to lose weight. Ask what an ideal weight is for you.  Get at least 30 minutes of exercise that causes your heart to beat faster (aerobic exercise) most days of the week. Activities may include walking, swimming, or biking.  Work with your health care provider or diet and nutrition specialist (dietitian) to adjust your eating plan to your individual calorie needs. Reading food labels   Check food labels for the amount of sodium per serving. Choose foods with less than 5 percent of the Daily Value of sodium. Generally, foods with less than 300 mg of sodium per serving fit into this eating plan.  To find whole grains, look for the word "whole" as the first word in the ingredient list. Shopping  Buy products labeled as "low-sodium" or "no salt added."  Buy fresh foods. Avoid canned foods and premade or frozen meals. Cooking  Avoid adding salt when cooking. Use salt-free seasonings or herbs instead of table salt or sea salt. Check with your health care provider or pharmacist before using salt substitutes.  Do not fry foods. Cook foods using healthy methods such as baking, boiling, grilling, and broiling instead.  Cook with heart-healthy oils, such as olive, canola, soybean, or sunflower oil. Meal planning  Eat a balanced diet that includes: ? 5 or more servings of fruits and vegetables each day. At each meal, try to fill half of your plate with fruits and vegetables. ? Up to 6-8 servings of whole grains each day. ? Less than 6 oz of lean meat, poultry, or fish each day. A 3-oz serving of meat is about the same size as a deck  of cards. One egg equals 1 oz. ? 2 servings of low-fat dairy each day. ? A serving of nuts, seeds, or beans 5 times each week. ? Heart-healthy fats. Healthy fats called Omega-3 fatty acids are found in foods such as flaxseeds and coldwater fish, like sardines, salmon, and mackerel.  Limit how much you eat of the following: ? Canned or prepackaged foods. ? Food that is high in trans fat, such as fried foods. ? Food that is high in saturated fat, such as fatty meat. ? Sweets, desserts, sugary drinks, and other foods with added sugar. ? Full-fat dairy products.  Do not salt foods before eating.  Try to eat at least 2 vegetarian meals each week.  Eat more home-cooked food and less restaurant, buffet, and fast food.  When eating at a restaurant, ask that your food be prepared with less salt or no salt, if possible. What foods are recommended? The   items listed may not be a complete list. Talk with your dietitian about what dietary choices are best for you. Grains Whole-grain or whole-wheat bread. Whole-grain or whole-wheat pasta. Brown rice. Oatmeal. Quinoa. Bulgur. Whole-grain and low-sodium cereals. Pita bread. Low-fat, low-sodium crackers. Whole-wheat flour tortillas. Vegetables Fresh or frozen vegetables (raw, steamed, roasted, or grilled). Low-sodium or reduced-sodium tomato and vegetable juice. Low-sodium or reduced-sodium tomato sauce and tomato paste. Low-sodium or reduced-sodium canned vegetables. Fruits All fresh, dried, or frozen fruit. Canned fruit in natural juice (without added sugar). Meat and other protein foods Skinless chicken or turkey. Ground chicken or turkey. Pork with fat trimmed off. Fish and seafood. Egg whites. Dried beans, peas, or lentils. Unsalted nuts, nut butters, and seeds. Unsalted canned beans. Lean cuts of beef with fat trimmed off. Low-sodium, lean deli meat. Dairy Low-fat (1%) or fat-free (skim) milk. Fat-free, low-fat, or reduced-fat cheeses. Nonfat,  low-sodium ricotta or cottage cheese. Low-fat or nonfat yogurt. Low-fat, low-sodium cheese. Fats and oils Soft margarine without trans fats. Vegetable oil. Low-fat, reduced-fat, or light mayonnaise and salad dressings (reduced-sodium). Canola, safflower, olive, soybean, and sunflower oils. Avocado. Seasoning and other foods Herbs. Spices. Seasoning mixes without salt. Unsalted popcorn and pretzels. Fat-free sweets. What foods are not recommended? The items listed may not be a complete list. Talk with your dietitian about what dietary choices are best for you. Grains Baked goods made with fat, such as croissants, muffins, or some breads. Dry pasta or rice meal packs. Vegetables Creamed or fried vegetables. Vegetables in a cheese sauce. Regular canned vegetables (not low-sodium or reduced-sodium). Regular canned tomato sauce and paste (not low-sodium or reduced-sodium). Regular tomato and vegetable juice (not low-sodium or reduced-sodium). Pickles. Olives. Fruits Canned fruit in a light or heavy syrup. Fried fruit. Fruit in cream or butter sauce. Meat and other protein foods Fatty cuts of meat. Ribs. Fried meat. Bacon. Sausage. Bologna and other processed lunch meats. Salami. Fatback. Hotdogs. Bratwurst. Salted nuts and seeds. Canned beans with added salt. Canned or smoked fish. Whole eggs or egg yolks. Chicken or turkey with skin. Dairy Whole or 2% milk, cream, and half-and-half. Whole or full-fat cream cheese. Whole-fat or sweetened yogurt. Full-fat cheese. Nondairy creamers. Whipped toppings. Processed cheese and cheese spreads. Fats and oils Butter. Stick margarine. Lard. Shortening. Ghee. Bacon fat. Tropical oils, such as coconut, palm kernel, or palm oil. Seasoning and other foods Salted popcorn and pretzels. Onion salt, garlic salt, seasoned salt, table salt, and sea salt. Worcestershire sauce. Tartar sauce. Barbecue sauce. Teriyaki sauce. Soy sauce, including reduced-sodium. Steak sauce.  Canned and packaged gravies. Fish sauce. Oyster sauce. Cocktail sauce. Horseradish that you find on the shelf. Ketchup. Mustard. Meat flavorings and tenderizers. Bouillon cubes. Hot sauce and Tabasco sauce. Premade or packaged marinades. Premade or packaged taco seasonings. Relishes. Regular salad dressings. Where to find more information:  National Heart, Lung, and Blood Institute: www.nhlbi.nih.gov  American Heart Association: www.heart.org Summary  The DASH eating plan is a healthy eating plan that has been shown to reduce high blood pressure (hypertension). It may also reduce your risk for type 2 diabetes, heart disease, and stroke.  With the DASH eating plan, you should limit salt (sodium) intake to 2,300 mg a day. If you have hypertension, you may need to reduce your sodium intake to 1,500 mg a day.  When on the DASH eating plan, aim to eat more fresh fruits and vegetables, whole grains, lean proteins, low-fat dairy, and heart-healthy fats.  Work with your health care provider   or diet and nutrition specialist (dietitian) to adjust your eating plan to your individual calorie needs. This information is not intended to replace advice given to you by your health care provider. Make sure you discuss any questions you have with your health care provider. Document Released: 02/07/2011 Document Revised: 02/12/2016 Document Reviewed: 02/12/2016 Elsevier Interactive Patient Education  2019 Elsevier Inc.  

## 2018-03-05 ENCOUNTER — Ambulatory Visit (INDEPENDENT_AMBULATORY_CARE_PROVIDER_SITE_OTHER): Payer: Self-pay | Admitting: Adult Health

## 2018-03-05 ENCOUNTER — Encounter: Payer: Self-pay | Admitting: Adult Health

## 2018-03-05 VITALS — BP 156/82 | HR 65 | Ht 70.5 in | Wt 229.8 lb

## 2018-03-05 DIAGNOSIS — E7800 Pure hypercholesterolemia, unspecified: Secondary | ICD-10-CM

## 2018-03-05 DIAGNOSIS — I10 Essential (primary) hypertension: Secondary | ICD-10-CM

## 2018-03-05 DIAGNOSIS — F1910 Other psychoactive substance abuse, uncomplicated: Secondary | ICD-10-CM

## 2018-03-05 DIAGNOSIS — I2510 Atherosclerotic heart disease of native coronary artery without angina pectoris: Secondary | ICD-10-CM

## 2018-03-05 DIAGNOSIS — Z79899 Other long term (current) drug therapy: Secondary | ICD-10-CM

## 2018-03-05 LAB — LIPID PANEL
CHOLESTEROL TOTAL: 124 mg/dL (ref 100–199)
Chol/HDL Ratio: 2.4 ratio (ref 0.0–5.0)
HDL: 51 mg/dL (ref >39)
LDL Calculated: 64 mg/dL (ref 0–99)
TRIGLYCERIDES: 47 mg/dL (ref 0–149)
VLDL Cholesterol Cal: 9 mg/dL (ref 5–40)

## 2018-03-05 LAB — HEPATIC FUNCTION PANEL
ALK PHOS: 75 IU/L (ref 39–117)
ALT: 26 IU/L (ref 0–44)
AST: 18 IU/L (ref 0–40)
Albumin: 4.4 g/dL (ref 3.5–5.5)
BILIRUBIN TOTAL: 0.4 mg/dL (ref 0.0–1.2)
BILIRUBIN, DIRECT: 0.13 mg/dL (ref 0.00–0.40)
Total Protein: 7.1 g/dL (ref 6.0–8.5)

## 2018-03-05 LAB — CBC
Hematocrit: 39.3 % (ref 37.5–51.0)
Hemoglobin: 13.1 g/dL (ref 13.0–17.7)
MCH: 29.4 pg (ref 26.6–33.0)
MCHC: 33.3 g/dL (ref 31.5–35.7)
MCV: 88 fL (ref 79–97)
PLATELETS: 221 10*3/uL (ref 150–450)
RBC: 4.46 x10E6/uL (ref 4.14–5.80)
RDW: 12.9 % (ref 12.3–15.4)
WBC: 6.3 10*3/uL (ref 3.4–10.8)

## 2018-03-05 NOTE — Patient Instructions (Signed)
Follow-Up: You will need a follow up appointment in 6 months.  Please call our office 2 months (FEB 2020) in advance to schedule this appointment (July 2020).  You may see Bryan Lemmaavid Harding, MD Joni ReiningKathryn Lawrence, DNP, AACC or one of the following Advanced Practice Providers on your designated Care Team:  Theodore DemarkRhonda Barrett, PA-C  Joni ReiningKathryn Lawrence, DNP, ANP    Medication Instructions:  NO CHANGES- Your physician recommends that you continue on your current medications as directed. Please refer to the Current Medication list given to you today. If you need a refill on your cardiac medications before your next appointment, please call your pharmacy.  Labwork: When you have labs (blood work) and your tests are completely normal, you will receive your results ONLY by MyChart Message (if you have MyChart) -OR- A paper copy in the mail.  [facility], you and your health needs are our priority.  As part of our continuing mission to provide you with exceptional heart care, we have created designated Provider Care Teams.  These Care Teams include your primary Cardiologist (physician) and Advanced Practice Providers (APPs -  Physician Assistants and Nurse Practitioners) who all work together to provide you with the care you need, when you need it.  Thank you for choosing [facility]!!

## 2018-03-09 ENCOUNTER — Other Ambulatory Visit: Payer: Self-pay

## 2018-03-09 DIAGNOSIS — Z79899 Other long term (current) drug therapy: Secondary | ICD-10-CM

## 2018-03-09 DIAGNOSIS — E7800 Pure hypercholesterolemia, unspecified: Secondary | ICD-10-CM

## 2018-03-09 NOTE — Progress Notes (Signed)
Notes recorded by Jodelle Gross, NP on 03/07/2018 at 6:57 PM EST Significant improvement in his cholesterol status. LDL is almost to goal. Continue current regimen and diet restrictions. Check again in 3 months.

## 2018-03-10 ENCOUNTER — Telehealth: Payer: Self-pay | Admitting: Family Medicine

## 2018-03-10 NOTE — Telephone Encounter (Signed)
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

## 2018-03-11 ENCOUNTER — Telehealth: Payer: Self-pay | Admitting: Family Medicine

## 2018-03-11 NOTE — Telephone Encounter (Signed)
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 Do you have any resources?

## 2018-03-11 NOTE — Telephone Encounter (Signed)
Patient needs appt with Jasmine.

## 2018-03-11 NOTE — Telephone Encounter (Signed)
Contact patient to schedule a visit with our social worker as he is uninsured and will need resources for substance abuse services. He would definitely benefit from meeting with [facility].

## 2018-03-17 ENCOUNTER — Ambulatory Visit (INDEPENDENT_AMBULATORY_CARE_PROVIDER_SITE_OTHER): Payer: Self-pay | Admitting: Licensed Clinical Social Worker

## 2018-03-17 DIAGNOSIS — F419 Anxiety disorder, unspecified: Secondary | ICD-10-CM

## 2018-03-18 NOTE — BH Specialist Note (Signed)
Integrated Behavioral Health Initial Visit  MRN: [id number] Name: Rodney Lloyd  Number of Integrated Behavioral Health Clinician visits:: 1/6 Session Start time: 3:30 PM  Session End time: 3:50 PM Total time: 20 minutes  Type of Service: Integrated Behavioral Health- Individual/Family Interpretor:No. Interpretor Name and Language: N/A   SUBJECTIVE: Rodney Lloyd is a 48 y.o. male accompanied by Partner/Significant Other Patient was referred by FNP Tiburcio Pea for substance use treatment. Patient reports the following symptoms/concerns: Pt had a heart attack November 2019. Substances were found in his system and pt is required to receive 30 day treatment before having clearance to return to work Duration of problem: three months; Severity of problem: moderate  OBJECTIVE: Mood: Anxious and Affect: Appropriate Risk of harm to self or others: No plan to harm self or others  LIFE CONTEXT: Family and Social: Pt receives strong support from partner School/Work: Pt is employed as a [employer] architect. He is awaiting paperwork to receive CAFA and orange card Self-Care: Pt has a hx of smoking marijuana, denies use of additional substances Life Changes: Pt had a heart attack November 2019. Substances were found in his system and pt is required to receive 30 day treatment before having clearance to return to work   GOALS ADDRESSED: Patient will: 1. Reduce symptoms of: anxiety and stress 2. Increase knowledge and/or ability of: coping skills and healthy habits  3. Demonstrate ability to: Increase healthy adjustment to current life circumstances and Increase adequate support systems for patient/family  INTERVENTIONS: Interventions utilized: Solution-Focused Strategies, Supportive Counseling, Psychoeducation and/or Health Education and Link to [facility]  Standardized Assessments completed: Not Needed  ASSESSMENT: Patient currently experiencing anxiety. Pt had a heart attack November 2019.  Substances were found in his system and pt is required to receive 30 day treatment before having clearance to return to work. He receives strong support from partner, who accompanied pt during visit.   Patient may benefit from psychoeducation and linkage to community resources. LCSWA educated pt on the correlation between one's physical and mental health, in addition, to how substance use can negatively impact both. Therapeutic interventions were discussed and substance use treatment resources were provided.  PLAN: 1. Follow up with behavioral health clinician on : Pt was encouraged to contact LCSWA if symptoms worsen or fail to improve to schedule behavioral appointments at [facility]. 2. Behavioral recommendations: LCSWA recommends that pt apply healthy coping skills discussed, initiate substance use treatment, and utilize provided resources. Pt is encouraged to schedule follow up appointment with LCSWA 3. Referral(s): Integrated Art gallery manager (In Clinic) and Substance Abuse Program 4. "From scale of 1-10, how likely are you to follow plan?":   Bridgett Larsson, LCSW 03/20/2018 3:44 PM

## 2018-04-10 ENCOUNTER — Ambulatory Visit: Payer: Self-pay | Attending: Family Medicine

## 2018-04-10 MED FILL — LOSARTAN POTASSIUM 25 MG TA: 25 | 30 days supply | Qty: 30 | Fill #1

## 2018-04-10 MED FILL — ?CARVEDILOL 12.5 MG TABLET: 12.5 | 30 days supply | Qty: 60 | Fill #1

## 2018-04-10 MED FILL — ?ATORVASTATIN 40MG TABLET: 40 | 30 days supply | Qty: 30 | Fill #1

## 2018-04-10 MED FILL — BRILINTA 90 MG TABLET: 90 | 30 days supply | Qty: 60 | Fill #1

## 2018-04-21 ENCOUNTER — Encounter: Payer: Self-pay | Admitting: Cardiology

## 2018-04-21 ENCOUNTER — Other Ambulatory Visit: Payer: Self-pay | Admitting: *Deleted

## 2018-04-21 ENCOUNTER — Telehealth: Payer: Self-pay | Admitting: Adult Health

## 2018-04-21 ENCOUNTER — Encounter (INDEPENDENT_AMBULATORY_CARE_PROVIDER_SITE_OTHER): Payer: Self-pay

## 2018-04-21 ENCOUNTER — Ambulatory Visit (INDEPENDENT_AMBULATORY_CARE_PROVIDER_SITE_OTHER): Payer: Self-pay | Admitting: Cardiology

## 2018-04-21 VITALS — BP 148/96 | HR 73 | Ht 70.5 in | Wt 250.8 lb

## 2018-04-21 DIAGNOSIS — I2510 Atherosclerotic heart disease of native coronary artery without angina pectoris: Secondary | ICD-10-CM

## 2018-04-21 DIAGNOSIS — M79604 Pain in right leg: Secondary | ICD-10-CM

## 2018-04-21 DIAGNOSIS — I10 Essential (primary) hypertension: Secondary | ICD-10-CM

## 2018-04-21 DIAGNOSIS — R002 Palpitations: Secondary | ICD-10-CM

## 2018-04-21 MED ORDER — LOSARTAN POTASSIUM 50 MG PO TABS: 50.0000 mg | ORAL_TABLET | Freq: Every day | ORAL | 3 refills | Status: DC

## 2018-04-21 NOTE — Patient Instructions (Addendum)
Medication Instructions:  INCREASE: Losartan to 50 mg daily   If you need a refill on your cardiac medications before your next appointment, please call your pharmacy.   Lab work: TODAY: TSH, BMET & MAG  If you have labs (blood work) drawn today and your tests are completely normal, you will receive your results only by: Marland Kitchen MyChart Message (if you have MyChart) OR . A paper copy in the mail If you have any lab test that is abnormal or we need to change your treatment, we will call you to review the results.  Testing/Procedures: Your physician has requested that you have a lower or upper extremity venous duplex TO BE DONE TOMORROW IF POSSIBLE. This test is an ultrasound of the veins in the legs or arms. It looks at venous blood flow that carries blood from the heart to the legs or arms. Allow one hour for a Lower Venous exam. Allow thirty minutes for an Upper Venous exam. There are no restrictions or special instructions.  You physician has ordered for you to wear a long term monitor for 14 days     Follow-Up: Follow up with Joni Reining, DNP on 04/29/2018 @ 8:30 AM   Any Other Special Instructions Will Be Listed Below (If Applicable).\  Make sure you are keeping a watch on your salt intake

## 2018-04-21 NOTE — Telephone Encounter (Signed)
New Message          Patient is calling today about a funny feeling in chest along with cramps/tightness/numbness/burning sensation. Patient also has a funny feeling in his chest would like to come in sooner. Pls call and advise

## 2018-04-21 NOTE — Progress Notes (Signed)
Cardiology Office Note   Date:  04/22/2018   ID:  Rodney Lloyd, DOB 03/16/1970, MRN [id number]  PCP:  Bing Neighbors, FNP  Cardiologist:  Dr. Herbie Baltimore     Chief Complaint  Patient presents with  . Leg Pain      History of Present Illness: Rodney Lloyd is a 48 y.o. male who presents for chest discomfort and leg swelling.   CAD, after admission for STEMI after a witness arrest at a hotel on 01/03/2018. CPR was started, EMS documented VT.Emergent cath.   He was found to have severe 2 vessel CAD with 100% mLAD, 80% 1st diagonal, and 70% ramus intermedius. He had successful PCI of the LAD with DES and PTCA of the ostial-proximal 1st diagonal. EF 45%.   He was recommended for uninterrupted DAPT with ASA and Brilinta for a minimum of 12 months.  He was found to be positive for cocaine and marijuana on UDS and therefore not started on BB.   Seen in the office on 01/26/2018 for TOC follow up. He was sore but doing well. He had subsequently been started on coreg and losartan. He was to be out of work until today's follow up.   Last OV 03/10/18 visit He was feeling much better, is anxious to go back to work as a [employer]. He denies chest pain, is eating more healthy and is down to 5 cigarettes a day, from over a pack a day. He has not used any illicit drugs or drank any ETOH.   Today he did try and return to work but could not tolerate it now.  He would like to hold off until his leg pain improved.  Mostly Rt knee pain  All around knee and into calf.  Also some discomfort from groin to knee but mostly the knee.  He is worried about a blood clot.   He has some fluttering in his chest, no pain.  He is anxious after his episode in Nov.  He continues to smoke though continues to decrease.   Has not missed ASA or brilinta.   His BP is elevated today.       Past Medical History:  Diagnosis Date  . Alcohol abuse   . CAD (coronary artery disease), native coronary artery    s/p PCI to  LAD 01/2018  . Cocaine abuse (HCC)   . HLD (hyperlipidemia)   . Hypertension   . STEMI (ST elevation myocardial infarction) (HCC)   . Tobacco abuse     Past Surgical History:  Procedure Laterality Date  . CORONARY/GRAFT ACUTE MI REVASCULARIZATION N/A 01/03/2018   Procedure: Coronary/Graft Acute MI Revascularization;  Surgeon: Marykay Lex, MD;  Location: [facility] INVASIVE CV LAB;  Service: Cardiovascular;  Laterality: N/A;  . RIGHT/LEFT HEART CATH AND CORONARY ANGIOGRAPHY N/A 01/03/2018   Procedure: RIGHT/LEFT HEART CATH AND CORONARY ANGIOGRAPHY;  Surgeon: Marykay Lex, MD;  Location: [facility] INVASIVE CV LAB;  Service: Cardiovascular;  Laterality: N/A;     Current Outpatient Medications  Medication Sig Dispense Refill  . acetaminophen (TYLENOL) 325 MG tablet Take 2 tablets (650 mg total) by mouth every 4 (four) hours as needed for headache or mild pain.    Marland Kitchen aspirin 81 MG chewable tablet Chew 1 tablet (81 mg total) by mouth daily.    Marland Kitchen atorvastatin (LIPITOR) 40 MG tablet Take 1 tablet (40 mg total) by mouth daily. 90 tablet 4  . carvedilol (COREG) 12.5 MG tablet Take 1 tablet (12.5 mg  total) by mouth 2 (two) times daily with a meal. 180 tablet 4  . nicotine (NICODERM CQ - DOSED IN MG/24 HOURS) 14 mg/24hr patch Place 1 patch onto the skin daily.    . nitroGLYCERIN (NITROSTAT) 0.4 MG SL tablet Place 1 tablet (0.4 mg total) under the tongue every 5 (five) minutes as needed for chest pain. 25 tablet 2  . ticagrelor (BRILINTA) 90 MG TABS tablet Take 1 tablet (90 mg total) by mouth 2 (two) times daily. 180 tablet 10  . losartan (COZAAR) 50 MG tablet Take 1 tablet (50 mg total) by mouth daily. 90 tablet 3   No current facility-administered medications for this visit.     Allergies:   Patient has no known allergies.    Social History:  The patient  reports that he has been smoking cigarettes. He has been smoking about 0.10 packs per day. He has never used smokeless tobacco. He reports previous  alcohol use. He reports previous drug use.   Family History:  The patient's family history includes Heart disease in his father; Hyperlipidemia in his mother.    ROS:  General:no colds or fevers, no weight changes Skin:no rashes or ulcers HEENT:no blurred vision, no congestion CV:see HPI PUL:see HPI GI:no diarrhea constipation or melena, no indigestion GU:no hematuria, no dysuria MS:+ rt knee pain, no claudication Neuro:no syncope, no lightheadedness Endo:no diabetes, no thyroid disease  Wt Readings from Last 3 Encounters:  04/21/18 250 lb 12.8 oz (113.8 kg)  03/05/18 229 lb 12.8 oz (104.2 kg)  03/03/18 226 lb 12.8 oz (102.9 kg)     PHYSICAL EXAM: VS:  BP (!) 148/96   Pulse 73   Ht 5' 10.5" (1.791 m)   Wt 250 lb 12.8 oz (113.8 kg)   SpO2 98%   BMI 35.48 kg/m  , BMI Body mass index is 35.48 kg/m. General:Pleasant affect, NAD Skin:Warm and dry, brisk capillary refill HEENT:normocephalic, sclera clear, mucus membranes moist Neck:supple, no JVD, no bruits  Heart:S1S2 RRR without murmur, gallup, rub or click Lungs:clear without rales, rhonchi, or wheezes ZOX:WRUEAbd:soft, non tender, + BS, do not palpate liver spleen or masses Ext:no lower ext edema, 2+ post tibial pulses, 2+ radial pulses no rt femoral artery bruit, no crepitous.  Neuro:alert and oriented  X 3, MAE, follows commands, + facial symmetry    EKG:  EKG is ordered today. The ekg ordered today demonstrates SR and no changes.   Recent Labs: 03/05/2018: ALT 26; Hemoglobin 13.1; Platelets 221 04/21/2018: BUN 12; Creatinine, Ser 1.02; Magnesium 2.1; Potassium 4.8; Sodium 144; TSH 1.410    Lipid Panel    Component Value Date/Time   CHOL 124 03/05/2018 0917   TRIG 47 03/05/2018 0917   HDL 51 03/05/2018 0917   CHOLHDL 2.4 03/05/2018 0917   CHOLHDL 4.9 01/07/2018 1131   VLDL 26 01/07/2018 1131   LDLCALC 64 03/05/2018 0917       Other studies Reviewed: Additional studies/ records that were reviewed today include:  cardiac cath 01/03/18.  CULPRIT BIFURCATION LESION: Prox LAD lesion is 60% stenosed with 75% stenosed side branch in Ost 1st Diag. Prox LAD to Mid LAD lesion is 100% stenosed.  A drug-eluting stent was successfully placed (beginning proximal to diagonal branch) using a STENT SYNERGY DES 3X28. -Postdilated to 3.6 mm. Post intervention, there is a 0% residual stenosis in the LAD  Balloon angioplasty was performed on Ost 1st Diag using a BALLOON SAPPHIRE 2.0X12. Post intervention, the side branch was reduced to 20% residual stenosis.  ----------------------------------------------------------  Ost Ramus lesion is 65% stenosed.  ----------------------------------------------------------  There is mild left ventricular systolic dysfunction. The left ventricular ejection fraction is 45-50% by visual estimate.  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  Successful PCI of LAD with DES & PTCA of ost-prox 1st Diag  EF ~45% with distal Anterior-anterolateral hypokinesis.  Minimally Elevated LVEDP & PCWP with normal RHC pressures -borderline but not true cardiogenic shock  Low Normal CO/CI (with Neosynephrine off).  Acute Respiratory Failure with Acidemia  Metabolic Acidosis - treated with 2 Amp NaHCO3  Echo 01/04/18 Study Conclusions  - Left ventricle: The cavity size was mildly reduced. There was   severe concentric hypertrophy. Systolic function was normal. The   estimated ejection fraction was in the range of 55% to 60%. Mild   hypokinesis of the apical anterior myocardium; consistent with   ischemia in the distribution of the distal left anterior   descending coronary artery. Doppler parameters are consistent    with abnormal left ventricular relaxation (grade 1 diastolic   dysfunction). Acoustic contrast opacification revealed no   evidence ofthrombus.   ASSESSMENT AND PLAN:  1. Fluttering may be PACs but will check event monitor to eval.  Will check labs as well.  Keep appt next week.  I am keeping him out of work for now until monitor results back.    2.  HTN  Increase cozaar to 50 mg daily  And I am checking BMP  3.  Rt knee pain, no swelling, will check venous doppler to rule out DVT and bakers cyst.  No bruit at Rt femoral cath site and no swelling.  Asked him to try biofreeze and if no DVT to see PCP may be arthritis or other orthopedic issue.  His heart cath was 3 months ago.  4.  CAD with cardiac arrest.  No chest pain but + fluttering, no EKG changes.   Continue ASA, Brilinta, BB and statin.  Current medicines are reviewed with the patient today.  The patient Has no concerns regarding medicines.  The following changes have been made:  See above Labs/ tests ordered today include:see above  Disposition:   FU:  see above  Signed, Nada Boozer, NP  04/22/2018 9:31 PM    [facility] [address] [place], [place], [place]  [address]/ [address] [place], [place] Phone: [phone]; Fax: [phone]  [phone]

## 2018-04-21 NOTE — Telephone Encounter (Signed)
Spoke to patient - he states  His biggest concerned is bilaterall eg  Tightness and burning. Patient states the right is worse than left burning and tightness  Especially  From knee to shin.  Patient states no swelling , or discoloration .  Patient also states  " funny feeling in his chest"   States he is stil becoming very fatigue after being up for 5 hours everyday.   Appointment schedule for today at 3 [address] office . Patient is aware and address given an directions. Patient verbalized understanding.

## 2018-04-22 ENCOUNTER — Telehealth: Payer: Self-pay | Admitting: Cardiology

## 2018-04-22 ENCOUNTER — Ambulatory Visit (HOSPITAL_COMMUNITY)
Admission: RE | Admit: 2018-04-22 | Discharge: 2018-04-22 | Disposition: A | Discharge status: Home / Self Care | Payer: No Typology Code available for payment source | Source: Ambulatory Visit | Attending: Cardiology | Admitting: Cardiology

## 2018-04-22 ENCOUNTER — Encounter: Payer: Self-pay | Admitting: Cardiology

## 2018-04-22 DIAGNOSIS — M79604 Pain in right leg: Secondary | ICD-10-CM | POA: Insufficient documentation

## 2018-04-22 LAB — BASIC METABOLIC PANEL
BUN/Creatinine Ratio: 12 (ref 9–20)
BUN: 12 mg/dL (ref 6–24)
CO2: 18 mmol/L — AB (ref 20–29)
CREATININE: 1.02 mg/dL (ref 0.76–1.27)
Calcium: 9.9 mg/dL (ref 8.7–10.2)
Chloride: 103 mmol/L (ref 96–106)
GFR, EST AFRICAN AMERICAN: 100 mL/min/{1.73_m2} (ref >59)
GFR, EST NON AFRICAN AMERICAN: 87 mL/min/{1.73_m2} (ref >59)
Glucose: 104 mg/dL — ABNORMAL HIGH (ref 65–99)
Potassium: 4.8 mmol/L (ref 3.5–5.2)
SODIUM: 144 mmol/L (ref 134–144)

## 2018-04-22 LAB — MAGNESIUM: Magnesium: 2.1 mg/dL (ref 1.6–2.3)

## 2018-04-22 LAB — TSH: TSH: 1.41 u[IU]/mL (ref 0.450–4.500)

## 2018-04-22 MED FILL — LOSARTAN POTASSIUM 50 MG TA: 50 | 30 days supply | Qty: 30 | Fill #0

## 2018-04-22 NOTE — Telephone Encounter (Signed)
New Message   Marcelino Duster is calling about the test the pt had done today  VAS [place] LOWER EXTREMITY VENOUS (DVT)  Please call

## 2018-04-22 NOTE — Progress Notes (Signed)
Right lower extremity venous duplex completed. Refer to "CV Proc" under chart review to view preliminary results.  04/22/2018 1:17 PM Gertie Fey, MHA, RVT, RDCS, RDMS

## 2018-04-22 NOTE — Telephone Encounter (Signed)
Rodney Lloyd at [facility] vascular lab called with results and for recommendations for patient. Rodney Lloyd had LE venous doppler.  This is negative per Rodney Lloyd.  The patient wanted to know what Rodney Lloyd should do next. I adv that once the report is generated to the ordering provider Rodney Lloyd would be contacted.  Rodney Lloyd was seen by L. Ingold yesterday in clinic.

## 2018-04-23 ENCOUNTER — Telehealth: Payer: Self-pay | Admitting: *Deleted

## 2018-04-23 NOTE — Telephone Encounter (Signed)
-----   Message from Leone Brand, NP sent at 04/22/2018  9:05 PM EST ----- Good news no blood clots and no swelling behind the knee.  He should see his PCP- may be arthritis. Also his labs were normal.  He could try alternating heat and ice to knee.  Tylenol for pain.   But also important to control BP so keep appt for next week since we increased his BP med.

## 2018-04-23 NOTE — Telephone Encounter (Signed)
Called pt about Lower Extremity Vas [place] results, left a message for pt to call back.

## 2018-04-23 NOTE — Telephone Encounter (Signed)
Follow up  ° °Patient is returning call in reference to test results. Please call.  °

## 2018-04-23 NOTE — Telephone Encounter (Signed)
Returned pts call and he has been made aware of his Vas [place] of lowe extremities. Pt thanked me for the call back.

## 2018-04-29 ENCOUNTER — Other Ambulatory Visit: Payer: Self-pay | Admitting: Adult Health

## 2018-04-29 ENCOUNTER — Ambulatory Visit (INDEPENDENT_AMBULATORY_CARE_PROVIDER_SITE_OTHER): Payer: No Typology Code available for payment source | Admitting: Adult Health

## 2018-04-29 ENCOUNTER — Ambulatory Visit
Admission: RE | Admit: 2018-04-29 | Discharge: 2018-04-29 | Disposition: A | Discharge status: Home / Self Care | Payer: No Typology Code available for payment source | Source: Ambulatory Visit | Attending: Adult Health | Admitting: Adult Health

## 2018-04-29 ENCOUNTER — Encounter: Payer: Self-pay | Admitting: Adult Health

## 2018-04-29 VITALS — BP 140/88 | HR 82 | Ht 70.0 in | Wt 249.0 lb

## 2018-04-29 DIAGNOSIS — M25469 Effusion, unspecified knee: Secondary | ICD-10-CM

## 2018-04-29 DIAGNOSIS — F1910 Other psychoactive substance abuse, uncomplicated: Secondary | ICD-10-CM

## 2018-04-29 DIAGNOSIS — R079 Chest pain, unspecified: Secondary | ICD-10-CM

## 2018-04-29 DIAGNOSIS — R002 Palpitations: Secondary | ICD-10-CM

## 2018-04-29 DIAGNOSIS — I2510 Atherosclerotic heart disease of native coronary artery without angina pectoris: Secondary | ICD-10-CM

## 2018-04-29 NOTE — Patient Instructions (Signed)
Special instructions: Your physician has requested that you have an View sunrise view of your RIGHT KNEE. This is called the sunrise view because the patella appears to be rising over the horizon. This view is taken with the knee flexed. The radiograph is taken with the x-ray beam tangential to the patella parallel to the long axis of the lower extremity. THIS WILL BE DONE AT [facility] [address] MAP PROVIDED  PLEASE CALL YOUR PCP FOR FOLLOW UP APPOINTMENT  Follow-Up: You will need a follow up appointment AFTER MONITOR WITH Bryan Lemma, MD, Joni Reining, DNP, AACC or one of the following Advanced Practice Providers on your designated Care Team:  Theodore Demark, PA-C, Joni Reining, DNP, AACC  Medication Instructions:  NO CHANGES- Your physician recommends that you continue on your current medications as directed. Please refer to the Current Medication list given to you today. If you need a refill on your cardiac medications before your next appointment, please call your pharmacy. Labwork: When you have labs (blood work) and your tests are completely normal, you will receive your results ONLY by MyChart Message (if you have MyChart) -OR- A paper copy in the mail.  At [facility], you and your health needs are our priority.  As part of our continuing mission to provide you with exceptional heart care, we have created designated Provider Care Teams.  These Care Teams include your primary Cardiologist (physician) and Advanced Practice Providers (APPs -  Physician Assistants and Nurse Practitioners) who all work together to provide you with the care you need, when you need it.  Thank you for choosing [facility] at [facility]!!

## 2018-04-29 NOTE — Progress Notes (Signed)
Cardiology Office Note   Date:  04/29/2018   ID:  Rodney Lloyd, DOB 06/02/1970, MRN [id number]  PCP:  Bing NeighborsHarris, Kimberly S, FNP  Cardiologist:  Herbie BaltimoreHarding   Chief Complaint  Patient presents with  . Coronary Artery Disease  . Hypertension  . Knee Pain     History of Present Illness: Rodney Lloyd is a 48 y.o. male who presents for ongoing assessment and management of CAD, severe 2 vessel CAD with 100% mLAD, 80% 1st diagonal, and 70% ramus intermedius. He had successful PCI of the LAD with DES and PTCA of the ostial-proximal 1st diagonal. EF 45%.   Other history includes tobacco abuse, cocaine abuse, HTN, and ETOH abuse. On last visit with Dr. Herbie BaltimoreHarding on 04/21/2018,he had abstained from ETOH and cocaine, was decreasing his cigarette use. He was medically compliant. He complained of chest fluttering and a cardiac monitor was placed. A venous doppler ultrasound was completed to evaluate right knee pain to r/o DVT. He was ruled out for DVT. Cardiac monitor is pending.   He comes today with some anxiety and ongoing pan in his right knee. He states that he has had some palpitations which wake him at night. He is unable to sleep sometimes as a result. He continues to limp on the right leg due to knee pain.   Past Medical History:  Diagnosis Date  . Alcohol abuse   . CAD (coronary artery disease), native coronary artery    s/p PCI to LAD 01/2018  . Cocaine abuse (HCC)   . HLD (hyperlipidemia)   . Hypertension   . STEMI (ST elevation myocardial infarction) (HCC)   . Tobacco abuse     Past Surgical History:  Procedure Laterality Date  . CORONARY/GRAFT ACUTE MI REVASCULARIZATION N/A 01/03/2018   Procedure: Coronary/Graft Acute MI Revascularization;  Surgeon: Marykay LexHarding, David W, MD;  [facility];  Service: Cardiovascular;  Laterality: N/A;  . RIGHT/LEFT HEART CATH AND CORONARY ANGIOGRAPHY N/A 01/03/2018   Procedure: RIGHT/LEFT HEART CATH AND CORONARY ANGIOGRAPHY;  Surgeon: Marykay LexHarding,  David W, MD;  [facility];  Service: Cardiovascular;  Laterality: N/A;     Current Outpatient Medications  Medication Sig Dispense Refill  . acetaminophen (TYLENOL) 325 MG tablet Take 2 tablets (650 mg total) by mouth every 4 (four) hours as needed for headache or mild pain.    Marland Kitchen. aspirin 81 MG chewable tablet Chew 1 tablet (81 mg total) by mouth daily.    Marland Kitchen. atorvastatin (LIPITOR) 40 MG tablet Take 1 tablet (40 mg total) by mouth daily. 90 tablet 4  . carvedilol (COREG) 12.5 MG tablet Take 1 tablet (12.5 mg total) by mouth 2 (two) times daily with a meal. 180 tablet 4  . losartan (COZAAR) 50 MG tablet Take 1 tablet (50 mg total) by mouth daily. 90 tablet 3  . nicotine (NICODERM CQ - DOSED IN MG/24 HOURS) 14 mg/24hr patch Place 1 patch onto the skin daily.    . ticagrelor (BRILINTA) 90 MG TABS tablet Take 1 tablet (90 mg total) by mouth 2 (two) times daily. 180 tablet 10  . nitroGLYCERIN (NITROSTAT) 0.4 MG SL tablet Place 1 tablet (0.4 mg total) under the tongue every 5 (five) minutes as needed for chest pain. (Patient not taking: Reported on 04/29/2018) 25 tablet 2   No current facility-administered medications for this visit.     Allergies:   Patient has no known allergies.    Social History:  The patient  reports that  he has been smoking cigarettes. He has been smoking about 0.10 packs per day. He has never used smokeless tobacco. He reports previous alcohol use. He reports previous drug use.   Family History:  The patient's family history includes Heart disease in his father; Hyperlipidemia in his mother.    ROS: All other systems are reviewed and negative. Unless otherwise mentioned in H&P    PHYSICAL EXAM: VS:  BP 140/88   Pulse 82   Ht 5\' 10"  (1.778 m)   Wt 249 lb (112.9 kg)   SpO2 99%   BMI 35.73 kg/m  , BMI Body mass index is 35.73 kg/m. GEN: Well nourished, well developed, in no acute distress HEENT: normal Neck: no JVD, carotid bruits, or  masses Cardiac: RRR; no murmurs, rubs, or gallops,no edema  Respiratory:  Clear to auscultation bilaterally, normal work of breathing GI: soft, nontender, nondistended, + BS MS: no deformity or atrophy.Right knee pain with ROM. May have some mild amount of effusion.  Skin: warm and dry, no rash Neuro:  Strength and sensation are intact Psych: euthymic mood, full affect   EKG:  NSR, LAE, mild LVH. Rate of 82 bpm, anterior Q waves.   Recent Labs: 03/05/2018: ALT 26; Hemoglobin 13.1; Platelets 221 04/21/2018: BUN 12; Creatinine, Ser 1.02; Magnesium 2.1; Potassium 4.8; Sodium 144; TSH 1.410    Lipid Panel    Component Value Date/Time   CHOL 124 03/05/2018 0917   TRIG 47 03/05/2018 0917   HDL 51 03/05/2018 0917   CHOLHDL 2.4 03/05/2018 0917   CHOLHDL 4.9 01/07/2018 1131   VLDL 26 01/07/2018 1131   LDLCALC 64 03/05/2018 0917      Wt Readings from Last 3 Encounters:  04/29/18 249 lb (112.9 kg)  04/21/18 250 lb 12.8 oz (113.8 kg)  03/05/18 229 lb 12.8 oz (104.2 kg)      Other studies Reviewed: Additional studies/ records that were reviewed today include: cardiac cath 01/03/18.  CULPRIT BIFURCATION LESION: Prox LAD lesion is 60% stenosed with 75% stenosed side branch in Ost 1st Diag. Prox LAD to Mid LAD lesion is 100% stenosed.  A drug-eluting stent was successfully placed (beginning proximal to diagonal branch) using a STENT SYNERGY DES 3X28. -Postdilated to 3.6 mm. Post intervention, there is a 0% residual stenosis in the LAD  Balloon angioplasty was performed on Ost 1st Diag using a BALLOON SAPPHIRE 2.0X12. Post intervention, the side branch was reduced to 20% residual stenosis.  ----------------------------------------------------------  Ost Ramus lesion is 65% stenosed.  ----------------------------------------------------------  There is mild left ventricular systolic dysfunction. The left ventricular ejection fraction is 45-50% by visual estimate.  LV End Diastolic  Pressure is mildly elevated pre-PCI, however post PCI down to 13 mmHg and PCWP 11 mmHg  NORMAL RIGHT HEART CATH PRESSURES  SUMMARY  Ventricular Tachycardia / Fibrillation Cardiac Arrest 2/2 AnteroLateral-Inferior STEMI  Severe 2-3 Vessel CAD - 100% mLAD, 80% 1st Diag & 70% Rmus Intermedius (RI).  Successful PCI of LAD with DES & PTCA of ost-prox 1st Diag  EF ~45% with distal Anterior-anterolateral hypokinesis.  Minimally Elevated LVEDP & PCWP with normal RHC pressures -borderline but not true cardiogenic shock  Low Normal CO/CI (with Neosynephrine off).  Acute Respiratory Failure with Acidemia  Metabolic Acidosis - treated with 2 Amp NaHCO3   Echo 01/04/18 Study Conclusions  - Left ventricle: The cavity size was mildly reduced. There was severe concentric hypertrophy. Systolic function was normal. The estimated ejection fraction was in the range of 55% to 60%. Mild  hypokinesis of the apical anterior myocardium; consistent with ischemia in the distribution of the distal left anterior descending coronary artery. Doppler parameters are consistent with abnormal left ventricular relaxation (grade 1 diastolic dysfunction). Acoustic contrast opacification revealed no evidence ofthrombus. Past Medical History:  Diagnosis Date  . Alcohol abuse      ASSESSMENT AND PLAN:  1. CAD: Hx of severe 2 vessel CAD, and PCI of the mid LAD and ostial proximal 1st diagonal. He is continuing to have some palpitations, and occasional chest discomfort. He has not had to take NTG. I believe some of this is anxiety related as he is very sensitive now to any changes in his heart or pain.   He is to have 14 day cardiac monitor placed next week.  Continue current regimen for now. Last echo with EF of 45%.   2. Right knee pain: R/O effusion. I will have X-rays completed. He is to see his PCP for follow up about this. X-rays done to help with care plan.   3. Polysubstance abuse: He  continues to smoke, but is down to 1-2 cigarettes a day.   4. Anxiety over health: Even though he has addiction history, he may benefit from low dose antianxiety or antidepressant. Will defer to PCP for their recommendations.   Current medicines are reviewed at length with the patient today.    Labs/ tests ordered today include: Right knee films.   Bettey Mare. Liborio Nixon, ANP, AACC   04/29/2018 9:25 AM    [facility] [address] Office [phone] Fax [phone]

## 2018-05-07 ENCOUNTER — Ambulatory Visit (INDEPENDENT_AMBULATORY_CARE_PROVIDER_SITE_OTHER): Payer: Self-pay | Admitting: Family Medicine

## 2018-05-07 ENCOUNTER — Encounter: Payer: Self-pay | Admitting: Family Medicine

## 2018-05-07 ENCOUNTER — Ambulatory Visit (INDEPENDENT_AMBULATORY_CARE_PROVIDER_SITE_OTHER): Payer: No Typology Code available for payment source

## 2018-05-07 VITALS — BP 146/81 | HR 91 | Temp 97.5°F | Resp 17 | Ht 70.0 in | Wt 251.4 lb

## 2018-05-07 DIAGNOSIS — M25561 Pain in right knee: Secondary | ICD-10-CM

## 2018-05-07 DIAGNOSIS — R002 Palpitations: Secondary | ICD-10-CM

## 2018-05-07 DIAGNOSIS — N522 Drug-induced erectile dysfunction: Secondary | ICD-10-CM

## 2018-05-07 DIAGNOSIS — G4700 Insomnia, unspecified: Secondary | ICD-10-CM

## 2018-05-07 MED ORDER — CYCLOBENZAPRINE HCL 10 MG PO TABS: 10.0000 mg | ORAL_TABLET | Freq: Two times a day (BID) | ORAL | 1 refills | Status: DC | PRN

## 2018-05-07 MED ORDER — PREDNISONE 20 MG PO TABS: ORAL_TABLET | ORAL | 0 refills | Status: DC

## 2018-05-07 MED ORDER — TADALAFIL 2.5 MG PO TABS: 2.5000 mg | ORAL_TABLET | Freq: Every day | ORAL | 3 refills | Status: DC | PRN

## 2018-05-07 MED FILL — predniSONE 20 MG TABS: 20 | 5 days supply | Qty: 10 | Fill #0

## 2018-05-07 MED FILL — TADALAFIL 2.5 MG TABS: 2.5 | 30 days supply | Qty: 10 | Fill #0

## 2018-05-07 MED FILL — CYCLOBENZAPRINE 10 MG TAB: 10 | 15 days supply | Qty: 30 | Fill #0 | Status: TO

## 2018-05-07 NOTE — Patient Instructions (Signed)
If you experience chest pain and take nitroglycerin avoid Cialis until medical evaluation. Medication interactions of nitroglycerin and Cialis can cause harmful side effects.   For knee pain, I have prednisone 40 mg once daily with breakfast x5 days to reduce inflammation and help with pain.  For acute knee pain I have prescribed cyclobenzaprine 10 mg he may take up to twice daily however avoid driving as medication causes drowsiness.    I have referred you to orthopedic specialty.  You will be contacted to schedule an appointment.    Erectile Dysfunction Erectile dysfunction (ED) is the inability to get or keep an erection in order to have sexual intercourse. Erectile dysfunction may include:  Inability to get an erection.  Lack of enough hardness of the erection to allow penetration.  Loss of the erection before sex is finished. What are the causes? This condition may be caused by:  Certain medicines, such as: ? Pain relievers. ? Antihistamines. ? Antidepressants. ? Blood pressure medicines. ? Water pills (diuretics). ? Ulcer medicines. ? Muscle relaxants. ? Drugs.  Excessive drinking.  Psychological causes, such as: ? Anxiety. ? Depression. ? Sadness. ? Exhaustion. ? Performance fear. ? Stress.  Physical causes, such as: ? Artery problems. This may include diabetes, smoking, liver disease, or atherosclerosis. ? High blood pressure. ? Hormonal problems, such as low testosterone. ? Obesity. ? Nerve problems. This may include back or pelvic injuries, diabetes mellitus, multiple sclerosis, or Parkinson disease. What are the signs or symptoms? Symptoms of this condition include:  Inability to get an erection.  Lack of enough hardness of the erection to allow penetration.  Loss of the erection before sex is finished.  Normal erections at some times, but with frequent unsatisfactory episodes.  Low sexual satisfaction in either partner due to erection  problems.  A curved penis occurring with erection. The curve may cause pain or the penis may be too curved to allow for intercourse.  Never having nighttime erections. How is this diagnosed? This condition is often diagnosed by:  Performing a physical exam to find other diseases or specific problems with the penis.  Asking you detailed questions about the problem.  Performing blood tests to check for diabetes mellitus or to measure hormone levels.  Performing other tests to check for underlying health conditions.  Performing an ultrasound exam to check for scarring.  Performing a test to check blood flow to the penis.  Doing a sleep study at home to measure nighttime erections. How is this treated? This condition may be treated by:  Medicine taken by mouth to help you achieve an erection (oral medicine).  Hormone replacement therapy to replace low testosterone levels.  Medicine that is injected into the penis. Your health care provider may instruct you how to give yourself these injections at home.  Vacuum pump. This is a pump with a ring on it. The pump and ring are placed on the penis and used to create pressure that helps the penis become erect.  Penile implant surgery. In this procedure, you may receive: ? An inflatable implant. This consists of cylinders, a pump, and a reservoir. The cylinders can be inflated with a fluid that helps to create an erection, and they can be deflated after intercourse. ? A semi-rigid implant. This consists of two silicone rubber rods. The rods provide some rigidity. They are also flexible, so the penis can both curve downward in its normal position and become straight for sexual intercourse.  Blood vessel surgery, to improve  blood flow to the penis. During this procedure, a blood vessel from a different part of the body is placed into the penis to allow blood to flow around (bypass) damaged or blocked blood vessels.  Lifestyle changes, such as  exercising more, losing weight, and quitting smoking. Follow these instructions at home: Medicines   Take over-the-counter and prescription medicines only as told by your health care provider. Do not increase the dosage without first discussing it with your health care provider.  If you are using self-injections, perform injections as directed by your health care provider. Make sure to avoid any veins that are on the surface of the penis. After giving an injection, apply pressure to the injection site for 5 minutes. General instructions  Exercise regularly, as directed by your health care provider. Work with your health care provider to lose weight, if needed.  Do not use any products that contain nicotine or tobacco, such as cigarettes and e-cigarettes. If you need help quitting, ask your health care provider.  Before using a vacuum pump, read the instructions that come with the pump and discuss any questions with your health care provider.  Keep all follow-up visits as told by your health care provider. This is important. Contact a health care provider if:  You feel nauseous.  You vomit. Get help right away if:  You are taking oral or injectable medicines and you have an erection that lasts longer than 4 hours. If your health care provider is unavailable, go to the nearest emergency room for evaluation. An erection that lasts much longer than 4 hours can result in permanent damage to your penis.  You have severe pain in your groin or abdomen.  You develop redness or severe swelling of your penis.  You have redness spreading up into your groin or lower abdomen.  You are unable to urinate.  You experience chest pain or a rapid heart beat (palpitations) after taking oral medicines. Summary  Erectile dysfunction (ED) is the inability to get or keep an erection during sexual intercourse. This problem can usually be treated successfully.  This condition is diagnosed based on a  physical exam, your symptoms, and tests to determine the cause. Treatment varies depending on the cause, and may include medicines, hormone therapy, surgery, or vacuum pump.  You may need follow-up visits to make sure that you are using your medicines or devices correctly.  Get help right away if you are taking or injecting medicines and you have an erection that lasts longer than 4 hours. This information is not intended to replace advice given to you by your health care provider. Make sure you discuss any questions you have with your health care provider. Document Released: 02/16/2000 Document Revised: 03/06/2016 Document Reviewed: 03/06/2016 Elsevier Interactive Patient Education  2019 [employer]  Acute Knee Pain, Adult Many things can cause knee pain. Sometimes, knee pain is sudden (acute) and may be caused by damage, swelling, or irritation of the muscles and tissues that support your knee. The pain often goes away on its own with time and rest. If the pain does not go away, tests may be done to find out what is causing the pain. Follow these instructions at home: Pay attention to any changes in your symptoms. Take these actions to relieve your pain. If you have a knee sleeve or brace:   Wear the sleeve or brace as told by your doctor. Remove it only as told by your doctor.  Loosen the sleeve or brace  if your toes: ? Tingle. ? Become numb. ? Turn cold and blue.  Keep the sleeve or brace clean.  If the sleeve or brace is not waterproof: ? Do not let it get wet. ? Cover it with a watertight covering when you take a bath or shower. Activity  Rest your knee.  Do not do things that cause pain.  Avoid activities where both feet leave the ground at the same time (high-impact activities). Examples are running, jumping rope, and doing jumping jacks.  Work with a physical therapist to make a safe exercise program, as told by your doctor. Managing pain, stiffness, and  swelling   If told, put ice on the knee: ? Put ice in a plastic bag. ? Place a towel between your skin and the bag. ? Leave the ice on for 20 minutes, 2-3 times a day.  If told, put pressure (compression) on your injured knee to control swelling, give support, and help with discomfort. Compression may be done with an elastic bandage. General instructions  Take all medicines only as told by your doctor.  Raise (elevate) your knee while you are sitting or lying down. Make sure your knee is higher than your heart.  Sleep with a pillow under your knee.  Do not use any products that contain nicotine or tobacco. These include cigarettes, e-cigarettes, and chewing tobacco. These products may slow down healing. If you need help quitting, ask your doctor.  If you are overweight, work with your doctor and a food expert (dietitian) to set goals to lose weight. Being overweight can make your knee hurt more.  Keep all follow-up visits as told by your doctor. This is important. Contact a doctor if:  The knee pain does not stop.  The knee pain changes or gets worse.  You have a fever along with knee pain.  Your knee feels warm when you touch it.  Your knee gives out or locks up. Get help right away if:  Your knee swells, and the swelling gets worse.  You cannot move your knee.  You have very bad knee pain. Summary  Many things can cause knee pain. The pain often goes away on its own with time and rest.  Your doctor may do tests to find out the cause of the pain.  Pay attention to any changes in your symptoms. Relieve your pain with rest, medicines, light activity, and use of ice.  Get help right away if you cannot move your knee or your knee pain is very bad. This information is not intended to replace advice given to you by your health care provider. Make sure you discuss any questions you have with your health care provider. Document Released: 05/17/2008 Document Revised:  07/31/2017 Document Reviewed: 07/31/2017 Elsevier Interactive Patient Education  2019 [employer].

## 2018-05-07 NOTE — Progress Notes (Signed)
Patient ID: Rodney Lloyd, male    DOB: 05-14-1970, 48 y.o.   MRN: [id number]  PCP: Bing Neighbors, FNP  Chief Complaint  Patient presents with  . Knee Pain    R knee pain    Subjective:  HPI  Rodney Lloyd is a 48 y.o. male presents for evaluation knee pain, erectile dysfunction, insomnia.   Knee pain Right knee pain rather than 1 month. Seen previously by cardiology and noted to have some right knee swelling during that time.  DVT work-up negative with US vascular study. He continue to complain of knee pain originating in the back of the knee characterizes as a pulling sensation when he bends his knee.  Has any recent or prior injury.  Swelling has seemed to resolve although pain persists.  Had recent imaging of his knee which is unremarkable.  He has had an orthopedic evaluation.  Insomnia  Since MI he has experienced problems going to sleep and staying asleep. He has not taken medication for his problem in the past. Denies alcohol use. He is a former substance abuser and has recently taken steps to enroll in treatment program.   Erectile dysfunction  Problem with achieving an erection has progressively worsened since discharge from the hospital 01/02/2018. This was not an issue prior to starting current medication regimen. No prior medications tried for ED symptoms.   Social History   Socioeconomic History  . Marital status: Divorced    Spouse name: Not on file  . Number of children: Not on file  . Years of education: Not on file  . Highest education level: Not on file  Occupational History  . Not on file  Social Needs  . Financial resource strain: Not on file  . Food insecurity:    Worry: Not on file    Inability: Not on file  . Transportation needs:    Medical: Not on file    Non-medical: Not on file  Tobacco Use  . Smoking status: Current Every Day Smoker    Packs/day: 0.10    Types: Cigarettes  . Smokeless tobacco: Never Used  Substance and Sexual Activity   . Alcohol use: Not Currently  . Drug use: Not Currently  . Sexual activity: Not on file  Lifestyle  . Physical activity:    Days per week: Not on file    Minutes per session: Not on file  . Stress: Not on file  Relationships  . Social connections:    Talks on phone: Not on file    Gets together: Not on file    Attends religious service: Not on file    Active member of club or organization: Not on file    Attends meetings of clubs or organizations: Not on file    Relationship status: Not on file  . Intimate partner violence:    Fear of current or ex partner: Not on file    Emotionally abused: Not on file    Physically abused: Not on file    Forced sexual activity: Not on file  Other Topics Concern  . Not on file  Social History Narrative  . Not on file    Family History  Problem Relation Age of Onset  . Hyperlipidemia Mother   . Heart disease Father    Review of Systems Pertinent negatives listed in HPI Patient Active Problem List   Diagnosis Date Noted  . Aspiration into airway   . Acute encephalopathy   . Cardiogenic shock (HCC)   .  Cardiac arrest due to underlying cardiac condition (HCC) 01/03/2018  . Acute ST elevation myocardial infarction (STEMI) of anterolateral wall (HCC) 01/03/2018  . Cocaine abuse (HCC) 01/03/2018  . Cardiopulmonary resuscitation (CPR)-only resuscitation status   . Essential hypertension 02/15/2015  . Tobacco abuse 02/15/2015    No Known Allergies  Prior to Admission medications   Medication Sig Start Date End Date Taking? Authorizing Provider  acetaminophen (TYLENOL) 325 MG tablet Take 2 tablets (650 mg total) by mouth every 4 (four) hours as needed for headache or mild pain. 01/20/18  Yes Bing Neighbors, FNP  aspirin 81 MG chewable tablet Chew 1 tablet (81 mg total) by mouth daily. 03/03/18  Yes Bing Neighbors, FNP  atorvastatin (LIPITOR) 40 MG tablet Take 1 tablet (40 mg total) by mouth daily. 03/03/18  Yes Bing Neighbors, FNP  carvedilol (COREG) 12.5 MG tablet Take 1 tablet (12.5 mg total) by mouth 2 (two) times daily with a meal. 03/03/18  Yes Bing Neighbors, FNP  losartan (COZAAR) 50 MG tablet Take 1 tablet (50 mg total) by mouth daily. 04/21/18  Yes Leone Brand, NP  ticagrelor (BRILINTA) 90 MG TABS tablet Take 1 tablet (90 mg total) by mouth 2 (two) times daily. 03/03/18  Yes Bing Neighbors, FNP  nicotine (NICODERM CQ - DOSED IN MG/24 HOURS) 14 mg/24hr patch Place 1 patch onto the skin daily.    [provider]  nitroGLYCERIN (NITROSTAT) 0.4 MG SL tablet Place 1 tablet (0.4 mg total) under the tongue every 5 (five) minutes as needed for chest pain. Patient not taking: Reported on 04/29/2018 03/03/18   Bing Neighbors, FNP    Past Medical, Surgical Family and Social History reviewed and updated.    Objective:   Today's Vitals   05/07/18 1502  BP: (!) 146/81  Pulse: 91  Resp: 17  Temp: (!) 97.5 F (36.4 C)  TempSrc: Oral  SpO2: 96%  Weight: 251 lb 6.4 oz (114 kg)  Height: 5\' 10"  (1.778 m)    Wt Readings from Last 3 Encounters:  05/07/18 251 lb 6.4 oz (114 kg)  04/29/18 249 lb (112.9 kg)  04/21/18 250 lb 12.8 oz (113.8 kg)     Physical Exam General appearance: alert, well developed, well nourished, cooperative and in no distress Head: Normocephalic, without obvious abnormality, atraumatic Respiratory: Respirations even and unlabored, normal respiratory rate Heart: rate and rhythm normal. No gallop or murmurs noted on exam  Extremities: Right knee pain-crepitus noted with flexion and extension. No palpable effusion. See imaging: Dg Knee Complete 4 Views Right  Result Date: 04/29/2018 CLINICAL DATA:  Right knee pain worse over the past few weeks. No injury. EXAM: RIGHT KNEE - COMPLETE 4+ VIEW COMPARISON:  None. FINDINGS: No evidence of fracture, dislocation, or joint effusion. No evidence of arthropathy or other focal bone abnormality. Soft tissues are unremarkable.  IMPRESSION: Negative. Electronically Signed   By: Elberta Fortis M.D.   On: 04/29/2018 17:11   Vas [place] Lower Extremity Venous (dvt)  Result Date: 04/22/2018  Lower Venous Study Indications: Pain.  Performing Technologist: Gertie Fey MHA, RDMS, RVT, RDCS  Examination Guidelines: A complete evaluation includes B-mode imaging, spectral Doppler, color Doppler, and power Doppler as needed of all accessible portions of each vessel. Bilateral testing is considered an integral part of a complete examination. Limited examinations for reoccurring indications may be performed as noted.  Right Venous Findings: +---------+---------------+---------+-----------+----------+-------+          CompressibilityPhasicitySpontaneityPropertiesSummary +---------+---------------+---------+-----------+----------+-------+ CFV  Full           Yes      Yes                          +---------+---------------+---------+-----------+----------+-------+ SFJ      Full                                                 +---------+---------------+---------+-----------+----------+-------+ FV Prox  Full                                                 +---------+---------------+---------+-----------+----------+-------+ FV Mid   Full                                                 +---------+---------------+---------+-----------+----------+-------+ FV DistalFull                                                 +---------+---------------+---------+-----------+----------+-------+ PFV      Full                                                 +---------+---------------+---------+-----------+----------+-------+ POP      Full           Yes      Yes                          +---------+---------------+---------+-----------+----------+-------+ PTV      Full                                                 +---------+---------------+---------+-----------+----------+-------+ PERO     Full                                                  +---------+---------------+---------+-----------+----------+-------+  Left Venous Findings: +---+---------------+---------+-----------+----------+-------+    CompressibilityPhasicitySpontaneityPropertiesSummary +---+---------------+---------+-----------+----------+-------+ CFVFull           Yes      Yes                          +---+---------------+---------+-----------+----------+-------+    Summary: Right: There is no evidence of deep vein thrombosis in the lower extremity. No cystic structure found in the popliteal fossa. Left: No evidence of common femoral vein obstruction.  *See table(s) above for measurements and observations. Electronically signed by Lemar LivingsBrandon Cain MD on 04/22/2018 at 2:10:01 PM.    Final     Skin: Skin color, texture, turgor normal. No rashes seen  Psych: Appropriate mood and affect. Neurologic: Mental status: Alert, oriented to person, place, and time, thought content appropriate.  No results found for: POCGLU  Lab Results  Component Value Date   HGBA1C 5.7 (H) 01/06/2018            Assessment & Plan:  1. Acute pain of right knee -Referral placed to orthopedic specialty. -trial prednisone 40 mg x 5 days cyclobenzaprine for pain given addiction history   2. Insomnia, unspecified type -since he prescribed cyclobenzaprine, patient declines any other medications to manage insomnia.  3. Medication Drug-induced erectile dysfunction -trial [place] 2.5 mg as needed one hour prior sexual activity      -The patient was given clear instructions to go to ER or return to medical center if symptoms do not improve, worsen or new problems develop. The patient verbalized understanding.    Joaquin Courts, FNP Primary [facility] at [facility] [address], [place] [address] [phone]: [phone]

## 2018-05-08 MED FILL — LOSARTAN POTASSIUM 25 MG TA: 25 | 30 days supply | Qty: 30 | Fill #2 | Status: TO

## 2018-05-08 MED FILL — BRILINTA 90 MG TABLET: 90 | 30 days supply | Qty: 60 | Fill #2 | Status: TO

## 2018-05-08 MED FILL — ?ATORVASTATIN 40MG TABLET: 40 | 30 days supply | Qty: 30 | Fill #2

## 2018-05-08 MED FILL — ?CARVEDILOL 12.5 MG TABLET: 12.5 | 30 days supply | Qty: 60 | Fill #2

## 2018-05-14 ENCOUNTER — Encounter (INDEPENDENT_AMBULATORY_CARE_PROVIDER_SITE_OTHER): Payer: Self-pay | Admitting: Surgery

## 2018-05-14 ENCOUNTER — Ambulatory Visit (INDEPENDENT_AMBULATORY_CARE_PROVIDER_SITE_OTHER): Payer: Self-pay | Admitting: Surgery

## 2018-05-14 DIAGNOSIS — I10 Essential (primary) hypertension: Secondary | ICD-10-CM

## 2018-05-14 DIAGNOSIS — R600 Localized edema: Secondary | ICD-10-CM

## 2018-05-14 DIAGNOSIS — F17200 Nicotine dependence, unspecified, uncomplicated: Secondary | ICD-10-CM

## 2018-05-14 DIAGNOSIS — M25561 Pain in right knee: Secondary | ICD-10-CM

## 2018-05-14 MED ORDER — LIDOCAINE HCL 1 % IJ SOLN
3.0000 mL | INTRAMUSCULAR | Status: AC | PRN
  Administered 2018-05-14: 3 mL

## 2018-05-14 MED ORDER — BUPIVACAINE HCL 0.25 % IJ SOLN
6.0000 mL | INTRAMUSCULAR | Status: AC | PRN
  Administered 2018-05-14: 6 mL via INTRA_ARTICULAR

## 2018-05-14 MED ORDER — METHYLPREDNISOLONE ACETATE 40 MG/ML IJ SUSP
40.0000 mg | INTRAMUSCULAR | Status: AC | PRN
  Administered 2018-05-14: 40 mg via INTRA_ARTICULAR

## 2018-05-14 NOTE — Progress Notes (Signed)
Office Visit Note   Patient: Rodney Lloyd           Date of Birth: September 15, 1970           MRN: [id number] Visit Date: 05/14/2018              Requested by: Bing Neighbors, FNP [address] [place], [place] [address] PCP: Bing Neighbors, FNP   Assessment & Plan: Visit Diagnoses:  1. Acute pain of right knee     Plan: In hopes of giving patient relief of his knee pain and turning what his problem is recommended performing intra-articular injection.  After patient consent right knee was prepped Betadine and Marcaine/Depo-Medrol intra-articular injection performed.  Follow-up me in 3 weeks for recheck.  If he continues to be symptomatic I may consider getting an MRI to rule out meniscal tear.  Follow-Up Instructions: Return in about 3 weeks (around 06/04/2018) for with james.   Orders:  No orders of the defined types were placed in this encounter.  No orders of the defined types were placed in this encounter.     Procedures: Large Joint Inj: R knee on 05/14/2018 1:40 PM Indications: pain Details: 25 G needle, anteromedial approach  Arthrogram: No  Medications: 3 mL lidocaine 1 %; 6 mL bupivacaine 0.25 %; 40 mg methylPREDNISolone acetate 40 MG/ML Outcome: tolerated well, no immediate complications Consent was given by the patient. Patient was prepped and draped in the usual sterile fashion.       Clinical Data: No additional findings.   Subjective: Chief Complaint  Patient presents with  . Right Knee - Pain    HPI 48 year old black male who is new patient to the office comes in today with complaints of right knee pain.  States that knee is has been getting worse over the last 2 months.  No specific injury recalled.  Problem actually started shortly after he had his MI had stent placement.  Describes having most of his pain along the medial anterior knee.  Swelling and stiffness posterior aspect.  No true mechanical symptoms or feeling of instability.   Pain bothers him when he is ambulating, squatting, going up and down stairs.  No lumbar spine radicular component. Review of Systems No current cardiac pulmonary GI GU issues  Objective: Vital Signs: There were no vitals taken for this visit.  Physical Exam Constitutional:      Appearance: Normal appearance.  HENT:     Head: Normocephalic and atraumatic.  Eyes:     Pupils: Pupils are equal, round, and reactive to light.  Pulmonary:     Effort: No respiratory distress.  Musculoskeletal:     Comments: Gait is somewhat antalgic.  Negative log about his.  Negative straight leg raise.  Right knee has good range of motion.  No much by the way of swelling.  Tender along the medial joint line.  Small Baker's cyst.  Cruciate collateral ligaments are stable.  Negative patellar apprehension.  Mild discomfort with McMurray's testing.  Mild to moderately tender medial plica.  Calf nontender.  He does have 2+ pretibial pitting edema on the right.  Skin:    General: Skin is warm and dry.  Neurological:     General: No focal deficit present.     Mental Status: He is alert and oriented to person, place, and time.  Psychiatric:        Mood and Affect: Mood normal.        Behavior: Behavior normal.  Ortho Exam  Specialty Comments:  No specialty comments available.  Imaging: No results found.   PMFS History: Patient Active Problem List   Diagnosis Date Noted  . Aspiration into airway   . Acute encephalopathy   . Cardiogenic shock (HCC)   . Cardiac arrest due to underlying cardiac condition (HCC) 01/03/2018  . Acute ST elevation myocardial infarction (STEMI) of anterolateral wall (HCC) 01/03/2018  . Cocaine abuse (HCC) 01/03/2018  . Cardiopulmonary resuscitation (CPR)-only resuscitation status   . Essential hypertension 02/15/2015  . Tobacco abuse 02/15/2015   Past Medical History:  Diagnosis Date  . Alcohol abuse   . CAD (coronary artery disease), native coronary artery    s/p  PCI to LAD 01/2018  . Cocaine abuse (HCC)   . HLD (hyperlipidemia)   . Hypertension   . STEMI (ST elevation myocardial infarction) (HCC)   . Tobacco abuse     Family History  Problem Relation Age of Onset  . Hyperlipidemia Mother   . Heart disease Father     Past Surgical History:  Procedure Laterality Date  . CORONARY/GRAFT ACUTE MI REVASCULARIZATION N/A 01/03/2018   Procedure: Coronary/Graft Acute MI Revascularization;  Surgeon: Marykay Lex, MD;  Location: [facility] INVASIVE CV LAB;  Service: Cardiovascular;  Laterality: N/A;  . RIGHT/LEFT HEART CATH AND CORONARY ANGIOGRAPHY N/A 01/03/2018   Procedure: RIGHT/LEFT HEART CATH AND CORONARY ANGIOGRAPHY;  Surgeon: Marykay Lex, MD;  Location: [facility] INVASIVE CV LAB;  Service: Cardiovascular;  Laterality: N/A;   Social History   Occupational History  . Not on file  Tobacco Use  . Smoking status: Current Every Day Smoker    Packs/day: 0.10    Types: Cigarettes  . Smokeless tobacco: Never Used  Substance and Sexual Activity  . Alcohol use: Not Currently  . Drug use: Not Currently  . Sexual activity: Not on file

## 2018-06-03 ENCOUNTER — Telehealth (INDEPENDENT_AMBULATORY_CARE_PROVIDER_SITE_OTHER): Payer: Self-pay | Admitting: Radiology

## 2018-06-03 NOTE — Telephone Encounter (Signed)
Answered no to all screening questions. 

## 2018-06-04 ENCOUNTER — Ambulatory Visit (INDEPENDENT_AMBULATORY_CARE_PROVIDER_SITE_OTHER): Payer: Self-pay | Admitting: Orthopedic Surgery

## 2018-06-04 ENCOUNTER — Ambulatory Visit (INDEPENDENT_AMBULATORY_CARE_PROVIDER_SITE_OTHER): Payer: Self-pay

## 2018-06-04 ENCOUNTER — Telehealth: Payer: Self-pay | Admitting: *Deleted

## 2018-06-04 ENCOUNTER — Other Ambulatory Visit: Payer: Self-pay

## 2018-06-04 ENCOUNTER — Encounter (INDEPENDENT_AMBULATORY_CARE_PROVIDER_SITE_OTHER): Payer: Self-pay | Admitting: Orthopedic Surgery

## 2018-06-04 DIAGNOSIS — M79604 Pain in right leg: Secondary | ICD-10-CM

## 2018-06-04 NOTE — Progress Notes (Signed)
Office Visit Note   Patient: Rodney Lloyd           Date of Birth: Mar 24, 1970           MRN: [id number] Visit Date: 06/04/2018 Requested by: Rodney Neighbors, FNP [address] [place], [place] [address] PCP: Rodney Neighbors, FNP  Subjective: Chief Complaint  Patient presents with  . Right Knee - Follow-up    HPI: Rodney Lloyd is a patient who is here for evaluation of right knee pain.  Since he was seen last by the PA he has had good response to the knee injection but reports continued pain.  He had a heart attack in November and does need to walk for rehabilitation.  Does report some pain on the medial aspect of the knee radiating down to the knee and then below the knee and that has improved with the injection.  Does report some occasional mechanical symptoms in that right knee.  Hip range of motion is not discretely painful but he does report mild groin pain and mild posterior buttock pain on the right-hand side.              ROS: All systems reviewed are negative as they relate to the chief complaint within the history of present illness.  Patient denies  fevers or chills.   Assessment & Plan: Visit Diagnoses:  1. 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  Plan at this time is MRI scan of that knee to rule out meniscal pathology which potentially could restrict his rehabilitation following heart attack.  If that scan is not particularly revealing then I think a diagnostic and therapeutic injection into the hip joint would be the next step.  I will see him back after that study  Follow-Up Instructions: Return for after MRI.   Orders:  Orders Placed This Encounter  Procedures  . XR HIP UNILAT W OR W/O PELVIS 2-3 VIEWS RIGHT  . MR Knee Right w/o contrast   No orders of the defined types were placed in this encounter.     Procedures: No procedures performed   Clinical Data: No additional findings.  Objective: Vital Signs: There were no vitals taken for this visit.  Physical Exam:   Constitutional: Patient appears well-developed HEENT:  Head: Normocephalic Eyes:EOM are normal Neck: Normal range of motion Cardiovascular: Normal rate Pulmonary/chest: Effort normal Neurologic: Patient is alert Skin: Skin is warm Psychiatric: Patient has normal mood and affect    Ortho Exam: Ortho exam demonstrates slightly antalgic gait to the right but it does not look like a Trendelenburg gait.  No effusion in the right or left knee but he does have restricted internal rotation in both hips which is mildly painful.  Range of motion both knees is full.  Has a little bit more medial greater than lateral joint line tenderness in that right knee.  Mild patellofemoral crepitus is present.  Pedal pulses palpable.  Specialty Comments:  No specialty comments available.  Imaging: Xr Hip Unilat W Or W/o Pelvis 2-3 Views Right  Result Date: 06/04/2018 AP pelvis lateral right hip reviewed.  Spurring is present along the inferior femoral head consistent with mild to moderate arthritis.  Joint space narrowing is not severe.  Remainder bony pelvis normal.  These degenerative changes are present in both hips.  PMFS History: Patient Active Problem List   Diagnosis Date Noted  . Aspiration into airway   . Acute encephalopathy   . Cardiogenic shock (HCC)   . Cardiac arrest due to underlying cardiac condition (HCC) 01/03/2018  . Acute ST elevation myocardial infarction (STEMI) of anterolateral wall (HCC) 01/03/2018  . Cocaine abuse (HCC) 01/03/2018  . Cardiopulmonary resuscitation (CPR)-only resuscitation status   . Essential hypertension 02/15/2015  . Tobacco abuse 02/15/2015   Past Medical History:  Diagnosis Date  . Alcohol abuse   . CAD (coronary artery disease), native coronary artery    s/p PCI to LAD  01/2018  . Cocaine abuse (HCC)   . HLD (hyperlipidemia)   . Hypertension   . STEMI (ST elevation myocardial infarction) (HCC)   . Tobacco abuse     Family History  Problem Relation Age of Onset  . Hyperlipidemia Mother   . Heart disease Father     Past Surgical History:  Procedure Laterality Date  . CORONARY/GRAFT ACUTE MI REVASCULARIZATION N/A 01/03/2018   Procedure: Coronary/Graft Acute MI Revascularization;  Surgeon: Marykay Lex, MD;  Location: [facility] INVASIVE CV LAB;  Service: Cardiovascular;  Laterality: N/A;  . RIGHT/LEFT HEART CATH AND CORONARY ANGIOGRAPHY N/A 01/03/2018   Procedure: RIGHT/LEFT HEART CATH AND CORONARY ANGIOGRAPHY;  Surgeon: Marykay Lex, MD;  Location: [facility] INVASIVE CV LAB;  Service: Cardiovascular;  Laterality: N/A;   Social History   Occupational History  . Not on file  Tobacco Use  . Smoking status: Current Every Day Smoker    Packs/day: 0.10    Types: Cigarettes  . Smokeless tobacco: Never Used  Substance and Sexual Activity  . Alcohol use: Not Currently  . Drug use: Not Currently  . Sexual activity: Not on file

## 2018-06-04 NOTE — Telephone Encounter (Signed)
LEFT MESSAGE TO CALL BACK - NEED TO SCHEDULE AN E VISIT FOR 06/08/18

## 2018-06-04 NOTE — Addendum Note (Signed)
Addended byPrescott Parma on: 06/04/2018 12:59 PM   Modules accepted: Orders

## 2018-06-05 ENCOUNTER — Telehealth: Payer: Self-pay

## 2018-06-05 NOTE — Telephone Encounter (Signed)
Instruction for tele visit - telephone    TELEPHONE CALL NOTE  This patient has been deemed a candidate for follow-up tele-health visit to limit community exposure during the Covid-19 pandemic.   TA,CMA spoke with the patient via phone to discuss instructions. . The patient will receive a phone call 2-3 days prior to their E-Visit at which time consent will be verbally confirmed. A Virtual Office Visit appointment type has been scheduled for 11:40 am 4/6/20with DR [facility] - patient prefers TELEPHONEtype.  I have confirmed the patient DOES NOT HAVE active in MyChart . PATIENT DECLINE.  Tobin Chad, RN 06/05/2018 5:16 PM

## 2018-06-05 NOTE — Telephone Encounter (Signed)
Virtual Visit Pre-Appointment Phone Call  Steps For Call:  1. Confirm consent - "In the setting of the current Covid19 crisis, you are scheduled for a (phone or video) visit with your provider on (date) at (time).  Just as we do with many in-office visits, in order for you to participate in this visit, we must obtain consent.  If you'd like, I can send this to your mychart (if signed up) or email for you to review.  Otherwise, I can obtain your verbal consent now.  All virtual visits are billed to your insurance company just like a normal visit would be.  By agreeing to a virtual visit, we'd like you to understand that the technology does not allow for your provider to perform an examination, and thus may limit your provider's ability to fully assess your condition.  Finally, though the technology is pretty good, we cannot assure that it will always work on either your or our end, and in the setting of a video visit, we may have to convert it to a phone-only visit.  In either situation, we cannot ensure that we have a secure connection.  Are you willing to proceed?"  2. Give patient instructions for WebEx download to smartphone as below if video visit  3. Advise patient to be prepared with any vital sign or heart rhythm information, their current medicines, and a piece of paper and pen handy for any instructions they may receive the day of their visit  4. Inform patient they will receive a phone call 15 minutes prior to their appointment time (may be from unknown caller ID) so they should be prepared to answer  5. Confirm that appointment type is correct in Epic appointment notes (video vs telephone)    TELEPHONE CALL NOTE  Rodney Lloyd has been deemed a candidate for a follow-up tele-health visit to limit community exposure during the Covid-19 pandemic. I spoke with the patient via phone to ensure availability of phone/video source, confirm preferred email & phone number, and discuss  instructions and expectations.  I reminded Rodney Lloyd to be prepared with any vital sign and/or heart rhythm information that could potentially be obtained via home monitoring, at the time of his visit. I reminded Rodney Lloyd to expect a phone call at the time of his visit if his visit.  Did the patient verbally acknowledge consent to treatment? YES  Dorris Fetch, CMA 06/05/2018 4:12 PM   DOWNLOADING THE WEBEX SOFTWARE TO SMARTPHONE  - If Apple, go to [employer] and type in WebEx in the search bar. Download [employer], the blue/green circle. The app is free but as with any other app downloads, their phone may require them to verify saved payment information or Apple password. The patient does NOT have to create an account.  - If Android, ask patient to go to [employer] and type in WebEx in the search bar. Download [employer], the blue/green circle. The app is free but as with any other app downloads, their phone may require them to verify saved payment information or Android password. The patient does NOT have to create an account.   CONSENT FOR TELE-HEALTH VISIT - PLEASE REVIEW  I hereby voluntarily request, consent and authorize [facility] and its employed or contracted physicians, physician assistants, nurse practitioners or other licensed health care professionals (the Practitioner), to provide me with telemedicine health care services (the "Services") as deemed necessary by the treating Practitioner. I  acknowledge and consent to receive the Services by the Practitioner via telemedicine. I understand that the telemedicine visit will involve communicating with the Practitioner through live audiovisual communication technology and the disclosure of certain medical information by electronic transmission. I acknowledge that I have been given the opportunity to request an in-person assessment or other available alternative prior to the telemedicine visit and am  voluntarily participating in the telemedicine visit.  I understand that I have the right to withhold or withdraw my consent to the use of telemedicine in the course of my care at any time, without affecting my right to future care or treatment, and that the Practitioner or I may terminate the telemedicine visit at any time. I understand that I have the right to inspect all information obtained and/or recorded in the course of the telemedicine visit and may receive copies of available information for a reasonable fee.  I understand that some of the potential risks of receiving the Services via telemedicine include:  Marland Kitchen Delay or interruption in medical evaluation due to technological equipment failure or disruption; . Information transmitted may not be sufficient (e.g. poor resolution of images) to allow for appropriate medical decision making by the Practitioner; and/or  . In rare instances, security protocols could fail, causing a breach of personal health information.  Furthermore, I acknowledge that it is my responsibility to provide information about my medical history, conditions and care that is complete and accurate to the best of my ability. I acknowledge that Practitioner's advice, recommendations, and/or decision may be based on factors not within their control, such as incomplete or inaccurate data provided by me or distortions of diagnostic images or specimens that may result from electronic transmissions. I understand that the practice of medicine is not an exact science and that Practitioner makes no warranties or guarantees regarding treatment outcomes. I acknowledge that I will receive a copy of this consent concurrently upon execution via email to the email address I last provided but may also request a printed copy by calling the office of [place].    I understand that my insurance will be billed for this visit.   I have read or had this consent read to me. . I understand the  contents of this consent, which adequately explains the benefits and risks of the Services being provided via telemedicine.  . I have been provided ample opportunity to ask questions regarding this consent and the Services and have had my questions answered to my satisfaction. . I give my informed consent for the services to be provided through the use of telemedicine in my medical care  By participating in this telemedicine visit I agree to the above.

## 2018-06-05 NOTE — Telephone Encounter (Signed)
New message   Patient has changed his appt to a virtual visit on Monday and sent information over for the patient to get setup on my chart. Please call or send instructions to the patient.

## 2018-06-08 ENCOUNTER — Ambulatory Visit: Payer: No Typology Code available for payment source | Admitting: Cardiology

## 2018-06-08 ENCOUNTER — Telehealth (INDEPENDENT_AMBULATORY_CARE_PROVIDER_SITE_OTHER): Payer: No Typology Code available for payment source | Admitting: Cardiology

## 2018-06-08 ENCOUNTER — Encounter: Payer: Self-pay | Admitting: Cardiology

## 2018-06-08 ENCOUNTER — Other Ambulatory Visit: Payer: Self-pay

## 2018-06-08 VITALS — BP 142/91 | HR 71 | Ht 70.0 in

## 2018-06-08 DIAGNOSIS — Z9861 Coronary angioplasty status: Secondary | ICD-10-CM

## 2018-06-08 DIAGNOSIS — I462 Cardiac arrest due to underlying cardiac condition: Secondary | ICD-10-CM

## 2018-06-08 DIAGNOSIS — I2109 ST elevation (STEMI) myocardial infarction involving other coronary artery of anterior wall: Secondary | ICD-10-CM

## 2018-06-08 DIAGNOSIS — I10 Essential (primary) hypertension: Secondary | ICD-10-CM

## 2018-06-08 DIAGNOSIS — F1410 Cocaine abuse, uncomplicated: Secondary | ICD-10-CM

## 2018-06-08 DIAGNOSIS — R002 Palpitations: Secondary | ICD-10-CM | POA: Insufficient documentation

## 2018-06-08 DIAGNOSIS — Z720 Tobacco use: Secondary | ICD-10-CM

## 2018-06-08 DIAGNOSIS — I2510 Atherosclerotic heart disease of native coronary artery without angina pectoris: Secondary | ICD-10-CM

## 2018-06-08 DIAGNOSIS — E785 Hyperlipidemia, unspecified: Secondary | ICD-10-CM | POA: Insufficient documentation

## 2018-06-08 MED ORDER — CARVEDILOL 12.5 MG PO TABS: ORAL_TABLET | ORAL | 3 refills | Status: DC

## 2018-06-08 NOTE — Assessment & Plan Note (Signed)
Presented with VF arrest and anterior MI.  Ended up with PCI to the LAD.  No further anginal symptoms.  He still has a little bit of anxiety and PTSD type symptoms secondary to this event.  But this is caused him to have a pretty good life changing plan as far as changing his diet, he is now over 3 months sober/clean from a substance abuse standpoint.  He is cut down to maybe 1 or 2 cigarettes a day.  Thankfully, he has normal EF on echo and no heart failure or angina symptoms.  He is on stable regimen.  He did have short bursts of NSVT so we are increasing his carvedilol dosing.

## 2018-06-08 NOTE — Assessment & Plan Note (Signed)
Still going through with his ADS program.  Is now close to 90 days clean on this program, but was clean for the time of his discharge from the hospital.  He totally realizes the danger of ever using cocaine again.  He is also doing a really good job cutting back on cigarettes.

## 2018-06-08 NOTE — Assessment & Plan Note (Signed)
   On stable regimen of beta-blocker, ARB and statin. Continue DAPT  --preferably on amlodipine x1 year.

## 2018-06-08 NOTE — Progress Notes (Addendum)
Virtual Visit via Telephone Note   This visit type was conducted due to national recommendations for restrictions regarding the COVID-19 Pandemic (e.g. social distancing) in an effort to limit this patient's exposure and mitigate transmission in our community.  Due to his co-morbid illnesses, this patient is at least at moderate risk for complications without adequate follow up.  This format is felt to be most appropriate for this patient at this time.  The patient did not have access to video technology/had technical difficulties with video requiring transitioning to audio format only (telephone).  All issues noted in this document were discussed and addressed.  No physical exam could be performed with this format.  Please refer to the patient's chart for his  consent to telehealth for [facility].  Evaluation Performed:  Follow-up visit  This visit type was conducted due to national recommendations for restrictions regarding the COVID-19 Pandemic (e.g. social distancing).  This format is felt to be most appropriate for this patient at this time.  All issues noted in this document were discussed and addressed.  Due to his comorbid illnesses, this patient is felt to be at least at moderate risk without adequate follow up.  No physical exam was performed (except for noted visual exam findings with Video Visits).  Please refer to the patient's chart (MyChart message for video visits and phone note for telephone visits) for the patient's consent to telehealth for [facility].  Date:  06/08/2018   ID:  Rodney Lloyd, DOB 1970-09-16, MRN [id number]  Patient Location: Home  Provider Location: Home - [place], [place]  PCP:  Rodney Neighbors, FNP  Cardiologist:  Rodney Lemma, MD  Electrophysiologist:  None   Chief Complaint: 3-month follow-up  History of Present Illness:    Rodney Lloyd is a 48 y.o. male who presents via audio/video conferencing for a telehealth visit today.  This is his  first visit with me having had several follow-up visits with Rodney Reining, NP  Rodney Lloyd was admitted with witnessed VF arrest secondary to Anterior ST elevation MI November 2,2019 --> CPR with ROSC -- CATH: pLAD60%-ostD1 75% before 100% LAD --> DES PCI LAD across D1 w/ PTCA of D1.  65% pRI & mild-mod diffuse non-dom RCA. EF ~45-50% w/ Anterior HK. --> EF by Echo was 55 to 60% with anterior hypokinesis.   CODE COOL --> with ultimate neurologic recovery. (Cocaine +), HTN. --> prior to d/c, had a prolonged bradycardic event thought to be Vasovagal (during cough)  Clinic follow-up:  January 26, 2018 - Rodney Reining, DNP --> TOC f/u.  No med changes.  Sore from CPR. On Coreg & Losartan. - Out of work until seen in Jan.  March 05, 2018 - Rodney Reining, DNP --> noted feeling much better.  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  No chest pain.  Eating more healthy.  Down to 5 cigarettes a day.  April 21, 2018: Rodney Reining, DNP.  Try to go back to work, but could not tolerate it.  Was having right leg pain from the groin to the knee.  Concerned about blood clot (see LE Venous Duplex - no DVT).  Still noted significant anxiety related to his event.  Taking medications.  Continue to decrease smoking.  Mostly noted fluttering sensation and right leg pain.  Ordered venous Dopplers and event monitor.  Increased losartan to 50 mg for hypertension.  April 29, 2018: Rodney Reining, DNP - continued to have R knee  pain. Also PM palpitations - hard to sleep.   Checked knee XRay (normal - no fracture) & recommended f/u with PCP:    INTERVAL HISTORY: [name] is evaluated today via telephone. The patient does not have symptoms concerning for COVID-19 infection (fever, chills, cough, or new shortness of breath).  Doing pretty well - maybe 1 or 2 episodes of CP - associated with sneezing or coughing (allergies).  Able to mow the lawn etc & now starting back exercises around the  house.  Nothing strenuous yet.   Waiting to hear about monitor before back to work.  Fluttering has calmed down - but can still note with increased exercise or stress.   Cardiovascular ROS: no chest pain or dyspnea on exertion positive for - edema and palpitations negative for - edema, orthopnea, paroxysmal nocturnal dyspnea, shortness of breath or syncope/near syncope, TIA/amaurosis fugax.     Cigarettes - 1-2 /day (10 per week) unless gets upset. 90 Days ADS counseling - clean & happy.  Has changed diet & starting to exercise. 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    Review of Systems  Constitutional: Negative for chills, fever, malaise/fatigue and weight loss.  HENT: Negative for congestion and nosebleeds.   Respiratory: Negative for cough, shortness of breath and wheezing (pollen related).   Gastrointestinal: Negative for blood in stool, diarrhea, heartburn, melena, nausea and vomiting.  Genitourinary: Negative for dysuria and hematuria.  Musculoskeletal: Positive for joint pain (R knee pain - pending Ortho evaluation; painful with wlaking).  Neurological: Negative for dizziness.  Psychiatric/Behavioral: Negative for depression, memory loss and substance abuse (almost completed ADS program). The patient is nervous/anxious (still a bit anxious going to sleep).      Past Medical History:  Diagnosis Date  . Acute ST elevation myocardial infarction (STEMI) involving left anterior descending (LAD) coronary artery (HCC) 01/04/2019   Post VF Arrest (cocaine +) --> 100% p-mLAD after D1 (pLAD 60% & Ost Diag 75%) --> DES PCI to LAD & PTCA of ~small D1.  EF back up to 55-60% by Echo on d/c - distal anterior HK.  Marland Kitchen Alcohol abuse   . CAD (coronary artery disease), native coronary artery    s/p DES PCI to LAD 01/2018 (Synergy DES 3 x 28 -- 3.6) acoss D1 with PTCA of D1.   . Cocaine  abuse (HCC)   . HLD (hyperlipidemia)   . Hypertension   . Tobacco abuse    Past Surgical History:  Procedure Laterality Date  . CORONARY/GRAFT ACUTE MI REVASCULARIZATION N/A 01/03/2018   Procedure: Coronary/Graft Acute MI Revascularization;  Surgeon: Marykay Lex, MD;  Location: [facility] INVASIVE CV LAB; DES PCI pLAD: Synergy DES 3 x 38 (3.6) & PTCA of OstD1 (jailed)   . RIGHT/LEFT HEART CATH AND CORONARY ANGIOGRAPHY N/A 01/03/2018   Procedure: RIGHT/LEFT HEART CATH AND CORONARY ANGIOGRAPHY;  Surgeon: Marykay Lex, MD;  Location: [facility] INVAS[facility] CV LAB;  Service: Cardiovascular;;  (VF -> Ant STEMI): 100% mLAD, 80% 1st Diag & 70% Rmus Intermedius (RI) --> DES PCI pLAD & PTCA of D1.  EF~45% w/ dysentery anterior anterolateral HK.  Minimally Elevated LVEDP & PCWP & normal RHC pressures     Current Meds  Medication Sig  . aspirin 81 MG chewable tablet Chew 1 tablet (81 mg total) by mouth daily.  . cyclobenzaprine (FLEXERIL) 10 MG tablet Take 1 tablet (10 mg total) by  mouth 2 (two) times daily as needed for muscle spasms (causes drowsiness).  Marland Kitchen losartan (COZAAR) 50 MG tablet Take 1 tablet (50 mg total) by mouth daily.  . nitroGLYCERIN (NITROSTAT) 0.4 MG SL tablet Place 1 tablet (0.4 mg total) under the tongue every 5 (five) minutes as needed for chest pain.  . ticagrelor (BRILINTA) 90 MG TABS tablet Take 1 tablet (90 mg total) by mouth 2 (two) times daily.  . [DISCONTINUED] carvedilol (COREG) 12.5 MG tablet Take 1 tablet (12.5 mg total) by mouth 2 (two) times daily with a meal.     Allergies:   Patient has no known allergies.   Social History   Tobacco Use  . Smoking status: Current Every Day Smoker    Packs/day: 0.10    Types: Cigarettes  . Smokeless tobacco: Never Used  Substance Use Topics  . Alcohol use: Not Currently  . Drug use: Not Currently     Family Hx: The patient's family history includes Heart disease in his father; Hyperlipidemia in his mother.   Prior CV studies:   The  following studies were reviewed today:  R&L Heart Cath-PCI (VF -> Ant STEMI): 100% mLAD, 80% 1st Diag & 70% Rmus Intermedius (RI) --> DES PCI pLAD: Synergy DES 3 x 38 (3.6) & PTCA of OstD1 (jailed)  .  EF~45% w/ dysentery anterior anterolateral hypokinesis.  Minimally Elevated LVEDP & PCWP with normal RHC pressures   Echo January 04, 2018: Severe concentric LVH.  EF 55-60%.  Mild anterior apical hypokinesis.  GR 1 DD.  No LV thrombus.  Normal valves.  BLE Venous Duplex: No evidence of deep vein thrombosis in the right lower extremity.  No cystic structure in the popliteal fossa.  No evidence of left common femoral vein obstruction.   14-day ZIO patch monitor (05/2018):   Predominantly Sinus Rhythm  6 brief runs of NSVT (1 obvious NSVT - max rate 261 bpm x 9 beats), Longest run 10 beats - rate 100 bpm (similar to other 8 episodes that appear more c/w PAT with aberrnacy as opposed to other clear NSVT)  Wenkebach Block also present (2 total dropped P waves recorded)  Rare PVCs (some couplets & triplets noted) along with trivial PACs.  Max Sinus Tachycardia HR 125 bpm. Minimum ~33 - but noted to be with Wenkebach Block)   Patient log indicated that the brief NSVT runs are symptomatic. However, symptoms also noted with NSR  -- Will likely need to increase Beta Blocker depending on BP.  Labs/Other Tests and Data Reviewed:     EKG:  No ECG reviewed.  Recent Labs: 03/05/2018: ALT 26; Hemoglobin 13.1; Platelets 221 04/21/2018: BUN 12; Creatinine, Ser 1.02; Magnesium 2.1; Potassium 4.8; Sodium 144; TSH 1.410   Recent Lipid Panel Lab Results  Component Value Date   CHOL 124 03/05/2018   HDL 51 03/05/2018   LDLCALC 64 03/05/2018   TRIG 47 03/05/2018   CHOLHDL 2.4 03/05/2018   Lab Results  Component Value Date   CREATININE 1.02 04/21/2018   BUN 12 04/21/2018   NA 144 04/21/2018   K 4.8 04/21/2018   CL 103 04/21/2018   CO2 18 (L) 04/21/2018   Lab Results  Component Value Date   WBC  6.3 03/05/2018   HGB 13.1 03/05/2018   HCT 39.3 03/05/2018   MCV 88 03/05/2018   PLT 221 03/05/2018    Wt Readings from Last 3 Encounters:  05/07/18 251 lb 6.4 oz (114 kg)  04/29/18 249 lb (112.9 kg)  04/21/18 250 lb 12.8 oz (113.8 kg)     Objective:    Vital Signs:  BP (!) 142/91   Pulse 71   Ht 5\' 10"  (1.778 m)   BMI 36.07 kg/m   Telephonic evaluation Sounds good. NAD.  Normal respiratory effort.   ASSESSMENT & PLAN:    Problem List Items Addressed This Visit    CAD S/P percutaneous coronary angioplasty - Primary (Chronic)      Plan: Continue aspirin plus Brilinta with plans to continue for minimum 1 year.  Okay to hold aspirin for bleeding or bruising at this point, but not Brilinta.  Would be okay for urgent or emergent surgery still hold Brilinta after May 2020, however would prefer to avoid holding until November 2020.  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  EF essentially totally improved back to 55-60%.  Not unexpectedly the probably is still some anterior hypokinesis.  A little bit concerned about runs of NSVT --> will increase p.m. dose of carvedilol.  And reluctant to fully increase twice daily dosing because of his history of vasovagal bradycardic issue prior to discharge.      Relevant Medications   carvedilol (COREG) 12.5 MG tablet   Cocaine abuse (HCC) (Chronic)    Still going through with his ADS program.  Is now close to 90 days clean on this program, but was clean for the time of his discharge from the hospital.  He totally realizes the danger of ever using cocaine again.  He is also doing a really good job cutting back on cigarettes.      Coronary artery disease involving native coronary artery without angina pectoris (Chronic)      On  stable regimen of beta-blocker, ARB and statin. Continue DAPT  --preferably on amlodipine x1 year.      Relevant Medications   carvedilol (COREG) 12.5 MG tablet   Other Relevant Orders   Lipid panel   Essential hypertension (Chronic)    Recently had losartan increased to 50 mg. Plan for now is to increase carvedilol p.m. dose to 18.75 mg.      Relevant Medications   carvedilol (COREG) 12.5 MG tablet   h/o Acute ST elevation myocardial infarction (STEMI) of anterolateral wall (HCC) (Chronic)    Presented with VF arrest and anterior MI.  Ended up with PCI to the LAD.  No further anginal symptoms.  He still has a little bit of anxiety and PTSD type symptoms secondary to this event.  But this is caused him to have a pretty good life changing plan as far as changing his diet, he is now over 3 months sober/clean from a substance abuse standpoint.  He is cut down to maybe 1 or 2 cigarettes a day.  Thankfully, he has normal EF on echo and no heart failure or angina symptoms.  He is on stable regimen.  He did have short bursts of NSVT so we are increasing his carvedilol dosing.      Relevant Medications   carvedilol (COREG) 12.5 MG tablet   Hyperlipidemia with target low density lipoprotein (LDL) cholesterol less than 70 mg/dL (Chronic)    Currently well-controlled on current dose of atorvastatin.  Due for recheck labs in July.  Continue current dose.  Would like to  see LDL less than 50.      Relevant Medications   carvedilol (COREG) 12.5 MG tablet   Other Relevant Orders   Lipid panel   Comprehensive metabolic panel   Palpitations    14-day event monitor reviewed.  Rare PACs and PVCs noted, however there were several short runs of NSVT less than 15 beats.  Most of these episodes seem to be bothering him at night.  We will plan to increase his p.m. dose of carvedilol to 18.75 mg.  Continue morning dose at 12.5 mg.      Relevant Orders   Comprehensive metabolic panel   Tobacco abuse  (Chronic)    Has cut down to 1 or 2 cigarettes a day.  Still using it is a little bit of a buffer for anxiety.  Hopefully once he starts exercising he will finally be eloquent.  The goal is for him to try to stop completely by time I see him back.  < 3 min spent.  Smoking cessation instruction/counseling given:  counseled patient on the dangers of tobacco use, advised patient to stop smoking, and reviewed strategies to maximize success         COVID-19 Education: The signs and symptoms of COVID-19 were discussed with the patient and how to seek care for testing (follow up with PCP or arrange E-visit).  The importance of social distancing was discussed today.  Time:   Today, I have spent 27 minutes with the patient with telehealth technology discussing the above problems.    Medication Adjustments/Labs and Tests Ordered: Current medicines are reviewed at length with the patient today.  Concerns regarding medicines are outlined above.  Tests Ordered: Orders Placed This Encounter  Procedures  . Lipid panel  . Comprehensive metabolic panel   Medication Changes: Meds ordered this encounter  Medications  . carvedilol (COREG) 12.5 MG tablet    Sig: Take 1 tablet (12.5 mg total) by mouth every morning AND 1.5 tablets (18.75 mg total) at bedtime.    Dispense:  225 tablet    Refill:  3    Dose change ,please discontinue current dosage 12.5 mg twice a day    Disposition:  Follow up in 4 month(s)  Signed, Rodney Lemma, MD  06/08/2018 12:42 PM    [facility]

## 2018-06-08 NOTE — Assessment & Plan Note (Signed)
14-day event monitor reviewed.  Rare PACs and PVCs noted, however there were several short runs of NSVT less than 15 beats.  Most of these episodes seem to be bothering him at night.  We will plan to increase his p.m. dose of carvedilol to 18.75 mg.  Continue morning dose at 12.5 mg.

## 2018-06-08 NOTE — Assessment & Plan Note (Signed)
Currently well-controlled on current dose of atorvastatin.  Due for recheck labs in July.  Continue current dose.  Would like to see LDL less than 50.

## 2018-06-08 NOTE — Patient Instructions (Addendum)
Medication Instructions:  Plan for now - increase Carvedilol PM dose to 18.75 mg. Continue 12.5 mg in AM. Will send a new prescription into the pharmacy with new directions. If you need a refill on your cardiac medications before your next appointment, please call your pharmacy.    Lab work: Need CMP & Lipids before that visit.  Will mail you labslip closer to the time.  If you have labs (blood work) drawn today and your tests are completely normal, you will receive your results only by: Marland Kitchen MyChart Message (if you have MyChart) OR . A paper copy in the mail If you have any lab test that is abnormal or we need to change your treatment, we will call you to review the results.  Testing/Procedures:  Not needed  Follow-Up: At [facility], you and your health needs are our priority.  As part of our continuing mission to provide you with exceptional heart care, we have created designated Provider Care Teams.  These Care Teams include your primary Cardiologist (physician) and Advanced Practice Providers (APPs -  Physician Assistants and Nurse Practitioners) who all work together to provide you with the care you need, when you need it. You will need a follow up appointment in 4 months  (push back next visit to Aug-Sept).  Please call our office 2 months in advance to schedule this appointment.  You may see Bryan Lemma, MD or one of the following Advanced Practice Providers on your designated Care Team:   Theodore Demark, PA-C . Joni Reining, DNP, ANP  Any Other Special Instructions Will Be Listed Below (If Applicable).

## 2018-06-08 NOTE — Assessment & Plan Note (Addendum)
VF arrest in the setting of an anterior STEMI.  EF essentially totally improved back to 55-60%.  Not unexpectedly the probably is still some anterior hypokinesis.  A little bit concerned about runs of NSVT --> will increase p.m. dose of carvedilol.  And reluctant to fully increase twice daily dosing because of his history of vasovagal bradycardic issue prior to discharge.

## 2018-06-08 NOTE — Assessment & Plan Note (Signed)
   Plan: Continue aspirin plus Brilinta with plans to continue for minimum 1 year.  Okay to hold aspirin for bleeding or bruising at this point, but not Brilinta.  Would be okay for urgent or emergent surgery still hold Brilinta after May 2020, however would prefer to avoid holding until November 2020.  Continue ARB and beta-blocker  Continue current dose of statin

## 2018-06-08 NOTE — Assessment & Plan Note (Signed)
Recently had losartan increased to 50 mg. Plan for now is to increase carvedilol p.m. dose to 18.75 mg.

## 2018-06-08 NOTE — Assessment & Plan Note (Signed)
Has cut down to 1 or 2 cigarettes a day.  Still using it is a little bit of a buffer for anxiety.  Hopefully once he starts exercising he will finally be eloquent.  The goal is for him to try to stop completely by time I see him back.  < 3 min spent.  Smoking cessation instruction/counseling given:  counseled patient on the dangers of tobacco use, advised patient to stop smoking, and reviewed strategies to maximize success

## 2018-06-09 ENCOUNTER — Other Ambulatory Visit: Payer: Self-pay | Admitting: Cardiology

## 2018-06-09 ENCOUNTER — Telehealth: Payer: Self-pay | Admitting: Cardiology

## 2018-06-09 MED ORDER — LOSARTAN POTASSIUM 50 MG PO TABS: 50.0000 mg | ORAL_TABLET | Freq: Every day | ORAL | 1 refills | Status: DC

## 2018-06-09 MED ORDER — TICAGRELOR 90 MG PO TABS: 90.0000 mg | ORAL_TABLET | Freq: Two times a day (BID) | ORAL | 1 refills | Status: DC

## 2018-06-09 NOTE — Telephone Encounter (Signed)
Refill flexeril.

## 2018-06-09 NOTE — Telephone Encounter (Signed)
° ° ° °*  STAT* If patient is at the pharmacy, call can be transferred to refill team.   1. Which medications need to be refilled? (please list name of each medication and dose if known) losartan (COZAAR) 50 MG tablet  2. Which pharmacy/location (including street and city if local pharmacy) is medication to be sent to? [facility], [address] [place]  3. Do they need a 30 day or 90 day supply? 30

## 2018-06-09 NOTE — Telephone Encounter (Signed)
New Message    *STAT* If patient is at the pharmacy, call can be transferred to refill team.   1. Which medications need to be refilled? (please list name of each medication and dose if known) losartan (COZAAR) 50 MG tablet, ticagrelor (BRILINTA) 90 MG TABS tablet and cyclobenzaprine (FLEXERIL) 10 MG tablet      2. Which pharmacy/location (including street and city if local pharmacy) is medication to be sent to? [facility] - [place], [place] - [address]  3. Do they need a 30 day or 90 day supply? 90

## 2018-06-09 NOTE — Telephone Encounter (Signed)
Sure -- as long as he knows that he cannot drive with it.  Bryan Lemma, MD

## 2018-06-10 ENCOUNTER — Telehealth: Payer: Self-pay | Admitting: Cardiology

## 2018-06-10 MED ORDER — CYCLOBENZAPRINE HCL 10 MG PO TABS: 10.0000 mg | ORAL_TABLET | Freq: Two times a day (BID) | ORAL | 1 refills | Status: DC | PRN

## 2018-06-10 MED FILL — CYCLOBENZAPRINE 10 MG TAB: 10 | 30 days supply | Qty: 60 | Fill #0

## 2018-06-10 MED FILL — $BRILINTA 90 MG TABLET: 90 | 90 days supply | Qty: 180 | Fill #3 | Status: TO

## 2018-06-10 MED FILL — LOSARTAN POTASSIUM 25 MG TA: 25 | 90 days supply | Qty: 90 | Fill #3 | Status: TO

## 2018-06-10 MED FILL — ?CARVEDILOL 12.5 MG TABLET: 12.5 | 90 days supply | Qty: 225 | Fill #0 | Status: TO

## 2018-06-10 NOTE — Telephone Encounter (Signed)
Follow up:    Patient calling concerning about some medications. Please call patient back.

## 2018-06-10 NOTE — Telephone Encounter (Signed)
Spoke with the pt. Pt sts that he is heading out to [facility] pharmacy to pick up his medications. He is calling to make sure that his medication refills have been sent to his pharmacy. Adv that Carvedilol, Brilinta, Losartan, Flexeril (adv pt not to drive while taking this med) have been [place]. Pt verbalized understanding and voiced appreciation for the call.

## 2018-06-10 NOTE — Telephone Encounter (Signed)
Pt informed of providers result & recommendations. Pt verbalized understanding. He will not drive while taking the flexeril.

## 2018-06-11 ENCOUNTER — Telehealth: Payer: Self-pay | Admitting: Cardiology

## 2018-06-11 NOTE — Telephone Encounter (Signed)
Spoke with pt who states that he had Rx's for Brilinta, losartan, and carvedilol sent to pharmacy on file [facility] but because he will be receiving meds through mail he needs advise on what to do. Advised pt that a few tabs of each med can be sent over to a pharm near him for pickup but he stated that he tried to go to [facility] and they do not accept his 'orange card'.   Consulted supervisor Thane Edu, RN who advised to tell pt to try calling phone number on orange card for info on pharmacies that accept orange card and/or call different pharmacies to see if they accept orange card. Relayed info to pt and advised pt to call office to update. Pt verbalized understanding and is agreeable with this.

## 2018-06-11 NOTE — Telephone Encounter (Signed)
Pt called office and informed scheduler that he was able to get his medication.

## 2018-06-11 NOTE — Telephone Encounter (Signed)
Encounter not needed

## 2018-06-11 NOTE — Telephone Encounter (Signed)
  Patient is having to have his medications mailed to him and he will be without his medications for about 3 to 4 days before he will receive it. He would like to know if he will be okay to go without?

## 2018-06-11 NOTE — Telephone Encounter (Signed)
New Message   Patient states [facility]  Just a courtesy call to let everyone know he doesn't need anything else at the moment.

## 2018-07-15 ENCOUNTER — Telehealth: Payer: Self-pay | Admitting: Family Medicine

## 2018-07-15 NOTE — Telephone Encounter (Signed)
Patient is requesting a call back from the nurse, patient states that he was on a medication that is not working for him, please follow up

## 2018-07-16 NOTE — Telephone Encounter (Signed)
Left voice mail to call back 

## 2018-07-20 NOTE — Telephone Encounter (Signed)
Patient called back and stated that during his last visit he was prescribed something for his erectile dysfunction and that it is not working for him, please follow up.

## 2018-07-20 NOTE — Telephone Encounter (Signed)
Medication management adjustments will require an visit.  Please schedule patient for a tele-health visit.

## 2018-07-20 NOTE — Telephone Encounter (Signed)
Patient scheduled appointment.

## 2018-07-20 NOTE — Telephone Encounter (Signed)
Patient is referencing the Tadalafil 2.5 mg tablet

## 2018-07-23 ENCOUNTER — Ambulatory Visit (INDEPENDENT_AMBULATORY_CARE_PROVIDER_SITE_OTHER): Payer: Self-pay | Admitting: Family Medicine

## 2018-07-23 ENCOUNTER — Other Ambulatory Visit: Payer: Self-pay

## 2018-07-23 DIAGNOSIS — N529 Male erectile dysfunction, unspecified: Secondary | ICD-10-CM

## 2018-07-23 MED ORDER — TADALAFIL 5 MG PO TABS: 5.0000 mg | ORAL_TABLET | Freq: Every day | ORAL | 2 refills | Status: DC

## 2018-07-23 MED FILL — TADALAFIL 5 MG TABS: 5 | 30 days supply | Qty: 30 | Fill #0

## 2018-07-23 NOTE — Progress Notes (Deleted)
Called patient to initiate their telephone visit with provider Joaquin Courts, FNP-C. Verified date of birth. Patient states that he tried the Tadalafil for about 2 weeks but didn't notice any improvement. KWalker, CMA.

## 2018-07-23 NOTE — Progress Notes (Signed)
Virtual Visit via Telephone Note  I connected with Rodney Lloyd on 07/23/18 at  9:30 AM EDT by telephone and verified that I am speaking with the correct person using two identifiers.  Location: Patient: Located at home during today's encounter.Provider: Located at primary care office.     I discussed the limitations, risks, security and privacy concerns of performing an evaluation and management service by telephone and the availability of in person appointments. I also discussed with the patient that there may be a patient responsible charge related to this service. The patient expressed understanding and agreed to proceed.   History of Present Illness: Rodney Lloyd suffers from erectile dysfunction.ED symptoms began after patient was hospitalized after a cardiac arrest secondary to STEMI. He was found to have coronary artery disease and was started on cardiac medications for the first time in his life. He reports since these events sexual activity has progressively declined. He was last seen in office on 05/07/18, and was started on a low dose of Taladafil 2.5 mg once daily. He reports tolerating the medication although, he never noticed any improvement in his ability to achieve an erection. He never followed-up to have medication titrated. He also now has the orange card and is willing to see urology if medication options remain limited. He continues to remain free of chest pain or shortness of breath. He is able to tolerate physical activities and is currently working as a Administrator. Denies any use of nitroglycerin. Assessment and Plan: 1. Erectile dysfunction, unspecified erectile dysfunction type -Trial daily Cialis 5 mg (increased dose from 2.5), in efforts to improve ED symptoms. Will proceed with referral to urology for second opinion. - Ambulatory referral to Urology  Follow Up Instructions:    I discussed the assessment and treatment plan with the patient. The patient was provided an  opportunity to ask questions and all were answered. The patient agreed with the plan and demonstrated an understanding of the instructions.   The patient was advised to call back or seek an in-person evaluation if the symptoms worsen or if the condition fails to improve as anticipated.  I provided 20 minutes of non-face-to-face time during this encounter.   Joaquin Courts, FNP

## 2018-08-05 ENCOUNTER — Ambulatory Visit
Admission: RE | Admit: 2018-08-05 | Discharge: 2018-08-05 | Disposition: A | Discharge status: Home / Self Care | Payer: No Typology Code available for payment source | Source: Ambulatory Visit | Attending: Orthopedic Surgery | Admitting: Orthopedic Surgery

## 2018-08-05 ENCOUNTER — Other Ambulatory Visit: Payer: Self-pay

## 2018-08-05 DIAGNOSIS — M79604 Pain in right leg: Secondary | ICD-10-CM

## 2018-09-01 ENCOUNTER — Telehealth: Payer: Self-pay | Admitting: Cardiology

## 2018-09-01 ENCOUNTER — Telehealth: Payer: Self-pay | Admitting: Family Medicine

## 2018-09-01 ENCOUNTER — Ambulatory Visit: Payer: Self-pay | Admitting: Family Medicine

## 2018-09-01 ENCOUNTER — Other Ambulatory Visit: Payer: Self-pay

## 2018-09-01 ENCOUNTER — Ambulatory Visit (INDEPENDENT_AMBULATORY_CARE_PROVIDER_SITE_OTHER): Payer: Self-pay | Admitting: Family Medicine

## 2018-09-01 ENCOUNTER — Encounter: Payer: Self-pay | Admitting: Family Medicine

## 2018-09-01 DIAGNOSIS — N529 Male erectile dysfunction, unspecified: Secondary | ICD-10-CM

## 2018-09-01 DIAGNOSIS — Z720 Tobacco use: Secondary | ICD-10-CM

## 2018-09-01 DIAGNOSIS — Z760 Encounter for issue of repeat prescription: Secondary | ICD-10-CM

## 2018-09-01 DIAGNOSIS — I2510 Atherosclerotic heart disease of native coronary artery without angina pectoris: Secondary | ICD-10-CM

## 2018-09-01 DIAGNOSIS — F1410 Cocaine abuse, uncomplicated: Secondary | ICD-10-CM

## 2018-09-01 MED ORDER — LOSARTAN POTASSIUM 50 MG PO TABS: 50.0000 mg | ORAL_TABLET | Freq: Every day | ORAL | 3 refills | Status: DC

## 2018-09-01 MED ORDER — TADALAFIL 5 MG PO TABS: 5.0000 mg | ORAL_TABLET | Freq: Every day | ORAL | 2 refills | Status: DC

## 2018-09-01 MED ORDER — TICAGRELOR 90 MG PO TABS: 90.0000 mg | ORAL_TABLET | Freq: Two times a day (BID) | ORAL | 1 refills | Status: DC

## 2018-09-01 MED ORDER — CARVEDILOL 12.5 MG PO TABS: ORAL_TABLET | ORAL | 3 refills | Status: DC

## 2018-09-01 MED FILL — ?CARVEDILOL 12.5MG TABLET: 12.5 | 30 days supply | Qty: 75 | Fill #0

## 2018-09-01 MED FILL — $BRILINTA 90 MG TABLET: 90 | 90 days supply | Qty: 180 | Fill #0

## 2018-09-01 MED FILL — LOSARTAN POTASSIUM 50 MG TA: 50 | 30 days supply | Qty: 30 | Fill #0

## 2018-09-01 NOTE — Telephone Encounter (Signed)
Letter mailed to patient.

## 2018-09-01 NOTE — Telephone Encounter (Signed)
New Message             *STAT* If patient is at the pharmacy, call can be transferred to refill team.   1. Which medications need to be refilled? (please list name of each medication and dose if known) Carvdilol/Cyclobenzaprine/Losartan/Brilinta  2. Which pharmacy/location (including street and city if local pharmacy) is medication to be sent to?[place]  3. Do they need a 30 day or 90 day supply? 90 Patient would like the Rx's mailed

## 2018-09-01 NOTE — Telephone Encounter (Signed)
Spoke with pt, losartan, carvedilol and brilinta refills sent to the pharmacy. Aware cyclobenzaprine is not usually something we fill but will ask dr harding. Also the patient needs a letter for work stating he can not be around anyone due to his decreased immune system. He reports he was told thid by dr harding, he would like the letter mailed to him. Will forward to dr harding to review and advise.

## 2018-09-01 NOTE — Progress Notes (Signed)
Virtual Visit via Telephone Note  I connected with Rodney Lloyd on 09/01/18 at  2:30 PM EDT by telephone and verified that I am speaking with the correct person using two identifiers.  Location: Patient: Located at home during today's encounter  Provider: Located at primary care office    I discussed the limitations, risks, security and privacy concerns of performing an evaluation and management service by telephone and the availability of in person appointments. I also discussed with the patient that there may be a patient responsible charge related to this service. The patient expressed understanding and agreed to proceed.   History of Present Illness: Rodney Lloyd is present for today's telemedicine encounter to follow-up on erectile dysfunction.  Patient was recently started on Cialis initially 2.5 mg for management of ED related symptoms. Cialis was titrated to 5 mg on 07/23/2018 as patient was not achieving desired effect at the 2.5 dose. He reports today that the medication has been effective in improving his ED symptoms. He was also referred to urology and has a follow-up scheduled next week.  He continues to be followed by cardiology for coronary artery disease.  He denies any chest pain.  He is also requesting a letter today documenting that he is high risk for COVID-19 in order for him to present to employers. 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  Assessment and Plan: 1. Medication refill 2. Erectile dysfunction, unspecified erectile dysfunction type -tadalafil (CIALIS) 5 MG refilled  -Keep follow-up appointment with urology.   3. Tobacco abuse -Congratulated patient on quitting smoking.  -Continue nicotine patches  4. Cocaine abuse ([place]) -Currently in a 90 days treatment program   5. Coronary artery disease involving native coronary artery of native heart without angina  pectoris -Followed closely by cardiology. Patient is S/P cardiac arrest and MI  Follow Up Instructions: Patient will schedule    I discussed the assessment and treatment plan with the patient. The patient was provided an opportunity to ask questions and all were answered. The patient agreed with the plan and demonstrated an understanding of the instructions.   The patient was advised to call back or seek an in-person evaluation if the symptoms worsen or if the condition fails to improve as anticipated.  I provided 25 minutes of non-face-to-face time during this encounter.   Molli Barrows, FNP

## 2018-09-02 MED FILL — !CIALIS 5 MG TABLET: 5 | 30 days supply | Qty: 10 | Fill #0

## 2018-09-05 NOTE — Telephone Encounter (Signed)
Would prefer to leave cyclobenzaprine Rx up to PCP>  Glenetta Hew, MD

## 2018-09-08 NOTE — Telephone Encounter (Signed)
LEFT  DETAILED MESSAGE - PLEASE HAVE PRIMARY TO FILL MEDICATION ( CYCLOBENZAPRINE) PER DR HARDING

## 2018-09-09 ENCOUNTER — Ambulatory Visit (INDEPENDENT_AMBULATORY_CARE_PROVIDER_SITE_OTHER): Payer: No Typology Code available for payment source | Admitting: Urology

## 2018-09-09 DIAGNOSIS — N5201 Erectile dysfunction due to arterial insufficiency: Secondary | ICD-10-CM

## 2018-09-25 MED FILL — TADALAFIL 5 MG TABS: 5 | 30 days supply | Qty: 30 | Fill #1

## 2018-10-05 ENCOUNTER — Other Ambulatory Visit: Payer: Self-pay | Admitting: Cardiology

## 2018-10-05 MED ORDER — CARVEDILOL 12.5 MG PO TABS: ORAL_TABLET | ORAL | 3 refills | Status: DC

## 2018-10-05 MED FILL — ?CARVEDILOL 12.5 MG TABLET: 12.5 | 30 days supply | Qty: 75 | Fill #0

## 2018-10-05 NOTE — Telephone Encounter (Signed)
Pt's medication was resent to pt's pharmacy as requested. Confirmation received.  °

## 2018-10-09 ENCOUNTER — Telehealth: Payer: Self-pay | Admitting: *Deleted

## 2018-10-09 DIAGNOSIS — R002 Palpitations: Secondary | ICD-10-CM

## 2018-10-09 DIAGNOSIS — I2510 Atherosclerotic heart disease of native coronary artery without angina pectoris: Secondary | ICD-10-CM

## 2018-10-09 DIAGNOSIS — E785 Hyperlipidemia, unspecified: Secondary | ICD-10-CM

## 2018-10-09 MED FILL — LOSARTAN POTASSIUM 50 MG TA: 50 | 30 days supply | Qty: 30 | Fill #1

## 2018-10-09 NOTE — Telephone Encounter (Signed)
MAILED LETTER AND LABSLIP due by 11/02/18

## 2018-10-09 NOTE — Telephone Encounter (Signed)
-----   Message from Raiford Simmonds, RN sent at 06/08/2018 12:42 PM EDT ----- Need labs done July/aug 2020 Will mail in @ July 6,2020 cmp /lipid

## 2018-10-12 ENCOUNTER — Ambulatory Visit: Payer: Self-pay | Attending: Family Medicine

## 2018-10-12 ENCOUNTER — Ambulatory Visit: Payer: Self-pay

## 2018-10-12 ENCOUNTER — Other Ambulatory Visit: Payer: Self-pay

## 2018-10-14 ENCOUNTER — Telehealth: Payer: Self-pay | Admitting: Cardiology

## 2018-10-14 ENCOUNTER — Other Ambulatory Visit: Payer: Self-pay

## 2018-10-14 ENCOUNTER — Ambulatory Visit (INDEPENDENT_AMBULATORY_CARE_PROVIDER_SITE_OTHER): Payer: Self-pay | Admitting: Urology

## 2018-10-14 DIAGNOSIS — N5201 Erectile dysfunction due to arterial insufficiency: Secondary | ICD-10-CM

## 2018-10-14 NOTE — Telephone Encounter (Signed)
New Message    Patient states Dr. Nicolette Bang has been emailing Dr. Ellyn Hack regarding patient's medication for erectile dysfunction.

## 2018-10-15 NOTE — Telephone Encounter (Signed)
See below . Thanks

## 2018-10-21 ENCOUNTER — Telehealth: Payer: Self-pay | Admitting: *Deleted

## 2018-10-21 NOTE — Telephone Encounter (Signed)
called  Spoke to triage - per  Dr Ellyn Hack orders -  Received  Office note requesting the use of Cialis from Dr Nicolette Bang   Per Dr Ellyn Hack - ok to use  Cialis  Provide not using prn NTG. Information will be  Given to Dr Alyson Ingles.

## 2018-10-21 NOTE — Telephone Encounter (Signed)
[name] does not have mychart account that I could see, he will need to open account should he CB and need to call [phone] is mychart IT

## 2018-10-21 NOTE — Telephone Encounter (Signed)
??   What is the question?  Glenetta Hew, MD

## 2018-10-21 NOTE — Telephone Encounter (Signed)
called Dr Artist Pais OFFICE in regards to patient using Cialis spoke to triage - message will be relayed.  Per Dr Candie Echevaria to use Cialis  If not using prn NTG.Marland Kitchen

## 2018-10-23 LAB — COMPREHENSIVE METABOLIC PANEL
ALT: 15 IU/L (ref 0–44)
AST: 19 IU/L (ref 0–40)
Albumin/Globulin Ratio: 1.9 (ref 1.2–2.2)
Albumin: 4.5 g/dL (ref 4.0–5.0)
Alkaline Phosphatase: 74 IU/L (ref 39–117)
BUN/Creatinine Ratio: 18 (ref 9–20)
BUN: 18 mg/dL (ref 6–24)
Bilirubin Total: 0.3 mg/dL (ref 0.0–1.2)
CO2: 21 mmol/L (ref 20–29)
Calcium: 9.1 mg/dL (ref 8.7–10.2)
Chloride: 104 mmol/L (ref 96–106)
Creatinine, Ser: 1.01 mg/dL (ref 0.76–1.27)
GFR calc Af Amer: 101 mL/min/{1.73_m2} (ref >59)
GFR calc non Af Amer: 88 mL/min/{1.73_m2} (ref >59)
Globulin, Total: 2.4 g/dL (ref 1.5–4.5)
Glucose: 101 mg/dL — ABNORMAL HIGH (ref 65–99)
Potassium: 4.7 mmol/L (ref 3.5–5.2)
Sodium: 141 mmol/L (ref 134–144)
Total Protein: 6.9 g/dL (ref 6.0–8.5)

## 2018-10-23 LAB — LIPID PANEL
Chol/HDL Ratio: 5.1 ratio — ABNORMAL HIGH (ref 0.0–5.0)
Cholesterol, Total: 193 mg/dL (ref 100–199)
HDL: 38 mg/dL — ABNORMAL LOW (ref >39)
LDL Calculated: 131 mg/dL — ABNORMAL HIGH (ref 0–99)
Triglycerides: 121 mg/dL (ref 0–149)
VLDL Cholesterol Cal: 24 mg/dL (ref 5–40)

## 2018-10-28 ENCOUNTER — Telehealth: Payer: Self-pay | Admitting: *Deleted

## 2018-10-28 MED ORDER — CARVEDILOL 12.5 MG PO TABS: ORAL_TABLET | ORAL | 3 refills | Status: DC

## 2018-10-28 MED ORDER — ROSUVASTATIN CALCIUM 40 MG PO TABS: 40.0000 mg | ORAL_TABLET | Freq: Every day | ORAL | 3 refills | Status: DC

## 2018-10-28 MED FILL — ROSUVASTATIN CALCIUM 40 MG: 40 | 90 days supply | Qty: 90 | Fill #0

## 2018-10-28 MED FILL — CARVEDILOL 12.5 MG TABLET: 12.5 | 90 days supply | Qty: 225 | Fill #0

## 2018-10-28 NOTE — Telephone Encounter (Signed)
The patient has been notified of the result and verbalized understanding.  All questions (if any) were answered. Raiford Simmonds, RN 10/28/2018 11:16 AM  Patient states he does not think he is  taking atorvastatin at present ( he thins he stooped because of  Muscle issues  patient aware to  Start rosuvastatin  40 mg at bedtime

## 2018-10-28 NOTE — Telephone Encounter (Signed)
-----   Message from Leonie Man, MD sent at 10/26/2018  7:32 PM EDT ----- Lipid panel looks much worse than it has been.  I am not sure what is going on.  Looks like he is not taking atorvastatin.  LDL went up from 64 which is goal 234.  Toe pressure went from 124 up to 193 and HDL went down from 51-38. We should restart atorvastatin 40 mg daily -> if he did not tolerate atorvastatin, then we can switch to rosuvastatin 40 mg daily.  Rx atorvastatin 40 mg p.o. daily.  Dispense 90 tabs with 3 refills.  (If patient indicates that he did not tolerate atorvastatin, changes from atorvastatin to rosuvastatin same dose)  Other labs look relatively normal.  Glucose levels are just borderline.  Need to follow closely.  Otherwise normal kidney and liver function.  Glenetta Hew, MD

## 2018-10-29 ENCOUNTER — Telehealth: Payer: Self-pay | Admitting: Family Medicine

## 2018-10-29 NOTE — Telephone Encounter (Signed)
Pt aware.

## 2018-10-29 NOTE — Telephone Encounter (Signed)
Patient was referred to [facility](ph [phone]) for ED. He should contact them for further refills.

## 2018-10-29 NOTE — Telephone Encounter (Signed)
1) Medication(s) Requested (by name): -tadalafil (CIALIS) 5 MG tablet   2) Pharmacy of Choice: -[facility], [place] 3) Special Requests:   Approved medications will be sent to the pharmacy, we will reach out if there is an issue.  Requests made after 3pm may not be addressed until the following business day!

## 2018-11-11 ENCOUNTER — Telehealth (INDEPENDENT_AMBULATORY_CARE_PROVIDER_SITE_OTHER): Payer: Self-pay | Admitting: Cardiology

## 2018-11-11 ENCOUNTER — Telehealth: Payer: Self-pay | Admitting: *Deleted

## 2018-11-11 ENCOUNTER — Encounter: Payer: Self-pay | Admitting: Cardiology

## 2018-11-11 VITALS — BP 148/100 | HR 64 | Ht 70.0 in | Wt 260.0 lb

## 2018-11-11 DIAGNOSIS — E785 Hyperlipidemia, unspecified: Secondary | ICD-10-CM

## 2018-11-11 DIAGNOSIS — I10 Essential (primary) hypertension: Secondary | ICD-10-CM

## 2018-11-11 DIAGNOSIS — N529 Male erectile dysfunction, unspecified: Secondary | ICD-10-CM | POA: Insufficient documentation

## 2018-11-11 DIAGNOSIS — I2510 Atherosclerotic heart disease of native coronary artery without angina pectoris: Secondary | ICD-10-CM

## 2018-11-11 DIAGNOSIS — Z720 Tobacco use: Secondary | ICD-10-CM

## 2018-11-11 DIAGNOSIS — Z955 Presence of coronary angioplasty implant and graft: Secondary | ICD-10-CM

## 2018-11-11 DIAGNOSIS — F1410 Cocaine abuse, uncomplicated: Secondary | ICD-10-CM

## 2018-11-11 DIAGNOSIS — R002 Palpitations: Secondary | ICD-10-CM

## 2018-11-11 MED ORDER — TICAGRELOR 90 MG PO TABS: 90.0000 mg | ORAL_TABLET | Freq: Two times a day (BID) | ORAL | 3 refills | Status: DC

## 2018-11-11 MED ORDER — LOSARTAN POTASSIUM 100 MG PO TABS: 100.0000 mg | ORAL_TABLET | Freq: Every day | ORAL | 3 refills | Status: DC

## 2018-11-11 MED FILL — LOSARTAN POTASSIUM 100 MG T: 100 | 30 days supply | Qty: 30 | Fill #0

## 2018-11-11 NOTE — Patient Instructions (Addendum)
Medication Instructions:    We are going to increase your dose of Losartan to 100 mg daily (take 2 of your current 50 mg tabs). Rx: Losartan 100 mg PO daily.  Disp # 9. Refill 4  Continue Taking the new cholesterol medication --> rosuvastatin 40 mg  Ok to use Cialis - just do not take within 24 hr after taking Nitroglycerin (NTG).  Also - cannot take NTG within 48 hr after taking Cialis.    If you need a refill on your cardiac medications before your next appointment, please call your pharmacy.   Lab work:  [employer] & Fasting Lipid panel due in November  If you have labs (blood work) drawn today and your tests are completely normal, you will receive your results only by: Marland Kitchen MyChart Message (if you have MyChart) OR . A paper copy in the mail If you have any lab test that is abnormal or we need to change your treatment, we will call you to review the results.  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  Check to the DOT physical clinic this determine which test they will need: Either just a simple treadmill stress test versus a nuclear stress test. -->  Once you determine which one the required, we can go ahead and order that to be done in the November-December timeframe  Follow-Up: At [facility], you and your health needs are our priority.  As part of our continuing mission to provide you with exceptional heart care, we have created designated Provider Care Teams.  These Care Teams include your primary Cardiologist (physician) and Advanced Practice Providers (APPs -  Physician Assistants and Nurse Practitioners) who all work together to provide you with the care you need, when you need it. . You will need a follow up appointment in    4-5 months Jan or Feb 2021.  Please call our office 2 months in advance to schedule this appointment.  You may see Glenetta Hew, MD or one of the following  Advanced Practice Providers on your designated Care Team:   . Rosaria Ferries, PA-C . [name], DNP, ANP  Any Other Special Instructions Will Be Listed Below (If Applicable).  I am forwarding this note to Dr. Alyson Ingles.

## 2018-11-11 NOTE — Assessment & Plan Note (Signed)
Tolerating the increased dose of p.m. carvedilol and the PVCs seem to be doing much better.  No longer really noticing the jitteriness.

## 2018-11-11 NOTE — Assessment & Plan Note (Signed)
Have approved use of Cialis (Viagra preferable) since he has not required Nitrates.    While using Cialis, No use of NTG 24 hr prior to or 48 hr after taking it.  Will forward to his Urologist

## 2018-11-11 NOTE — Telephone Encounter (Signed)
LEFT MESSAGE ON VOICEMAIL FOR PATIENT. INSTRUCTION WAS GIVEN FROM TODAY'S VIRTUAL VISIT.  AVS SUMMARY IS MAILED WITH LABSLIPS ATTACHED. ANY QUESTION  MAY CALL BACK

## 2018-11-11 NOTE — Assessment & Plan Note (Signed)
Blood pressure trending up little bit.  We will increase losartan to 100 mg daily and continue current dose of carvedilol.

## 2018-11-11 NOTE — Assessment & Plan Note (Signed)
Tolerating aspirin plus Brilinta.    Okay to hold Brilinta for urgent surgeries, but for the elective procedures, would wait until after November 2020 at which point, Brilinta could be held 5 to 7 days preprocedure.  Will stop aspirin after 1 year  Will reduce to maintenance dose of Brilinta 60 mg twice daily at next visit.

## 2018-11-11 NOTE — Assessment & Plan Note (Addendum)
Doing well with no further angina since his MI.  Preserved EF on echo. Is on beta-blocker, ARB and statin as well as DAPT.  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   For now, I will plan to order a GXT in November timeframe unless they require a Myoview.  He will check with a DOT physical clinic to determine which test is necessary.  Based on what they tell him, we will happily order the study for November-December timeframe, indicating that this would be an annual follow-up from his MI.

## 2018-11-11 NOTE — Assessment & Plan Note (Signed)
No recurrent issues since his cardiac arrest.  He has made a concerted effort to clean up his life.

## 2018-11-11 NOTE — Assessment & Plan Note (Addendum)
-  Well-controlled on atorvastatin, but clearly on last labs he had had a relapse and worsening.  This was accounted for the fact that he had stopped taking atorvastatin because of muscle cramps modalities now.  We decided to stop atorvastatin and try to start rosuvastatin 40 mg daily.  We will check a new baseline lipids in the roughly November timeframe along with baseline chemistry.  He did well on atorvastatin, but had myalgias associated with it.

## 2018-11-11 NOTE — Progress Notes (Signed)
Virtual Visit via Telephone Note   This visit type was conducted due to national recommendations for restrictions regarding the COVID-19 Pandemic (e.g. social distancing) in an effort to limit this patient's exposure and mitigate transmission in our community.  Due to his co-morbid illnesses, this patient is at least at moderate risk for complications without adequate follow up.  This format is felt to be most appropriate for this patient at this time.  The patient did not have access to video technology/had technical difficulties with video requiring transitioning to audio format only (telephone).  All issues noted in this document were discussed and addressed.  No physical exam could be performed with this format.  Please refer to the patient's chart for his  consent to telehealth for [facility].  Evaluation Performed:  Follow-up visit  This visit type was conducted due to national recommendations for restrictions regarding the COVID-19 Pandemic (e.g. social distancing).  This format is felt to be most appropriate for this patient at this time.  All issues noted in this document were discussed and addressed.  Due to his comorbid illnesses, this patient is felt to be at least at moderate risk without adequate follow up.  No physical exam was performed (except for noted visual exam findings with Video Visits).  Please refer to the patient's chart (MyChart message for video visits and phone note for telephone visits) for the patient's consent to telehealth for [facility].  Date:  11/11/2018   ID:  Rodney Lloyd, DOB [birth year], MRN [id number]  Patient Location: Home Provider Location: [place], [place]  PCP:  Scot Jun, FNP  Cardiologist:  Glenetta Hew, MD  Electrophysiologist:  None  Urologist:  Dr. Nicolette Bang  Chief Complaint: 4-43-month follow-up  History of Present Illness:    Rodney Lloyd is a 48 y.o. male with a complicated history of witnessed VF arrest  secondary to anterior MI in November 2019 (DES PCI to LAD with PTCA of jailed D1, post PCI recovery Echo with preserved LVEF) who presents via audio/video conferencing for a telehealth visit today.  This is his 2nd visit with me having had several follow-up visits with [name], NP   Rodney Lloyd was admitted with witnessed VF arrest secondary to Anterior ST elevation MI November 2,2019 --> CPR with ROSC -- CATH: pLAD60%-ostD1 75% before 100% LAD --> DES PCI LAD across D1 w/ PTCA of D1.  65% pRI & mild-mod diffuse non-dom RCA. EF ~45-50% w/ Anterior HK. --> EF by Echo was 55 to 60% with anterior hypokinesis.   CODE COOL --> with ultimate neurologic recovery. (Cocaine +), HTN. --> prior to d/c, had a prolonged bradycardic event thought to be Vasovagal (during cough)  Clinic follow-up:  I recently saw Rodney Lloyd using telehealth conferencing back in April during the early stages of the COVID-19 lockdown.    At that time he noted a fluttering sensation to calm down but could not tell his heart rate goes up during stress or exercise.  No chest pain or pressure.    Had significantly cut down cigarettes.  Was 90 days into his ADS counseling.  Had changed diet and started exercising.  DOT physical in March  INTERVAL HISTORY: Rodney Lloyd is evaluated today via telephone. The patient does not have symptoms concerning for COVID-19 infection (fever, chills, cough, or new shortness of breath).  Still doing distancing. -- His employer went out of business due to [place]. 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  Working of reapplying for insurance - hopes to hear soon. Dealing with the paperwork has been difficult.  Saw Dr. Wilkie Aye - urology for ED (will forward note to him) re: use of Cialis.   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  Knee still hurts - related to OA.   Unable to run, but still walking. Palpitations notably improved. DOE if exerting hard - but not limiting & not with routing exercise.   Cardiovascular ROS: no chest pain or dyspnea on exertion positive for - edema and palpitations negative for - edema, orthopnea, paroxysmal nocturnal dyspnea, shortness of breath or syncope/near syncope, TIA/amaurosis fugax.     No more Drugs -- "DONE".  ~2.5 months without a cigarette.  ROS:  Please see the history of present illness.    Review of Systems  Constitutional: Negative for chills, fever, malaise/fatigue and weight loss.  HENT: Negative for congestion and nosebleeds.   Respiratory: Negative for cough, shortness of breath and wheezing (pollen related).   Gastrointestinal: Negative for blood in stool, diarrhea, heartburn, melena, nausea and vomiting.  Genitourinary: Negative for dysuria and hematuria.  Musculoskeletal: Positive for joint pain (R Knee OA still hurts - but better, just cannot run).  Neurological: Negative for dizziness, tingling (sometimes arm falls asleep while sleeping), focal weakness and headaches.  Psychiatric/Behavioral: Negative for depression, memory loss and substance abuse (almost completed ADS program). The patient is nervous/anxious (still a bit anxious going to sleep) and has insomnia (has wild dreams - but getting more sleep than before).   All other systems reviewed and are negative.   Past Medical History:  Diagnosis Date  . Acute ST elevation myocardial infarction (STEMI) involving left anterior descending (LAD) coronary artery (HCC) 01/04/2019   Post VF Arrest (cocaine +) --> 100% p-mLAD after D1 (pLAD 60% & Ost Diag 75%) --> DES PCI to LAD & PTCA of ~small D1.  EF back up to 55-60% by Echo on d/c - distal anterior HK.  Marland Kitchen Alcohol abuse   . CAD (coronary artery disease), native coronary artery    s/p DES PCI to LAD 01/2018 (Synergy DES 3 x 28 -- 3.6) acoss D1 with PTCA of D1.   . Cocaine abuse (HCC)   . HLD  (hyperlipidemia)   . Hypertension   . Tobacco abuse    Past Surgical History:  Procedure Laterality Date  . CORONARY/GRAFT ACUTE MI REVASCULARIZATION N/A 01/03/2018   Procedure: Coronary/Graft Acute MI Revascularization;  Surgeon: Marykay Lex, MD;  Location: [facility] INVASIVE CV LAB; DES PCI pLAD: Synergy DES 3 x 38 (3.6) & PTCA of OstD1 (jailed)   . RIGHT/LEFT HEART CATH AND CORONARY ANGIOGRAPHY N/A 01/03/2018   Procedure: RIGHT/LEFT HEART CATH AND CORONARY ANGIOGRAPHY;  Surgeon: Marykay Lex, MD;  Location[facility] INVAS[facility] CV LAB;  Service: Cardiovascular;;  (VF -> Ant STEMI): 100% mLAD, 80% 1st Diag & 70% Rmus Intermedius (RI) --> DES PCI pLAD & PTCA of D1.  EF~45% w/ dysentery anterior anterolateral HK.  Minimally Elevated LVEDP & PCWP & normal RHC pressures     Current Meds  Medication Sig  . aspirin 81 MG chewable tablet Chew 1 tablet (81 mg total) by mouth daily.  . carvedilol (COREG) 12.5 MG tablet Take 1 tablet (12.5 mg total) by mouth every morning AND 1.5 tablets (18.75 mg total) at bedtime.  . nitroGLYCERIN (NITROSTAT) 0.4 MG SL  tablet Place 1 tablet (0.4 mg total) under the tongue every 5 (five) minutes as needed for chest pain.  . rosuvastatin (CRESTOR) 40 MG tablet Take 1 tablet (40 mg total) by mouth daily.  . tadalafil (CIALIS) 5 MG tablet Take 1 tablet (5 mg total) by mouth daily. Stop medication if your require use of nitroglycerin.  . ticagrelor (BRILINTA) 90 MG TABS tablet Take 1 tablet (90 mg total) by mouth 2 (two) times daily.  . [DISCONTINUED] losartan (COZAAR) 50 MG tablet Take 1 tablet (50 mg total) by mouth daily.  . [DISCONTINUED] ticagrelor (BRILINTA) 90 MG TABS tablet Take 1 tablet (90 mg total) by mouth 2 (two) times daily.     Allergies:   Atorvastatin   Social History   Tobacco Use  . Smoking status: Former Smoker    Packs/day: 0.10    Types: Cigarettes    Quit date: 08/25/2018    Years since quitting: 0.2  . Smokeless tobacco: Never Used  Substance  Use Topics  . Alcohol use: Not Currently  . Drug use: Not Currently     Family Hx: The patient's family history includes Heart disease in his father; Hyperlipidemia in his mother.   Prior CV studies:   The following studies were reviewed today:  none   Labs/Other Tests and Data Reviewed:     EKG:  No ECG reviewed.  Recent Labs: 03/05/2018: Hemoglobin 13.1; Platelets 221 04/21/2018: Magnesium 2.1; TSH 1.410 10/23/2018: ALT 15; BUN 18; Creatinine, Ser 1.01; Potassium 4.7; Sodium 141   Recent Lipid Panel Lab Results  Component Value Date   CHOL 193 10/23/2018   HDL 38 (L) 10/23/2018   LDLCALC 131 (H) 10/23/2018   TRIG 121 10/23/2018   CHOLHDL 5.1 (H) 10/23/2018   ** had not been taking atorvastatin 2/2 muscle cramping -- changed to Rosuvastatin 40 mg qhs  Lab Results  Component Value Date   CREATININE 1.01 10/23/2018   BUN 18 10/23/2018   NA 141 10/23/2018   K 4.7 10/23/2018   CL 104 10/23/2018   CO2 21 10/23/2018   Lab Results  Component Value Date   WBC 6.3 03/05/2018   HGB 13.1 03/05/2018   HCT 39.3 03/05/2018   MCV 88 03/05/2018   PLT 221 03/05/2018    Wt Readings from Last 3 Encounters:  11/11/18 260 lb (117.9 kg)  05/07/18 251 lb 6.4 oz (114 kg)  04/29/18 249 lb (112.9 kg)     Objective:    Vital Signs:  BP (!) 148/100 (BP Location: Left Arm)   Pulse 64   Ht 5\' 10"  (1.778 m)   Wt 260 lb (117.9 kg)   BMI 37.31 kg/m   -- BP has been up a bit lately (higher earlier in AM) Telephonic evaluation Sounds healthy.  Normal Mood & Affect.  ASSESSMENT & PLAN:    Problem List Items Addressed This Visit    Cocaine abuse (HCC) (Chronic)    No recurrent issues since his cardiac arrest.  He has made a concerted effort to clean up his life.      Coronary artery disease involving native coronary artery without angina pectoris - Primary (Chronic)    Doing well with no further angina since his MI.  Preserved EF on echo. Is on beta-blocker, ARB and statin as  well as DAPT.  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   For now, I will plan to order a GXT in November timeframe unless  they require a Myoview.  He will check with a DOT physical clinic to determine which test is necessary.  Based on what they tell him, we will happily order the study for November-December timeframe, indicating that this would be an annual follow-up from his MI.      Relevant Medications   losartan (COZAAR) 100 MG tablet   Other Relevant Orders   Basic metabolic panel   Erectile dysfunction of organic origin (Chronic)    Have approved use of Cialis (Viagra preferable) since he has not required Nitrates.    While using Cialis, No use of NTG 24 hr prior to or 48 hr after taking it.  Will forward to his Urologist      Essential hypertension (Chronic)    Blood pressure trending up little bit.  We will increase losartan to 100 mg daily and continue current dose of carvedilol.      Relevant Medications   losartan (COZAAR) 100 MG tablet   Other Relevant Orders   Basic metabolic panel   Hyperlipidemia with target low density lipoprotein (LDL) cholesterol less than 70 mg/dL (Chronic)    -Well-controlled on atorvastatin, but clearly on last labs he had had a relapse and worsening.  This was accounted for the fact that he had stopped taking atorvastatin because of muscle cramps modalities now.  We decided to stop atorvastatin and try to start rosuvastatin 40 mg daily.  We will check a new baseline lipids in the roughly November timeframe along with baseline chemistry.  He did well on atorvastatin, but had myalgias associated with it.      Relevant Medications   losartan (COZAAR) 100 MG tablet   Other Relevant Orders   Lipid panel   Basic metabolic panel   Palpitations (Chronic)    Tolerating the increased dose of p.m. carvedilol and the PVCs seem to be doing much better.  No longer really noticing the jitteriness.       Relevant Orders   Basic metabolic panel   Presence of drug coated stent in LAD coronary artery (Chronic)    Tolerating aspirin plus Brilinta.    Okay to hold Brilinta for urgent surgeries, but for the elective procedures, would wait until after November 2020 at which point, Brilinta could be held 5 to 7 days preprocedure.  Will stop aspirin after 1 year  Will reduce to maintenance dose of Brilinta 60 mg twice daily at next visit.       Tobacco abuse (Chronic)    He is now almost 3 months out from his last cigarette.  Doing well.  Avoiding cavitations.  Smoking cessation instruction/counseling given:  commended patient for quitting and reviewed strategies for preventing relapses         COVID-19 Education: The signs and symptoms of COVID-19 were discussed with the patient and how to seek care for testing (follow up with PCP or arrange E-visit).  The importance of social distancing was discussed today.  Time:   Today, I have spent 26 minutes with the patient with telehealth technology discussing the above problems.    Medication Adjustments/Labs and Tests Ordered:  Patient Instructions  Medication Instructions:    We are going to increase your dose of Losartan to 100 mg daily (take 2 of your current 50 mg tabs). Rx: Losartan 100 mg PO daily.  Disp # 90. Refill 4  Continue Taking the new cholesterol medication --> rosuvastatin 40 mg  Ok to use Cialis - just do not take within 24 hr after  taking Nitroglycerin (NTG).  Also - cannot take NTG within 48 hr after taking Cialis.    If you need a refill on your cardiac medications before your next appointment, please call your pharmacy.   Lab work:  [employer] & Fasting Lipid panel due in November  If you have labs (blood work) drawn today and your tests are completely normal, you will receive your results only by: Marland Kitchen. MyChart Message (if you have MyChart) OR . A paper copy in the mail If you have any lab test that is abnormal or we  need to change your treatment, we will call you to review the results.  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  Check to the DOT physical clinic this determine which test they will need: Either just a simple treadmill stress test versus a nuclear stress test. -->  Once you determine which one the required, we can go ahead and order that to be done in the November-December timeframe  Follow-Up: At [facility], you and your health needs are our priority.  As part of our continuing mission to provide you with exceptional heart care, we have created designated Provider Care Teams.  These Care Teams include your primary Cardiologist (physician) and Advanced Practice Providers (APPs -  Physician Assistants and Nurse Practitioners) who all work together to provide you with the care you need, when you need it. . You will need a follow up appointment in    4-5 months Jan or Feb 2021.  Please call our office 2 months in advance to schedule this appointment.  You may see Bryan Lemmaavid Anel Purohit, MD or one of the following Advanced Practice Providers on your designated Care Team:   . Theodore DemarkRhonda Barrett, PA-C . Joni ReiningKathryn Lawrence, DNP, ANP  Any Other Special Instructions Will Be Listed Below (If Applicable).  I am forwarding this note to Dr. Ronne BinningMcKenzie.     Signed, Bryan Lemmaavid Secily Walthour, MD  11/11/2018 11:49 AM    [facility]

## 2018-11-11 NOTE — Telephone Encounter (Signed)
PATIENT RETURN CALL - INSTRUCTION GIVEN - MEDICATION E SENT TO PHARMACY . LABS MAILED   PATIENT VERBALIZED UNDERSTANDING

## 2018-11-11 NOTE — Assessment & Plan Note (Signed)
He is now almost 3 months out from his last cigarette.  Doing well.  Avoiding cavitations.  Smoking cessation instruction/counseling given:  commended patient for quitting and reviewed strategies for preventing relapses

## 2018-11-13 MED FILL — TADALAFIL 5 MG TABS: 5 | 30 days supply | Qty: 30 | Fill #2

## 2018-11-19 MED FILL — ?TADALAFIL 20 MG TABS: 20 | 30 days supply | Qty: 10 | Fill #0

## 2018-12-01 ENCOUNTER — Telehealth: Payer: Self-pay | Admitting: Family Medicine

## 2018-12-01 NOTE — Telephone Encounter (Signed)
I called the Pt since received an appt for CAFA and OC, Pt does not have an appt schedule, LVM inform him of this also to ask to speak with financial since he is missing some documents for his appt after he schedule

## 2018-12-08 ENCOUNTER — Telehealth: Payer: Self-pay | Admitting: Cardiology

## 2018-12-08 NOTE — Telephone Encounter (Signed)
Patient returned call

## 2018-12-08 NOTE — Telephone Encounter (Signed)
[id number] 

## 2018-12-08 NOTE — Telephone Encounter (Signed)
°*  STAT* If patient is at the pharmacy, call can be transferred to refill team.   1. Which medications need to be refilled? (please list name of each medication and dose if known) ticagrelor (BRILINTA) 90 MG TABS tablet losartan (COZAAR) 100 MG tablet  2. Which pharmacy/location (including street and city if local pharmacy) is medication to be sent to? [place], [place]   3. Do they need a 30 day or 90 day supply?  90 day

## 2018-12-08 NOTE — Telephone Encounter (Signed)
Patient states he has been having cramping on his  left upper thigh and shin, he is not sure if it's something he had done or if it was his medication.

## 2018-12-09 ENCOUNTER — Other Ambulatory Visit: Payer: Self-pay | Admitting: *Deleted

## 2018-12-09 MED ORDER — TICAGRELOR 90 MG PO TABS: 90.0000 mg | ORAL_TABLET | Freq: Two times a day (BID) | ORAL | 2 refills | Status: DC

## 2018-12-09 MED ORDER — LOSARTAN POTASSIUM 100 MG PO TABS: 100.0000 mg | ORAL_TABLET | Freq: Every day | ORAL | 2 refills | Status: DC

## 2018-12-09 MED FILL — $BRILINTA 90 MG TABLET: 90 | 90 days supply | Qty: 180 | Fill #0

## 2018-12-09 MED FILL — LOSARTAN POTASSIUM 100 MG T: 100 | 30 days supply | Qty: 30 | Fill #1

## 2018-12-09 NOTE — Telephone Encounter (Signed)
Requested Prescriptions   Signed Prescriptions Disp Refills  . losartan (COZAAR) 100 MG tablet 90 tablet 2    Sig: Take 1 tablet (100 mg total) by mouth daily. Discontinue  Previous dose of 50 mg    Authorizing Provider: Ellyn Hack, DAVID W    Ordering User: Eugenio Hoes, MARINA C  . ticagrelor (BRILINTA) 90 MG TABS tablet 180 tablet 2    Sig: Take 1 tablet (90 mg total) by mouth 2 (two) times daily.    Authorizing Provider: Leonie Man    Ordering User: Britt Bottom

## 2018-12-09 NOTE — Telephone Encounter (Signed)
Requested Prescriptions   Signed Prescriptions Disp Refills  . losartan (COZAAR) 100 MG tablet 90 tablet 2    Sig: Take 1 tablet (100 mg total) by mouth daily. Discontinue  Previous dose of 50 mg    Authorizing Provider: HARDING, DAVID W    Ordering User: LOPEZ, MARINA C  . ticagrelor (BRILINTA) 90 MG TABS tablet 180 tablet 2    Sig: Take 1 tablet (90 mg total) by mouth 2 (two) times daily.    Authorizing Provider: HARDING, DAVID W    Ordering User: LOPEZ, MARINA C    

## 2018-12-14 ENCOUNTER — Ambulatory Visit: Payer: Self-pay

## 2018-12-14 ENCOUNTER — Ambulatory Visit: Payer: Self-pay | Attending: Family Medicine

## 2018-12-14 ENCOUNTER — Other Ambulatory Visit: Payer: Self-pay

## 2018-12-16 ENCOUNTER — Ambulatory Visit: Payer: Self-pay | Admitting: Urology

## 2018-12-16 NOTE — Telephone Encounter (Signed)
Lm2cb 

## 2018-12-24 MED FILL — NITROGLYCERIN 0.4 MG TAB SL: 0.4 | 25 days supply | Qty: 25 | Fill #0

## 2018-12-24 MED FILL — ?TADALAFIL 20 MG TABS: 20 | 30 days supply | Qty: 10 | Fill #1

## 2019-01-04 DIAGNOSIS — I2102 ST elevation (STEMI) myocardial infarction involving left anterior descending coronary artery: Secondary | ICD-10-CM

## 2019-01-04 HISTORY — DX: ST elevation (STEMI) myocardial infarction involving left anterior descending coronary artery: I21.02

## 2019-01-12 ENCOUNTER — Telehealth: Payer: Self-pay | Admitting: Cardiology

## 2019-01-12 DIAGNOSIS — I2510 Atherosclerotic heart disease of native coronary artery without angina pectoris: Secondary | ICD-10-CM

## 2019-01-12 NOTE — Telephone Encounter (Signed)
Will route to MD to make sure okay to send in for order. Thanks!

## 2019-01-12 NOTE — Telephone Encounter (Signed)
  Patient is calling because he wants to let Dr Ellyn Hack know that he does want to go ahead with a stress test. Order is needed

## 2019-01-13 LAB — BASIC METABOLIC PANEL
BUN/Creatinine Ratio: 12 (ref 9–20)
BUN: 11 mg/dL (ref 6–24)
CO2: 20 mmol/L (ref 20–29)
Calcium: 9.2 mg/dL (ref 8.7–10.2)
Chloride: 101 mmol/L (ref 96–106)
Creatinine, Ser: 0.93 mg/dL (ref 0.76–1.27)
GFR calc Af Amer: 112 mL/min/{1.73_m2} (ref >59)
GFR calc non Af Amer: 97 mL/min/{1.73_m2} (ref >59)
Glucose: 97 mg/dL (ref 65–99)
Potassium: 4.5 mmol/L (ref 3.5–5.2)
Sodium: 137 mmol/L (ref 134–144)

## 2019-01-13 LAB — LIPID PANEL
Chol/HDL Ratio: 2.6 ratio (ref 0.0–5.0)
Cholesterol, Total: 100 mg/dL (ref 100–199)
HDL: 39 mg/dL — ABNORMAL LOW (ref >39)
LDL Chol Calc (NIH): 43 mg/dL (ref 0–99)
Triglycerides: 92 mg/dL (ref 0–149)
VLDL Cholesterol Cal: 18 mg/dL (ref 5–40)

## 2019-01-13 MED FILL — LOSARTAN POTASSIUM 100 MG T: 100 | 30 days supply | Qty: 30 | Fill #2

## 2019-01-13 NOTE — Telephone Encounter (Signed)
Yes -the question we did know is for a DOT physical standpoint of a only knee GXT or with a require a Myoview.  If he can tell us which one they need, we will be happy to order one.  Glenetta Hew, MD;

## 2019-01-19 ENCOUNTER — Ambulatory Visit (INDEPENDENT_AMBULATORY_CARE_PROVIDER_SITE_OTHER): Payer: Self-pay | Admitting: Internal Medicine

## 2019-01-19 DIAGNOSIS — N529 Male erectile dysfunction, unspecified: Secondary | ICD-10-CM

## 2019-01-19 DIAGNOSIS — E785 Hyperlipidemia, unspecified: Secondary | ICD-10-CM

## 2019-01-19 DIAGNOSIS — Z23 Encounter for immunization: Secondary | ICD-10-CM

## 2019-01-19 DIAGNOSIS — Z87891 Personal history of nicotine dependence: Secondary | ICD-10-CM

## 2019-01-19 MED ORDER — ROSUVASTATIN CALCIUM 40 MG PO TABS: 40.0000 mg | ORAL_TABLET | Freq: Every day | ORAL | 3 refills | Status: DC

## 2019-01-19 MED ORDER — TADALAFIL 20 MG PO TABS: ORAL_TABLET | ORAL | 3 refills | Status: DC

## 2019-01-19 MED FILL — ROSUVASTATIN CALCIUM 40 MG: 40 | 90 days supply | Qty: 90 | Fill #1

## 2019-01-19 MED FILL — TADALAFIL 20 MG TABS: 20 | 30 days supply | Qty: 10 | Fill #2

## 2019-01-19 NOTE — Progress Notes (Signed)
Requesting refill on Cialis.

## 2019-01-19 NOTE — Progress Notes (Signed)
Virtual Visit via Telephone Note Due to current restrictions/limitations of in-office visits due to the COVID-19 pandemic, this scheduled clinical appointment was converted to a telehealth visit  I connected with Rodney Lloyd on 01/19/19 at 11:02 a.m by telephone and verified that I am speaking with the correct person using two identifiers. I am in my office.  The patient is at home.  Only the patient and myself participated in this encounter.  I discussed the limitations, risks, security and privacy concerns of performing an evaluation and management service by telephone and the availability of in person appointments. I also discussed with the patient that there may be a patient responsible charge related to this service. The patient expressed understanding and agreed to proceed.   History of Present Illness: Patient with history of CAD, HTN, former smoker, ED.  He was last evaluated by NP Harris in 08/2018.  He was evaluated by his cardiologist Dr. Herbie Baltimore on 11/11/2018.  ED:  Pt requesting RF.  Doing okay on med.  Nurse practitioner Tiburcio Pea had him on 5 mg daily.  However she referred him to urology.  The urologist placed him on 20 mg to use as needed.  He gets 10 tablets/month.  Of note sublingual nitro is on his med list.  Patient states that he has not had to use sublingual nitroglycerin since he was first prescribed it almost a year ago.  His cardiologist is aware that he is on Cialis and had informed him on his last visit in September to not use nitrates 24 hours prior to 48 hours after using Cialis.  Patient states he is aware of this instruction.   Reports compliance with heart meds.  Needs RF on Crestor.  Last rxn written as a 90 day supply by Dr. Herbie Baltimore with refill but pt states he was only getting a 30 day supply.  Prefers to get 90 day supply.  He quit smoking about 2 months ago and so far is doing well. He needs Tdap and influenza vaccine.  He is agreeable to coming in to have these  vaccines.   Outpatient Encounter Medications as of 01/19/2019  Medication Sig  . aspirin 81 MG chewable tablet Chew 1 tablet (81 mg total) by mouth daily.  . carvedilol (COREG) 12.5 MG tablet Take 1 tablet (12.5 mg total) by mouth every morning AND 1.5 tablets (18.75 mg total) at bedtime.  Marland Kitchen losartan (COZAAR) 100 MG tablet Take 1 tablet (100 mg total) by mouth daily. Discontinue  Previous dose of 50 mg  . rosuvastatin (CRESTOR) 40 MG tablet Take 1 tablet (40 mg total) by mouth daily.  . tadalafil (CIALIS) 5 MG tablet Take 1 tablet (5 mg total) by mouth daily. Stop medication if your require use of nitroglycerin.  . ticagrelor (BRILINTA) 90 MG TABS tablet Take 1 tablet (90 mg total) by mouth 2 (two) times daily.  . nitroGLYCERIN (NITROSTAT) 0.4 MG SL tablet Place 1 tablet (0.4 mg total) under the tongue every 5 (five) minutes as needed for chest pain. (Patient not taking: Reported on 01/19/2019)   No facility-administered encounter medications on file as of 01/19/2019.       Observations/Objective: No direct observation done as this was a telephone encounter.  Assessment and Plan: 1. Erectile dysfunction, unspecified erectile dysfunction type Refill given on Cialis as prescribed by the urologist.  Reiterated with patient not to use nitroglycerin 24-hour prior to use or 48 hours after use of Cialis. - tadalafil (CIALIS) 20 MG tablet; Take 1  tab 1/2 to 1 hr prior to intercourse PRN. Do not use Nitroglycerin 24 hr prior to use or 48 hrs after use of cialis..  Dispense: 10 tablet; Refill: 3  2. Former smoker Commended him on quitting.  Encouraged him to remain tobacco free  3. Need for Tdap vaccination 4. Need for influenza vaccination My CMA will schedule him to come in to get his flu and Tdap vaccines  5. Hyperlipidemia with target low density lipoprotein (LDL) cholesterol less than 70 mg/dL Refill on Crestor written for 90-day supply.  I have informed the patient that when he goes to  the pharmacy he needs to let them know that he would like the 90-day supply - rosuvastatin (CRESTOR) 40 MG tablet; Take 1 tablet (40 mg total) by mouth daily.  Dispense: 90 tablet; Refill: 3  Follow Up Instructions: F/u in 3 mths   I discussed the assessment and treatment plan with the patient. The patient was provided an opportunity to ask questions and all were answered. The patient agreed with the plan and demonstrated an understanding of the instructions.   The patient was advised to call back or seek an in-person evaluation if the symptoms worsen or if the condition fails to improve as anticipated.  I provided 9 minutes of non-face-to-face time during this encounter.   Karle Plumber, MD

## 2019-01-20 ENCOUNTER — Ambulatory Visit: Payer: Self-pay

## 2019-01-21 ENCOUNTER — Ambulatory Visit: Payer: Self-pay

## 2019-02-01 ENCOUNTER — Ambulatory Visit (INDEPENDENT_AMBULATORY_CARE_PROVIDER_SITE_OTHER): Payer: Self-pay

## 2019-02-01 ENCOUNTER — Other Ambulatory Visit: Payer: Self-pay

## 2019-02-01 VITALS — Temp 97.7°F

## 2019-02-01 DIAGNOSIS — Z23 Encounter for immunization: Secondary | ICD-10-CM

## 2019-02-01 MED FILL — ?CARVEDILOL 12.5 MG TABLET: 12.5 | 30 days supply | Qty: 75 | Fill #1 | Status: TO

## 2019-02-01 MED FILL — LOSARTAN POTASSIUM 100 MG T: 100 | 30 days supply | Qty: 30 | Fill #3

## 2019-02-01 NOTE — Progress Notes (Signed)
Patient here for Tdap booster & flu shot. KWalker, CMA.

## 2019-02-15 MED FILL — TADALAFIL 20 MG TABS: 20 | 30 days supply | Qty: 10 | Fill #3

## 2019-03-01 MED FILL — ?CARVEDILOL 12.5 MG TABLET: 12.5 | 30 days supply | Qty: 75 | Fill #2 | Status: TO

## 2019-03-01 MED FILL — $BRILINTA 90 MG TABLET: 90 | 90 days supply | Qty: 180 | Fill #1

## 2019-03-04 MED FILL — LOSARTAN POTASSIUM 100 MG T: 100 | 30 days supply | Qty: 30 | Fill #4

## 2019-03-10 MED FILL — TADALAFIL 20 MG TABS: 20 | 30 days supply | Qty: 10 | Fill #0

## 2019-04-07 MED FILL — TADALAFIL 20 MG TABS: 20 | 30 days supply | Qty: 10 | Fill #1

## 2019-04-07 MED FILL — LOSARTAN POTASSIUM 100 MG T: 100 | 30 days supply | Qty: 30 | Fill #5

## 2019-04-07 MED FILL — ?CARVEDILOL 12.5 MG TABLET: 12.5 | 30 days supply | Qty: 75 | Fill #3 | Status: TO

## 2019-04-13 ENCOUNTER — Ambulatory Visit: Payer: Self-pay | Admitting: Cardiology

## 2019-04-15 ENCOUNTER — Encounter: Payer: Self-pay | Admitting: Adult Health

## 2019-04-15 ENCOUNTER — Ambulatory Visit (INDEPENDENT_AMBULATORY_CARE_PROVIDER_SITE_OTHER): Payer: Self-pay | Admitting: Adult Health

## 2019-04-15 ENCOUNTER — Other Ambulatory Visit: Payer: Self-pay | Admitting: Adult Health

## 2019-04-15 ENCOUNTER — Other Ambulatory Visit: Payer: Self-pay

## 2019-04-15 VITALS — BP 152/94 | HR 71 | Ht 70.0 in | Wt 260.0 lb

## 2019-04-15 DIAGNOSIS — I10 Essential (primary) hypertension: Secondary | ICD-10-CM

## 2019-04-15 DIAGNOSIS — Z79899 Other long term (current) drug therapy: Secondary | ICD-10-CM

## 2019-04-15 DIAGNOSIS — I2510 Atherosclerotic heart disease of native coronary artery without angina pectoris: Secondary | ICD-10-CM

## 2019-04-15 DIAGNOSIS — Z955 Presence of coronary angioplasty implant and graft: Secondary | ICD-10-CM

## 2019-04-15 DIAGNOSIS — E785 Hyperlipidemia, unspecified: Secondary | ICD-10-CM

## 2019-04-15 DIAGNOSIS — I2109 ST elevation (STEMI) myocardial infarction involving other coronary artery of anterior wall: Secondary | ICD-10-CM

## 2019-04-15 MED ORDER — CHLORTHALIDONE 25 MG PO TABS: 25.0000 mg | ORAL_TABLET | Freq: Every day | ORAL | 3 refills | Status: DC

## 2019-04-15 MED FILL — ?CHLORTHALIDONE 25MG TABL: 25 | 30 days supply | Qty: 30 | Fill #0

## 2019-04-15 NOTE — Progress Notes (Signed)
Cardiology Office Note   Date:  04/15/2019   ID:  Rodney Lloyd, DOB Jan 30, 1971, MRN [id number]  PCP:  Scot Jun, FNP  Cardiologist:  Dr. Ellyn Hack  No chief complaint on file.    History of Present Illness: Rodney Lloyd is a 49 y.o. male who presents for ongoing assessment and management of CAD, after anterior MI November 2019 which occurred after witnessed VF arrest.  Cardiac catheterization revealed proximal LAD 60 to 70%, ostial diagonal 1, 75%, before 100% occluded LAD.  The patient subsequently had DES PCI LAD across the diagonal 1 with PTCA of diagonal 1.  Additionally he had 65% proximal RI and mid to moderate diffuse nondominant RCA disease.  EF was 45% to 50%.  Other history includes tobacco abuse, hyper cholesterolemia, EtOH abuse, history of cocaine abuse, hypertension.   He was last evaluated via telemedicine on 11/11/2018 by Dr. Ellyn Hack.  He noted that the patient had abstain from cocaine since his cardiac arrest, he was without any complaints of angina was continued on beta-blocker therapy with dual antiplatelet therapy, ARB and statin therapy.  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  A GXT was ordered but not yet completed on date of this office visit.  He has not been working and therefore did not require the stress test. He states that his BP has been more elevated over the last few months, as he has been home. He is not physically active, but did take a walk this morning before coming to the appointment. He is medically compliant.   Past Medical History:  Diagnosis Date  . Acute ST elevation myocardial infarction (STEMI) involving left anterior descending (LAD) coronary artery (HCC) 01/04/2019   Post VF Arrest (cocaine +) --> 100% p-mLAD after D1 (pLAD 60% & Ost Diag 75%) --> DES PCI to LAD & PTCA of ~small D1.  EF back up to 55-60% by Echo on d/c - distal anterior HK.  Marland Kitchen Alcohol abuse   . CAD (coronary artery disease), native coronary artery      s/p DES PCI to LAD 01/2018 (Synergy DES 3 x 28 -- 3.6) acoss D1 with PTCA of D1.   . Cocaine abuse ([place])   . HLD (hyperlipidemia)   . Hypertension   . Tobacco abuse     Past Surgical History:  Procedure Laterality Date  . CORONARY/GRAFT ACUTE MI REVASCULARIZATION N/A 01/03/2018   Procedure: Coronary/Graft Acute MI Revascularization;  Surgeon: Leonie Man, MD;  Location: [facility]AS[facility]; DES PCI pLAD: Synergy DES 3 x 38 (3.6) & PTCA of OstD1 (jailed)   . RIGHT/LEFT HEART CATH AND CORONARY ANGIOGRAPHY N/A 01/03/2018   Procedure: RIGHT/LEFT HEART CATH AND CORONARY ANGIOGRAPHY;  Surgeon: Leonie Man, MD;  Location: [facility];  Service: Cardiovascular;;  (VF -> Ant STEMI): 100% mLAD, 80% 1st Diag & 70% Rmus Intermedius (RI) --> DES PCI pLAD & PTCA of D1.  EF~45% w/ dysentery anterior anterolateral HK.  Minimally Elevated LVEDP & PCWP & normal RHC pressures     Current Outpatient Medications  Medication Sig Dispense Refill  . aspirin 81 MG chewable tablet Chew 1 tablet (81 mg total) by mouth daily.    . carvedilol (COREG) 12.5 MG tablet Take 1 tablet (12.5 mg total) by mouth every morning AND 1.5 tablets (18.75 mg total) at bedtime. 225 tablet 3  . losartan (COZAAR) 100 MG tablet Take 1 tablet (100 mg total) by mouth daily. Discontinue  Previous dose of 50 mg 90 tablet 2  . nitroGLYCERIN (NITROSTAT) 0.4 MG SL tablet Place 1 tablet (0.4 mg total) under the tongue every 5 (five) minutes as needed for chest pain. 25 tablet 2  . rosuvastatin (CRESTOR) 40 MG tablet Take 1 tablet (40 mg total) by mouth daily. 90 tablet 3  . tadalafil (CIALIS) 20 MG tablet Take 1 tab 1/2 to 1 hr prior to intercourse PRN. Do not use Nitroglycerin 24 hr prior to use or 48 hrs after use of cialis.. 10 tablet 3  . ticagrelor (BRILINTA) 90 MG TABS tablet Take 1 tablet (90 mg total) by mouth 2 (two) times daily. 180 tablet 2  . chlorthalidone (HYGROTON) 25 MG tablet Take 1 tablet (25 mg total) by mouth  daily. 90 tablet 3   No current facility-administered medications for this visit.    Allergies:   Atorvastatin    Social History:  The patient  reports that he quit smoking about 7 months ago. His smoking use included cigarettes. He smoked 0.10 packs per day. He has never used smokeless tobacco. He reports previous alcohol use. He reports previous drug use.   Family History:  The patient's family history includes Heart disease in his father; Hyperlipidemia in his mother.    ROS: All other systems are reviewed and negative. Unless otherwise mentioned in H&P    PHYSICAL EXAM: VS:  BP (!) 152/94   Pulse 71   Ht 5\' 10"  (1.778 m)   Wt 260 lb (117.9 kg)   BMI 37.31 kg/m  , BMI Body mass index is 37.31 kg/m. GEN: Well nourished, well developed, in no acute distress, obese. HEENT: normal Neck: no JVD, carotid bruits, or masses Cardiac: RRR; no murmurs, rubs, or gallops,no edema  Respiratory:  Clear to auscultation bilaterally, normal work of breathing GI: soft, nontender, nondistended, + BS MS: no deformity or atrophy Skin: warm and dry, no rash Neuro:  Strength and sensation are intact Psych: euthymic mood, full affect   EKG:  Personally reviewed. NSR rate of 71 bpm.   Recent Labs: 04/21/2018: Magnesium 2.1; TSH 1.410 10/23/2018: ALT 15 01/13/2019: BUN 11; Creatinine, Ser 0.93; Potassium 4.5; Sodium 137    Lipid Panel    Component Value Date/Time   CHOL 100 01/13/2019 0914   TRIG 92 01/13/2019 0914   HDL 39 (L) 01/13/2019 0914   CHOLHDL 2.6 01/13/2019 0914   CHOLHDL 4.9 01/07/2018 1131   VLDL 26 01/07/2018 1131   LDLCALC 43 01/13/2019 0914      Wt Readings from Last 3 Encounters:  04/15/19 260 lb (117.9 kg)  11/11/18 260 lb (117.9 kg)  05/07/18 251 lb 6.4 oz (114 kg)      Other studies Reviewed: LHC 01/2018   CULPRIT BIFURCATION LESION: Prox LAD lesion is 60% stenosed with 75% stenosed side branch in Ost 1st Diag. Prox LAD to Mid LAD lesion is 100%  stenosed.  A drug-eluting stent was successfully placed (beginning proximal to diagonal branch) using a STENT SYNERGY DES 3X28. -Postdilated to 3.6 mm. Post intervention, there is a 0% residual stenosis in the LAD  Balloon angioplasty was performed on Ost 1st Diag using a BALLOON SAPPHIRE 2.0X12. Post intervention, the side branch was reduced to 20% residual stenosis.  ----------------------------------------------------------  Ost Ramus lesion is 65% stenosed.  ----------------------------------------------------------  There is mild left ventricular systolic dysfunction. The left ventricular ejection fraction is 45-50% by visual estimate.  LV End Diastolic Pressure is mildly elevated pre-PCI, however post PCI down to  13 mmHg and PCWP 11 mmHg  NORMAL RIGHT HEART CATH PRESSURES   SUMMARY  Ventricular Tachycardia / Fibrillation Cardiac Arrest 2/2 AnteroLateral-Inferior STEMI  Severe 2-3 Vessel CAD - 100% mLAD, 80% 1st Diag & 70% Rmus Intermedius (RI).  Successful PCI of LAD with DES & PTCA of ost-prox 1st Diag  EF ~45% with distal Anterior-anterolateral hypokinesis.  Minimally Elevated LVEDP & PCWP with normal RHC pressures -borderline but not true cardiogenic shock  Low Normal CO/CI (with Neosynephrine off).  Acute Respiratory Failure with Acidemia  Metabolic Acidosis - treated with 2 Amp NaHCO3   ASSESSMENT AND PLAN:  1. CAD: Hx of OOH VT arrest with DES to the LAD proximal to the 1st diagonal with balloon angioplasty on 1st ostial diagonal in 01/2018.  He has been without cardiac complaints and medically compliant. No chest pain or DOE. 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 He does not wish to proceed with stress test at this time as he would like to work on BP lowering first so that he can be considered for different job. Continue secondary risk management. No changes in his regimen for now. Continue DAPT/.   2.  Hypertension: BP is not optimal for patient with CAD. I will add chlorthalidone 25 mg daily to carvedilol and losartan regimen. He will have BMET in one week. See him on follow up virtually for BP check on medication changes. Consider evaluation for OSA if his BP is not better controlled or resistent  to regimen.   3.  Hypercholesterolemia; Continue rosuvastatin. Goal of LDL < 70. Was 54 on labs in 01/2019. Due for repeat labs in May 2021.      Current medicines are reviewed at length with the patient today.  I have spent  35 minutes dedicated to the care of this patient on the date of this encounter to include pre-visit review of records, assessment, management and diagnostic testing,with shared decision making.  Labs/ tests ordered today include: BMET  Bettey Mare. Liborio Nixon, ANP, AACC   04/15/2019 11:14 AM    [facility] [address] Office [phone] Fax [phone]  Notice: This dictation was prepared with Dragon dictation along with smaller phrase technology. Any transcriptional errors that result from this process are unintentional and may not be corrected upon review.

## 2019-04-15 NOTE — Patient Instructions (Signed)
Medication Instructions:  START- Chlorthalidone 25 mg by mouth daily  *If you need a refill on your cardiac medications before your next appointment, please call your pharmacy*  Lab Work: BMP in 1 week  If you have labs (blood work) drawn today and your tests are completely normal, you will receive your results only by: Marland Kitchen MyChart Message (if you have MyChart) OR . A paper copy in the mail If you have any lab test that is abnormal or we need to change your treatment, we will call you to review the results.  Testing/Procedures: None Ordered  Follow-Up: At [facility], you and your health needs are our priority.  As part of our continuing mission to provide you with exceptional heart care, we have created designated Provider Care Teams.  These Care Teams include your primary Cardiologist (physician) and Advanced Practice Providers (APPs -  Physician Assistants and Nurse Practitioners) who all work together to provide you with the care you need, when you need it.  Your next appointment:   Thursday March 11th  The format for your next appointment:   Virtual Visit   Provider:   Joni Reining, DNP, ANP

## 2019-04-23 ENCOUNTER — Other Ambulatory Visit: Payer: Self-pay | Admitting: Adult Health

## 2019-04-24 LAB — BASIC METABOLIC PANEL
BUN/Creatinine Ratio: 18 (ref 9–20)
BUN: 21 mg/dL (ref 6–24)
CO2: 24 mmol/L (ref 20–29)
Calcium: 9.6 mg/dL (ref 8.7–10.2)
Chloride: 102 mmol/L (ref 96–106)
Creatinine, Ser: 1.14 mg/dL (ref 0.76–1.27)
GFR calc Af Amer: 87 mL/min/{1.73_m2} (ref >59)
GFR calc non Af Amer: 75 mL/min/{1.73_m2} (ref >59)
Glucose: 98 mg/dL (ref 65–99)
Potassium: 4.4 mmol/L (ref 3.5–5.2)
Sodium: 140 mmol/L (ref 134–144)

## 2019-04-26 MED FILL — ROSUVASTATIN CALCIUM 40 MG: 40 | 90 days supply | Qty: 90 | Fill #2

## 2019-04-26 MED FILL — ?CARVEDILOL 12.5 MG TABLET: 12.5 | 30 days supply | Qty: 75 | Fill #4 | Status: TO

## 2019-04-26 MED FILL — TADALAFIL 20 MG TABS: 20 | 30 days supply | Qty: 10 | Fill #2

## 2019-05-10 MED FILL — LOSARTAN POTASSIUM 100 MG T: 100 | 30 days supply | Qty: 30 | Fill #6

## 2019-05-10 MED FILL — ?CHLORTHALIDONE 25MG TABL: 25 | 30 days supply | Qty: 30 | Fill #1

## 2019-05-12 NOTE — Progress Notes (Signed)
Virtual Visit via Telephone Note   This visit type was conducted due to national recommendations for restrictions regarding the COVID-19 Pandemic (e.g. social distancing) in an effort to limit this patient's exposure and mitigate transmission in our community.  Due to his co-morbid illnesses, this patient is at least at moderate risk for complications without adequate follow up.  This format is felt to be most appropriate for this patient at this time.  The patient did not have access to video technology/had technical difficulties with video requiring transitioning to audio format only (telephone).  All issues noted in this document were discussed and addressed.  No physical exam could be performed with this format.  Please refer to the patient's chart for his  consent to telehealth for [facility].   Date:  05/13/2019   ID:  Rodney Lloyd, DOB 05/19/1970, MRN [id number]  Patient Location: Home Provider Location: Home  PCP:  Bing Neighbors, FNP  Cardiologist:  Bryan Lemma, MD  Electrophysiologist:  None   Evaluation Performed:  Follow-Up Visit  Chief Complaint: Follow up  History of Present Illness:    Rodney Lloyd is a 49 y.o. male who presents for ongoing assessment and management of CAD, after anterior MI November 2019 which occurred after witnessed VF arrest.  Cardiac catheterization revealed proximal LAD 60 to 70%, ostial diagonal 1, 75%, before 100% occluded LAD.  The patient subsequently had DES PCI LAD across the diagonal 1 with PTCA of diagonal 1.  Additionally he had 65% proximal RI and mid to moderate diffuse nondominant RCA disease.  EF was 45% to 50%.  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 BP was elevated. I added chlorthalidone 25 mg to his regimen and he was continued on losartan and carvedilol.    He states that his BP is much better controlled on the chlorthalidone in  addition to his other antihypertensives. 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 He is not having any symptoms of chest pain, dizziness, or DOE. No leg weakness or cramping. No excessive bruising or bleeding.   The patient does not have symptoms concerning for COVID-19 infection (fever, chills, cough, or new shortness of breath).    Past Medical History:  Diagnosis Date  . Acute ST elevation myocardial infarction (STEMI) involving left anterior descending (LAD) coronary artery (HCC) 01/04/2019   Post VF Arrest (cocaine +) --> 100% p-mLAD after D1 (pLAD 60% & Ost Diag 75%) --> DES PCI to LAD & PTCA of ~small D1.  EF back up to 55-60% by Echo on d/c - distal anterior HK.  Marland Kitchen Alcohol abuse   . CAD (coronary artery disease), native coronary artery    s/p DES PCI to LAD 01/2018 (Synergy DES 3 x 28 -- 3.6) acoss D1 with PTCA of D1.   . Cocaine abuse ([facility])   . HLD (hyperlipidemia)   . Hypertension   . Tobacco abuse    Past Surgical History:  Procedure Laterality Date  . CORONARY/GRAFT ACUTE MI REVASCULARIZATION N/A 01/03/2018   Procedure: Coronary/Graft Acute MI Revascularization;  Surgeon: Marykay Lex, MD;  Location: [facility]; DES PCI pLAD: Synergy DES 3 x 38 (3.6) & PTCA of OstD1 (jailed)   . RIGHT/LEFT HEART CATH AND CORONARY ANGIOGRAPHY N/A 01/03/2018   Procedure: RIGHT/LEFT HEART  CATH AND CORONARY ANGIOGRAPHY;  Surgeon: Leonie Man, MD;  Location: [facility] CV [facility];  Service: Cardiovascular;;  (VF -> Ant STEMI): 100% mLAD, 80% 1st Diag & 70% Rmus Intermedius (RI) --> DES PCI pLAD & PTCA of D1.  EF~45% w/ dysentery anterior anterolateral HK.  Minimally Elevated LVEDP & PCWP & normal RHC pressures     Current Meds  Medication Sig  . aspirin 81 MG chewable tablet Chew 1 tablet (81 mg total) by mouth daily.  . carvedilol (COREG) 12.5 MG tablet Take 1 tablet (12.5 mg total) by mouth every morning AND 1.5 tablets  (18.75 mg total) at bedtime.  . chlorthalidone (HYGROTON) 25 MG tablet Take 1 tablet (25 mg total) by mouth daily.  Marland Kitchen losartan (COZAAR) 100 MG tablet Take 1 tablet (100 mg total) by mouth daily. Discontinue  Previous dose of 50 mg  . nitroGLYCERIN (NITROSTAT) 0.4 MG SL tablet Place 1 tablet (0.4 mg total) under the tongue every 5 (five) minutes as needed for chest pain.  . rosuvastatin (CRESTOR) 40 MG tablet Take 1 tablet (40 mg total) by mouth daily.  . tadalafil (CIALIS) 20 MG tablet Take 1 tab 1/2 to 1 hr prior to intercourse PRN. Do not use Nitroglycerin 24 hr prior to use or 48 hrs after use of cialis..  . ticagrelor (BRILINTA) 90 MG TABS tablet Take 1 tablet (90 mg total) by mouth 2 (two) times daily.     Allergies:   Atorvastatin   Social History   Tobacco Use  . Smoking status: Former Smoker    Packs/day: 0.10    Types: Cigarettes    Quit date: 08/25/2018    Years since quitting: 0.7  . Smokeless tobacco: Never Used  Substance Use Topics  . Alcohol use: Not Currently  . Drug use: Not Currently     Family Hx: The patient's family history includes Heart disease in his father; Hyperlipidemia in his mother.  ROS:   Please see the history of present illness.    All other systems reviewed and are negative.   Prior CV studies:   The following studies were reviewed today: Other studies Reviewed: LHC 01/2018   CULPRIT BIFURCATION LESION: Prox LAD lesion is 60% stenosed with 75% stenosed side branch in Ost 1st Diag. Prox LAD to Mid LAD lesion is 100% stenosed.  A drug-eluting stent was successfully placed (beginning proximal to diagonal branch) using a STENT SYNERGY DES 3X28. -Postdilated to 3.6 mm. Post intervention, there is a 0% residual stenosis in the LAD  Balloon angioplasty was performed on Ost 1st Diag using a BALLOON SAPPHIRE 2.0X12. Post intervention, the side branch was reduced to 20% residual  stenosis.  ----------------------------------------------------------  Ost Ramus lesion is 65% stenosed.  ----------------------------------------------------------  There is mild left ventricular systolic dysfunction. The left ventricular ejection fraction is 45-50% by visual estimate.  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  Successful PCI of LAD with DES & PTCA of ost-prox 1st Diag  EF ~45% with distal Anterior-anterolateral hypokinesis.  Minimally Elevated LVEDP & PCWP with normal RHC pressures -borderline but not true cardiogenic shock  Low Normal CO/CI (with Neosynephrine off).  Acute Respiratory Failure with Acidemia  Metabolic Acidosis - treated with 2 Amp NaHCO3  Labs/Other Tests and Data Reviewed:    EKG:  No ECG reviewed.  Recent Labs: 10/23/2018: ALT 15 04/23/2019: BUN 21; Creatinine, Ser 1.14; Potassium 4.4; Sodium 140   Recent Lipid Panel Lab Results  Component Value Date/Time   CHOL 100 01/13/2019 09:14 AM   TRIG 92 01/13/2019 09:14 AM   HDL 39 (L) 01/13/2019 09:14 AM   CHOLHDL 2.6 01/13/2019 09:14 AM   CHOLHDL 4.9 01/07/2018 11:31 AM   LDLCALC 43 01/13/2019 09:14 AM    Wt Readings from Last 3 Encounters:  05/13/19 260 lb (117.9 kg)  04/15/19 260 lb (117.9 kg)  11/11/18 260 lb (117.9 kg)     Objective:    Vital Signs:  BP 131/82   Pulse 82   Ht 5\' 10"  (1.778 m)   Wt 260 lb (117.9 kg)   BMI 37.31 kg/m    VITAL SIGNS:  reviewed Limited assessment due to telephone visit.   ASSESSMENT & PLAN:    1. Hypertension: BP is much better controlled with addition of chlorthalidone. He will be given refills on this along with coreg, and losartan. He will be seen in 3 months  for repeat labs, and assessment.  BMET.   2. CAD: Hx of LAD stenosis with PCI and DES placement and PTCA of the 1st diagonal. He will continue on DAPT until seen again by Dr. as it will be 18 months of DAPT and would need to consider stopping Brilinta or decreasing to 60 mg BID.    3. Hyperlipidemia: Goal of LDL < 70. He will need fasting lipids and LFT's on blood draw before next visit with Dr. Herbie Baltimore.  4. Obesity.: Will need to increase activity and reduce calories for better overall health.   COVID-19 Education: The signs and symptoms of COVID-19 were discussed with the patient and how to seek care for testing (follow up with PCP or arrange E-visit).  The importance of social distancing was discussed today.  Time:   Today, I have spent 15 minutes with the patient with telehealth technology discussing the above problems.     Medication Adjustments/Labs and Tests Ordered: Current medicines are reviewed at length with the patient today.  Concerns regarding medicines are outlined above.   Tests Ordered: No orders of the defined types were placed in this encounter.   Medication Changes: No orders of the defined types were placed in this encounter.   Disposition:  Follow up 3 months with Dr. Herbie Baltimore with labs prior to the appointment.   Signed, Herbie Baltimore. Bettey Mare, ANP, [facility]  05/13/2019 10:17 AM    [facility]

## 2019-05-13 ENCOUNTER — Other Ambulatory Visit: Payer: Self-pay | Admitting: Adult Health

## 2019-05-13 ENCOUNTER — Encounter: Payer: Self-pay | Admitting: Adult Health

## 2019-05-13 ENCOUNTER — Telehealth (INDEPENDENT_AMBULATORY_CARE_PROVIDER_SITE_OTHER): Payer: Self-pay | Admitting: Adult Health

## 2019-05-13 VITALS — BP 131/82 | HR 82 | Ht 70.0 in | Wt 260.0 lb

## 2019-05-13 DIAGNOSIS — I2510 Atherosclerotic heart disease of native coronary artery without angina pectoris: Secondary | ICD-10-CM

## 2019-05-13 DIAGNOSIS — Z79899 Other long term (current) drug therapy: Secondary | ICD-10-CM

## 2019-05-13 DIAGNOSIS — I10 Essential (primary) hypertension: Secondary | ICD-10-CM

## 2019-05-13 DIAGNOSIS — E785 Hyperlipidemia, unspecified: Secondary | ICD-10-CM

## 2019-05-13 MED ORDER — ROSUVASTATIN CALCIUM 40 MG PO TABS: 40.0000 mg | ORAL_TABLET | Freq: Every day | ORAL | 3 refills | Status: DC

## 2019-05-13 MED ORDER — CARVEDILOL 12.5 MG PO TABS: ORAL_TABLET | ORAL | 3 refills | Status: DC

## 2019-05-13 MED ORDER — LOSARTAN POTASSIUM 100 MG PO TABS: 100.0000 mg | ORAL_TABLET | Freq: Every day | ORAL | 3 refills | Status: DC

## 2019-05-13 MED ORDER — TICAGRELOR 90 MG PO TABS: 90.0000 mg | ORAL_TABLET | Freq: Two times a day (BID) | ORAL | 3 refills | Status: DC

## 2019-05-13 MED ORDER — CHLORTHALIDONE 25 MG PO TABS: 25.0000 mg | ORAL_TABLET | Freq: Every day | ORAL | 3 refills | Status: DC

## 2019-05-13 NOTE — Patient Instructions (Signed)
Medication Instructions:  Continue current medications  *If you need a refill on your cardiac medications before your next appointment, please call your pharmacy*   Lab Work: CBC, BMP, Fasting Lipid and Liver  If you have labs (blood work) drawn today and your tests are completely normal, you will receive your results only by: . MyChart Message (if you have MyChart) OR . A paper copy in the mail If you have any lab test that is abnormal or we need to change your treatment, we will call you to review the results.   Testing/Procedures: None Ordered   Follow-Up: At [facility], you and your health needs are our priority.  As part of our continuing mission to provide you with exceptional heart care, we have created designated Provider Care Teams.  These Care Teams include your primary Cardiologist (physician) and Advanced Practice Providers (APPs -  Physician Assistants and Nurse Practitioners) who all work together to provide you with the care you need, when you need it.  We recommend signing up for the patient portal called "MyChart".  Sign up information is provided on this After Visit Summary.  MyChart is used to connect with patients for Virtual Visits (Telemedicine).  Patients are able to view lab/test results, encounter notes, upcoming appointments, etc.  Non-urgent messages can be sent to your provider as well.   To learn more about what you can do with MyChart, go to [email or web address].    Your next appointment:   3 month(s)  The format for your next appointment:   In Person  Provider:   You may see David Harding, MD or one of the following Advanced Practice Providers on your designated Care Team:    Rhonda Barrett, PA-C  Kathryn Lawrence, DNP, ANP  Cadence Furth, NP      

## 2019-05-14 ENCOUNTER — Telehealth: Payer: Self-pay | Admitting: Cardiology

## 2019-05-14 DIAGNOSIS — I2510 Atherosclerotic heart disease of native coronary artery without angina pectoris: Secondary | ICD-10-CM

## 2019-05-14 NOTE — Telephone Encounter (Signed)
Will route to Dr. Herbie Baltimore for approval to order stress test and see which kind he would like to do?

## 2019-05-14 NOTE — Telephone Encounter (Signed)
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

## 2019-05-14 NOTE — Telephone Encounter (Signed)
LVM for return call. 

## 2019-05-14 NOTE — Telephone Encounter (Signed)
His last note with Joni Reining, MD indicated that he was not want to do a stress test at this time.  He was looking for another job.  I am probably fine with doing a GXT if he is now looking to go back to driving.Bryan Lemma, MD

## 2019-05-19 NOTE — Telephone Encounter (Signed)
Spoke with pt, he does still need the GXT testing. He is aware he will need covid testing prior to testing. Order placed for someone to reach out and schedule the patient.

## 2019-06-02 MED FILL — ?TADALAFIL (PAH) 20 MG TAB: 20 | 30 days supply | Qty: 10 | Fill #0

## 2019-06-02 MED FILL — CARVEDILOL 12.5 MG TABLET: 12.5 | 90 days supply | Qty: 225 | Fill #0

## 2019-06-02 MED FILL — !BRILINTA 90 MG TABLET: 30 days supply | Qty: 60 | Fill #2

## 2019-06-03 ENCOUNTER — Other Ambulatory Visit: Payer: Self-pay | Admitting: Cardiology

## 2019-06-03 MED ORDER — TICAGRELOR 90 MG PO TABS: 90.0000 mg | ORAL_TABLET | Freq: Two times a day (BID) | ORAL | 3 refills | Status: DC

## 2019-06-03 MED FILL — CHLORTHALIDONE 25 MG TAB: 25 | 90 days supply | Qty: 90 | Fill #2

## 2019-06-03 MED FILL — LOSARTAN POTASSIUM 100 MG T: 100 | 90 days supply | Qty: 90 | Fill #7

## 2019-06-03 NOTE — Telephone Encounter (Signed)
*  STAT* If patient is at the pharmacy, call can be transferred to refill team.   1. Which medications need to be refilled? (please list name of each medication and dose if known)  ticagrelor (BRILINTA) 90 MG TABS tablet  2. Which pharmacy/location (including street and city if local pharmacy) is medication to be sent to? [facility] - [place], [place] - [address]  3. Do they need a 30 day or 90 day supply? 90 day supply

## 2019-06-09 LAB — LIPID PANEL
Chol/HDL Ratio: 3.7 ratio (ref 0.0–5.0)
Cholesterol, Total: 118 mg/dL (ref 100–199)
HDL: 32 mg/dL — ABNORMAL LOW (ref >39)
LDL Chol Calc (NIH): 55 mg/dL (ref 0–99)
Triglycerides: 183 mg/dL — ABNORMAL HIGH (ref 0–149)
VLDL Cholesterol Cal: 31 mg/dL (ref 5–40)

## 2019-06-09 LAB — BASIC METABOLIC PANEL
BUN/Creatinine Ratio: 19 (ref 9–20)
BUN: 23 mg/dL (ref 6–24)
CO2: 21 mmol/L (ref 20–29)
Calcium: 9.3 mg/dL (ref 8.7–10.2)
Chloride: 100 mmol/L (ref 96–106)
Creatinine, Ser: 1.19 mg/dL (ref 0.76–1.27)
GFR calc Af Amer: 82 mL/min/{1.73_m2} (ref >59)
GFR calc non Af Amer: 71 mL/min/{1.73_m2} (ref >59)
Glucose: 103 mg/dL — ABNORMAL HIGH (ref 65–99)
Potassium: 4.4 mmol/L (ref 3.5–5.2)
Sodium: 137 mmol/L (ref 134–144)

## 2019-06-09 LAB — CBC
Hematocrit: 34.1 % — ABNORMAL LOW (ref 37.5–51.0)
Hemoglobin: 11.6 g/dL — ABNORMAL LOW (ref 13.0–17.7)
MCH: 28.4 pg (ref 26.6–33.0)
MCHC: 34 g/dL (ref 31.5–35.7)
MCV: 84 fL (ref 79–97)
Platelets: 202 10*3/uL (ref 150–450)
RBC: 4.08 x10E6/uL — ABNORMAL LOW (ref 4.14–5.80)
RDW: 13.6 % (ref 11.6–15.4)
WBC: 8.6 10*3/uL (ref 3.4–10.8)

## 2019-06-09 LAB — HEPATIC FUNCTION PANEL
ALT: 15 IU/L (ref 0–44)
AST: 19 IU/L (ref 0–40)
Albumin: 4.2 g/dL (ref 4.0–5.0)
Alkaline Phosphatase: 74 IU/L (ref 39–117)
Bilirubin Total: 0.2 mg/dL (ref 0.0–1.2)
Bilirubin, Direct: 0.09 mg/dL (ref 0.00–0.40)
Total Protein: 6.9 g/dL (ref 6.0–8.5)

## 2019-06-22 ENCOUNTER — Other Ambulatory Visit: Payer: Self-pay | Admitting: Internal Medicine

## 2019-06-23 NOTE — Telephone Encounter (Signed)
PCE patient.  

## 2019-06-28 MED FILL — !BRILINTA 90 MG TABLET: 30 days supply | Qty: 60 | Fill #3

## 2019-06-30 MED FILL — ?TADALAFIL (PAH) 20 MG TAB: 20 | 30 days supply | Qty: 10 | Fill #0

## 2019-07-08 ENCOUNTER — Encounter (HOSPITAL_COMMUNITY): Payer: Self-pay | Admitting: Emergency Medicine

## 2019-07-08 ENCOUNTER — Emergency Department (HOSPITAL_COMMUNITY)
Admission: EM | Admit: 2019-07-08 | Discharge: 2019-07-08 | Disposition: A | Discharge status: Home / Self Care | Payer: Self-pay | Attending: Emergency Medicine | Admitting: Emergency Medicine

## 2019-07-08 ENCOUNTER — Other Ambulatory Visit: Payer: Self-pay

## 2019-07-08 DIAGNOSIS — Z7982 Long term (current) use of aspirin: Secondary | ICD-10-CM | POA: Insufficient documentation

## 2019-07-08 DIAGNOSIS — I2510 Atherosclerotic heart disease of native coronary artery without angina pectoris: Secondary | ICD-10-CM | POA: Insufficient documentation

## 2019-07-08 DIAGNOSIS — H00014 Hordeolum externum left upper eyelid: Secondary | ICD-10-CM | POA: Insufficient documentation

## 2019-07-08 DIAGNOSIS — I252 Old myocardial infarction: Secondary | ICD-10-CM | POA: Insufficient documentation

## 2019-07-08 DIAGNOSIS — Z79899 Other long term (current) drug therapy: Secondary | ICD-10-CM | POA: Insufficient documentation

## 2019-07-08 DIAGNOSIS — I10 Essential (primary) hypertension: Secondary | ICD-10-CM | POA: Insufficient documentation

## 2019-07-08 DIAGNOSIS — Z87891 Personal history of nicotine dependence: Secondary | ICD-10-CM | POA: Insufficient documentation

## 2019-07-08 MED ORDER — TETRACAINE HCL 0.5 % OP SOLN
1.0000 [drp] | Freq: Once | OPHTHALMIC | Status: AC
  Administered 2019-07-08: 09:00:00 1 [drp] via OPHTHALMIC
  Filled 2019-07-08: qty 4

## 2019-07-08 MED ORDER — ERYTHROMYCIN 5 MG/GM OP OINT
TOPICAL_OINTMENT | OPHTHALMIC | 0 refills | Status: DC
Start: 2019-07-08 — End: 2021-05-15

## 2019-07-08 MED ORDER — FLUORESCEIN SODIUM 1 MG OP STRP
1.0000 | ORAL_STRIP | Freq: Once | OPHTHALMIC | Status: AC
  Administered 2019-07-08: 1 via OPHTHALMIC
  Filled 2019-07-08: qty 1

## 2019-07-08 MED FILL — ERYTHROMYCIN EYE OINTMENT: 5 | 7 days supply | Qty: 4 | Fill #0

## 2019-07-08 NOTE — Discharge Instructions (Signed)
Please use warm compresses at least twice daily until the hordeolum/stye resolves.  If this continues to be an issue follow-up with ophthalmology--they may consider other treatments.   I have also provided you with an antibiotic ointment to use.
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
## 2019-07-08 NOTE — ED Provider Notes (Addendum)
[facility] EMERGENCY DEPARTMENT Provider Note   CSN: [id number] Arrival date & time: 07/08/19  0005     History Chief Complaint  Patient presents with  . Eye Pain    Rodney Lloyd is a 49 y.o. male.  HPI Is included patient is a 48 year old male with past medical history of stye of the right eye presented today with left upper eyelid swelling and was itchy, uncomfortable eye the left eye.  Patient states he did some lawnmowing on Sunday and states that his eyes were itchy afterwards and he rubbed his eye frequently.  He states that Monday he was driving when he noticed that he had some swelling to his left upper eyelid which he noticed when he was rubbing his eye.  He states that he felt like he had something in his eye.  He states that when he was mowing the lawn he was wearing his normal glasses however states that these did not wraparound and he was concerned that he might have a foreign body in his eye.  He denies any discharge from his eye, vision changes or headaches.  Denies any history of glaucoma.  Denies any history of welding or snow sports.  Denies any photosensitivity.  Denies any nausea or vomiting.  Has no history of glaucoma.      Past Medical History:  Diagnosis Date  . Acute ST elevation myocardial infarction (STEMI) involving left anterior descending (LAD) coronary artery (HCC) 01/04/2019   Post VF Arrest (cocaine +) --> 100% p-mLAD after D1 (pLAD 60% & Ost Diag 75%) --> DES PCI to LAD & PTCA of ~small D1.  EF back up to 55-60% by Echo on d/c - distal anterior HK.  Marland Kitchen Alcohol abuse   . CAD (coronary artery disease), native coronary artery    s/p DES PCI to LAD 01/2018 (Synergy DES 3 x 28 -- 3.6) acoss D1 with PTCA of D1.   . Cocaine abuse (HCC)   . HLD (hyperlipidemia)   . Hypertension   . Tobacco abuse     Patient Active Problem List   Diagnosis Date Noted  . Former smoker 01/19/2019  . Erectile dysfunction of organic origin 11/11/2018    . Palpitations 06/08/2018  . Hyperlipidemia with target low density lipoprotein (LDL) cholesterol less than 70 mg/dL 61/47/0929  . Cardiac arrest due to underlying cardiac condition (HCC) 01/03/2018  . h/o Acute ST elevation myocardial infarction (STEMI) of anterolateral wall (HCC) 01/03/2018  . Cocaine abuse (HCC) 01/03/2018  . Presence of drug coated stent in LAD coronary artery 01/03/2018  . Coronary artery disease involving native coronary artery without angina pectoris 01/03/2018  . Essential hypertension 02/15/2015  . Tobacco abuse 02/15/2015    Past Surgical History:  Procedure Laterality Date  . CORONARY/GRAFT ACUTE MI REVASCULARIZATION N/A 01/03/2018   Procedure: Coronary/Graft Acute MI Revascularization;  Surgeon: Marykay Lex, MD;  Location: [facility] INVASIVE CV LAB; DES PCI pLAD: Synergy DES 3 x 38 (3.6) & PTCA of OstD1 (jailed)   . RIGHT/LEFT HEART CATH AND CORONARY ANGIOGRAPHY N/A 01/03/2018   Procedure: RIGHT/LEFT HEART CATH AND CORONARY ANGIOGRAPHY;  Surgeon: Marykay Lex, MD;  Location: [facility] INVASIVE CV LAB;  Service: Cardiovascular;;  (VF -> Ant STEMI): 100% mLAD, 80% 1st Diag & 70% Rmus Intermedius (RI) --> DES PCI pLAD & PTCA of D1.  EF~45% w/ dysentery anterior anterolateral HK.  Minimally Elevated LVEDP & PCWP & normal RHC pressures       Family History  Problem  Relation Age of Onset  . Hyperlipidemia Mother   . Heart disease Father     Social History   Tobacco Use  . Smoking status: Former Smoker    Packs/day: 0.10    Types: Cigarettes    Quit date: 08/25/2018    Years since quitting: 0.8  . Smokeless tobacco: Never Used  Substance Use Topics  . Alcohol use: Not Currently  . Drug use: Not Currently    Home Medications Prior to Admission medications   Medication Sig Start Date End Date Taking? Authorizing Provider  aspirin 81 MG chewable tablet Chew 1 tablet (81 mg total) by mouth daily. 03/03/18   Scot Jun, FNP  carvedilol (COREG) 12.5 MG  tablet Take 1 tablet (12.5 mg total) by mouth every morning AND 1.5 tablets (18.75 mg total) at bedtime. 05/13/19   Lendon Colonel, NP  chlorthalidone (HYGROTON) 25 MG tablet Take 1 tablet (25 mg total) by mouth daily. 05/13/19   Lendon Colonel, NP  erythromycin ophthalmic ointment Place a 1/2 inch ribbon of ointment into the lower eyelid. (left eye) for the next 1 week. 07/08/19   Tedd Sias, PA  losartan (COZAAR) 100 MG tablet Take 1 tablet (100 mg total) by mouth daily. Discontinue  Previous dose of 50 mg 05/13/19 08/11/19  Lendon Colonel, NP  nitroGLYCERIN (NITROSTAT) 0.4 MG SL tablet Place 1 tablet (0.4 mg total) under the tongue every 5 (five) minutes as needed for chest pain. 03/03/18   Scot Jun, FNP  rosuvastatin (CRESTOR) 40 MG tablet Take 1 tablet (40 mg total) by mouth daily. 05/13/19 08/11/19  Lendon Colonel, NP  tadalafil (CIALIS) 20 MG tablet Take 1 tab 1/2 to 1 hr prior to intercourse PRN. Do not use Nitroglycerin 24 hr prior to use or 48 hrs after use of cialis.Marland Kitchen 01/19/19   Ladell Pier, MD  tadalafil, PAH, (ADCIRCA) 20 MG tablet TAKE 1/2 TO 1 TABLET BY MOUTH 1 HOUR PRIOR TO INTERCOURSE AS NEEDED. DO NOT USE NITROGLYCERIN 24HR PRIOR TO USE OR 48HR AFTER USE OF CIALIS 06/23/19   Nicolette Bang, DO  ticagrelor (BRILINTA) 90 MG TABS tablet Take 1 tablet (90 mg total) by mouth 2 (two) times daily. 06/03/19   Leonie Man, MD    Allergies    Atorvastatin  Review of Systems   Review of Systems  Constitutional: Negative for chills and fever.  HENT: Negative for congestion.   Eyes: Positive for pain and redness. Negative for visual disturbance.  Respiratory: Negative for shortness of breath.   Cardiovascular: Negative for chest pain.  Gastrointestinal: Negative for abdominal pain.  Musculoskeletal: Negative for neck pain.    Physical Exam Updated Vital Signs BP 120/74   Pulse 81   Temp 98.4 F (36.9 C)   Resp 17   Ht 5\' 10"  (1.778 m)    Wt 117.9 kg   SpO2 99%   BMI 37.31 kg/m   Physical Exam Vitals and nursing note reviewed.  Constitutional:      General: He is not in acute distress.    Appearance: Normal appearance. He is not ill-appearing.  HENT:     Head: Normocephalic and atraumatic.  Eyes:     General: No scleral icterus.       Right eye: No discharge.        Left eye: No discharge.     Extraocular Movements: Extraocular movements intact.     Conjunctiva/sclera: Conjunctivae normal.     Pupils:  Pupils are equal, round, and reactive to light.     Comments: Left eye with scleral injection.  PERRLA, EOMI.  No ophthalmoplegia or proptosis.  Wood lamp evaluation yields no corneal abrasion.  Eyelids everted upper and lower without any foreign body found.  Globes are bilaterally soft.  Left eyelide (upper) is swollen  Pulmonary:     Effort: Pulmonary effort is normal.     Breath sounds: No stridor.  Neurological:     Mental Status: He is alert and oriented to person, place, and time. Mental status is at baseline.     ED Results / Procedures / Treatments   Labs (all labs ordered are listed, but only abnormal results are displayed) Labs Reviewed - No data to display  EKG None  Radiology No results found.  Procedures Procedures (including critical care time)  Medications Ordered in ED Medications  tetracaine (PONTOCAINE) 0.5 % ophthalmic solution 1 drop (1 drop Left Eye Given 07/08/19 0831)  fluorescein ophthalmic strip 1 strip (1 strip Both Eyes Given 07/08/19 0831)    ED Course  I have reviewed the triage vital signs and the nursing notes.  Pertinent labs & imaging results that were available during my care of the patient were reviewed by me and considered in my medical decision making (see chart for details).    MDM Rules/Calculators/A&P                      Patient is a 49 year old with past medical history detailed above.  Patient with stye of the left upper eyelid since "Sunday.   The  emergent differential diagnosis for acute eye pain includes, but is not limited to ocular ischemia, optic neuritis, temporal arteritis, Sinusitis, neuralgia, Migraine, Acute angle closure glaucoma, eye trauma, uveitis, iritis, corneal abrasion/ulceration, and photokeratitis.  Given the patient's symptoms today primary concerns are corneal abrasion versus simple stye without corneal abrasion.  My physical exam yielded no evidence of corneal abrasion with Woods lamp exam.  Patient is not a contact lens wearer nor is he diabetic.  Patient provided erythromycin ointment and recommendations for warm compresses.  He was also provided a ophthalmologist to follow-up with this his symptoms do not improve  Doubt acute medical condition/emergent condition today.  He was given strict return precautions however.  Final Clinical Impression(s) / ED Diagnoses Final diagnoses:  Hordeolum externum of left upper eyelid    Rx / DC Orders ED Discharge Orders         Ordered    erythromycin ophthalmic ointment     05" /06/21 0820           08-22-1988, PA 07/08/19 0832    09/07/19, PA 07/08/19 09/07/19    0737, MD 07/08/19 1807

## 2019-08-03 MED FILL — ROSUVASTATIN CALCIUM 40 MG: 40 | 90 days supply | Qty: 90 | Fill #3

## 2019-08-04 ENCOUNTER — Encounter (HOSPITAL_COMMUNITY): Payer: Self-pay

## 2019-08-04 MED FILL — BRILINTA 90 MG TABLET: 90 | 30 days supply | Qty: 60 | Fill #4

## 2019-08-05 ENCOUNTER — Telehealth (HOSPITAL_COMMUNITY): Payer: Self-pay

## 2019-08-05 NOTE — Telephone Encounter (Signed)
Encounter complete. 

## 2019-08-07 ENCOUNTER — Other Ambulatory Visit (HOSPITAL_COMMUNITY)
Admission: RE | Admit: 2019-08-07 | Discharge: 2019-08-07 | Disposition: A | Discharge status: Home / Self Care | Payer: HRSA Program | Source: Ambulatory Visit | Attending: Cardiology | Admitting: Cardiology

## 2019-08-07 DIAGNOSIS — Z01812 Encounter for preprocedural laboratory examination: Secondary | ICD-10-CM | POA: Diagnosis present

## 2019-08-07 DIAGNOSIS — Z20822 Contact with and (suspected) exposure to covid-19: Secondary | ICD-10-CM | POA: Diagnosis not present

## 2019-08-07 LAB — SARS CORONAVIRUS 2 (TAT 6-24 HRS): SARS Coronavirus 2: NEGATIVE

## 2019-08-11 ENCOUNTER — Ambulatory Visit (HOSPITAL_COMMUNITY)
Admission: RE | Admit: 2019-08-11 | Discharge: 2019-08-11 | Disposition: A | Discharge status: Home / Self Care | Payer: Self-pay | Source: Ambulatory Visit | Attending: Internal Medicine | Admitting: Internal Medicine

## 2019-08-11 ENCOUNTER — Other Ambulatory Visit: Payer: Self-pay

## 2019-08-11 DIAGNOSIS — I2510 Atherosclerotic heart disease of native coronary artery without angina pectoris: Secondary | ICD-10-CM | POA: Insufficient documentation

## 2019-08-11 LAB — EXERCISE TOLERANCE TEST
Estimated workload: 8.6 METS
Exercise duration (min): 7 min
Exercise duration (sec): 3 s
MPHR: 171 {beats}/min
Peak HR: 151 {beats}/min
Percent HR: 88 %
Rest HR: 82 {beats}/min

## 2019-08-16 ENCOUNTER — Telehealth: Payer: Self-pay | Admitting: Urology

## 2019-08-16 ENCOUNTER — Telehealth: Payer: Self-pay | Admitting: *Deleted

## 2019-08-16 NOTE — Telephone Encounter (Signed)
The patient has been notified of the result and verbalized understanding.  All questions (if any) were answered.  Patient states he has letter  to drop by the office for Dr Herbie Baltimore. 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 Patient voiced understanding. Tobin Chad, RN 08/16/2019 3:13 PM

## 2019-08-16 NOTE — Telephone Encounter (Signed)
-----   Message from Marykay Lex, MD sent at 08/13/2019  7:17 PM EDT ----- Randie Heinz news-treadmill stress test was low risk/negative.  7 minutes of exercise achieving 8.6 metabolic equivalents.  No EKG changes or symptoms to suggest symptomatic heart artery blockages.  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  Bryan Lemma, MD

## 2019-08-16 NOTE — Telephone Encounter (Signed)
Pt left vm stating he needs [facility]ill [facility] [place]. Did not state which medication.

## 2019-08-31 ENCOUNTER — Other Ambulatory Visit: Payer: Self-pay | Admitting: Cardiology

## 2019-08-31 DIAGNOSIS — E785 Hyperlipidemia, unspecified: Secondary | ICD-10-CM

## 2019-08-31 MED ORDER — ROSUVASTATIN CALCIUM 40 MG PO TABS: 40.0000 mg | ORAL_TABLET | Freq: Every day | ORAL | 1 refills | Status: DC

## 2019-08-31 MED ORDER — CARVEDILOL 12.5 MG PO TABS: ORAL_TABLET | ORAL | 3 refills | Status: DC

## 2019-08-31 MED ORDER — TICAGRELOR 90 MG PO TABS: 90.0000 mg | ORAL_TABLET | Freq: Two times a day (BID) | ORAL | 1 refills | Status: DC

## 2019-08-31 MED ORDER — CHLORTHALIDONE 25 MG PO TABS: 25.0000 mg | ORAL_TABLET | Freq: Every day | ORAL | 1 refills | Status: DC

## 2019-08-31 MED ORDER — LOSARTAN POTASSIUM 100 MG PO TABS: 100.0000 mg | ORAL_TABLET | Freq: Every day | ORAL | 1 refills | Status: DC

## 2019-08-31 MED FILL — ?CARVEDILOL 12.5 MG TABLET: 12.5 | 30 days supply | Qty: 75 | Fill #0

## 2019-08-31 MED FILL — LOSARTAN POTASSIUM 100 MG T: 100 | 30 days supply | Qty: 30 | Fill #0

## 2019-08-31 MED FILL — ?CHLORTHALIDONE 25 MG TABLE: 25 | 30 days supply | Qty: 30 | Fill #0

## 2019-08-31 NOTE — Telephone Encounter (Signed)
*  STAT* If patient is at the pharmacy, call can be transferred to refill team.   1. Which medications need to be refilled? (please list name of each medication and dose if known) carvedilol (COREG) 12.5 MG tablet / chlorthalidone (HYGROTON) 25 MG tablet / losartan (COZAAR) 100 MG tablet(Expired) / rosuvastatin (CRESTOR) 40 MG tablet(Expired) / ticagrelor (BRILINTA) 90 MG TABS tablet  2. Which pharmacy/location (including street and city if local pharmacy) is medication to be sent to? [facility] - [place], [place] - [address]  3. Do they need a 30 day or 90 day supply? 90

## 2019-09-02 ENCOUNTER — Ambulatory Visit: Payer: Self-pay | Admitting: Cardiology

## 2019-09-07 MED FILL — BRILINTA 90 MG TABLET: 90 | 30 days supply | Qty: 60 | Fill #5

## 2019-10-04 ENCOUNTER — Encounter: Payer: Self-pay | Admitting: Cardiology

## 2019-10-04 ENCOUNTER — Other Ambulatory Visit: Payer: Self-pay | Admitting: *Deleted

## 2019-10-04 ENCOUNTER — Other Ambulatory Visit: Payer: Self-pay

## 2019-10-04 ENCOUNTER — Ambulatory Visit (INDEPENDENT_AMBULATORY_CARE_PROVIDER_SITE_OTHER): Payer: No Typology Code available for payment source | Admitting: Cardiology

## 2019-10-04 VITALS — BP 120/72 | HR 79 | Ht 70.0 in | Wt 254.2 lb

## 2019-10-04 DIAGNOSIS — Z0289 Encounter for other administrative examinations: Secondary | ICD-10-CM

## 2019-10-04 DIAGNOSIS — I2510 Atherosclerotic heart disease of native coronary artery without angina pectoris: Secondary | ICD-10-CM

## 2019-10-04 DIAGNOSIS — I2109 ST elevation (STEMI) myocardial infarction involving other coronary artery of anterior wall: Secondary | ICD-10-CM

## 2019-10-04 DIAGNOSIS — I10 Essential (primary) hypertension: Secondary | ICD-10-CM

## 2019-10-04 DIAGNOSIS — Z955 Presence of coronary angioplasty implant and graft: Secondary | ICD-10-CM

## 2019-10-04 DIAGNOSIS — Z87891 Personal history of nicotine dependence: Secondary | ICD-10-CM

## 2019-10-04 DIAGNOSIS — R002 Palpitations: Secondary | ICD-10-CM

## 2019-10-04 DIAGNOSIS — E785 Hyperlipidemia, unspecified: Secondary | ICD-10-CM

## 2019-10-04 MED ORDER — CLOPIDOGREL BISULFATE 75 MG PO TABS
75.0000 mg | ORAL_TABLET | Freq: Every day | ORAL | 3 refills | Status: DC
Start: 2019-10-04 — End: 2019-10-04

## 2019-10-04 MED FILL — ?CLOPIDOGREL 75MG TA: 75 | 30 days supply | Qty: 30 | Fill #0

## 2019-10-04 NOTE — Patient Instructions (Addendum)
°  Medication Instructions:  MAY STOP TAKING ASPIRIN 81 MG STOP BRILINTA  START TAKING  PLAVIX ( CLOPIDOGREL ) 75 MG ONE TABLET DAILY   OK TO USE TADALAFIL ( CIALIS)    *If you need a refill on your cardiac medications before your next appointment, please call your pharmacy*   Lab Work: IN NOV 2021 - FASTING -- LIPID , CMP  If you have labs (blood work) drawn today and your tests are completely normal, you will receive your results only by:  MyChart Message (if you have MyChart) OR  A paper copy in the mail If you have any lab test that is abnormal or we need to change your treatment, we will call you to review the results.   Testing/Procedures: NOT NEEDED   Follow-Up: At [facility], you and your health needs are our priority.  As part of our continuing mission to provide you with exceptional heart care, we have created designated Provider Care Teams.  These Care Teams include your primary Cardiologist (physician) and Advanced Practice Providers (APPs -  Physician Assistants and Nurse Practitioners) who all work together to provide you with the care you need, when you need it.  We recommend signing up for the patient portal called "MyChart".  Sign up information is provided on this After Visit Summary.  MyChart is used to connect with patients for Virtual Visits (Telemedicine).  Patients are able to view lab/test results, encounter notes, upcoming appointments, etc.  Non-urgent messages can be sent to your provider as well.   To learn more about what you can do with MyChart, go to [email or web address].    Your next appointment:   12 month(s) AUG 2022  The format for your next appointment:   In Person  Provider:   Bryan Lemma, MD   Other Instructions GXT: Exercised 7:03 min. 8.6 METS - Peak HR 151 bpm = 88% MPHR of 171 bpm. Normal BP & HR response to exercise. No ischemic EKG changes with no CP.  - Negative, adequate ETT. (could not walk fast enough to continue due to  Knee pain) --OK for DOT physical

## 2019-10-04 NOTE — Progress Notes (Signed)
Primary Care Provider: Bing Neighbors, FNP Cardiologist: Bryan Lemma, MD Electrophysiologist: None  Clinic Note:  Chief Complaint  Patient presents with  . Follow-up    ETT/GXT results-for DOT physical.  . Coronary Artery Disease    History of anterior MI presenting as VF arrest with prolonged CPR restoring ROSC.  LAD PCI.  Marland Kitchen Hyperlipidemia    HPI:    Rodney Lloyd is a 49 y.o. male with a PMH notable for Ant STEMI (01/2018 -VF arrest with bystander CPR followed by [employer] and then EMS-3 shocks prior to initial ROSC and then 1 more for recurrence; PCI prox-mid LAD with PTCA of D1, 65% prox RI, EF 45-50%) who presents today for 4 month f/u & after recent ETT as part of DOT Physical Evaluation.   I saw him via Telemedicine in Sept 2020 --> was doing quite well.  Social Distancing -- employer went out of business 2/2 COVID Lock down.  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 -> No more Drugs -- "DONE".  ~2.5 months without a cigarette. -> increased Losartan to 100 mg; converted from atorvastatin to rosuvastatin (due to myalgias).   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  His BP was better after having started Chlorthalidone.  No CP or dyspnea @ rest or with exertion.  Recent Hospitalizations: n/a  Reviewed  CV studies:    The following studies were reviewed today: (if available, images/films reviewed: From Epic Chart or Care Everywhere) . GXT/ETT (August 11, 2019): Exercised 7:03 min. 8.6 METS - Peak HR 151 bpm = 88% MPHR of 171 bpm. Normal BP & HR response to exercise. No ischemic EKG changes with no CP.  - Negative, adequate ETT. (could not walk fast enough to continue due to Knee pain)   Interval History:   Rodney PROUT is here today to discuss  results of his treadmill stress test and to get clearance for his DOT physical.  This is actually his first in person visit with me.  He did great on the treadmill with no chest pain no ischemic EKG changes.  He said he probably could have walked longer but he was in need of started bothering him.  He felt like he was and had to run if he were to go any further.  He had no chest pain.  He has been very active with trying to increase his exercise level.  He is also made a conscious effort to adjust his diet.  Was happy to see that he is lost weight since last visit.  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  When he reflects back on his MI event, he says it was a eye-opening experience that made him realize that he was not doing the job taking care of himself.  Thankfully he is not having any further anginal pain or any heart failure symptoms.  He still is adamant about staying away from any illicit drugs.  He is also still happy to say that he has not had a cigarette.  CV Review of Symptoms (Summary) Cardiovascular ROS: no chest pain or dyspnea on exertion positive for - Occasional skipped beats that happen very rarely.  Mild end of day swelling that goes  away when he puts his feet up.  Nothing that bothers him very much. negative for - irregular heartbeat, orthopnea, paroxysmal nocturnal dyspnea, rapid heart rate, shortness of breath or Lightheadedness, dizziness, syncope/near syncope or TIA/amaurosis fugax.  Claudication  The patient does not have symptoms concerning for COVID-19 infection (fever, chills, cough, or new shortness of breath).  The patient is practicing social distancing & Masking.   ++ Covid Vaccine  REVIEWED OF SYSTEMS   Review of Systems  Constitutional: Positive for weight loss (Intentional). Negative for malaise/fatigue.  HENT: Negative for nosebleeds.   Respiratory: Negative for cough and shortness of breath.   Gastrointestinal: Negative for  blood in stool and melena.  Genitourinary: Negative for hematuria.  Musculoskeletal: Negative for joint pain and myalgias.  Neurological: Negative for dizziness, focal weakness and headaches.  Psychiatric/Behavioral: Negative for memory loss. The patient is not nervous/anxious and does not have insomnia.    I have reviewed and (if needed) personally updated the patient's problem list, medications, allergies, past medical and surgical history, social and family history.   PAST MEDICAL HISTORY   Past Medical History:  Diagnosis Date  . Acute ST elevation myocardial infarction (STEMI) involving left anterior descending (LAD) coronary artery (HCC) 01/04/2019   Post VF Arrest (cocaine +) --> 100% p-mLAD after D1 (pLAD 60% & Ost Diag 75%) --> DES PCI to LAD & PTCA of ~small D1.  EF back up to 55-60% by Echo on d/c - distal anterior HK.  Marland Kitchen Alcohol abuse   . CAD (coronary artery disease), native coronary artery    s/p DES PCI to LAD 01/2018 (Synergy DES 3 x 28 -- 3.6) acoss D1 with PTCA of jailed  D1.   . Cocaine abuse (HCC)   . HLD (hyperlipidemia)   . Hypertension   . Tobacco abuse     PAST SURGICAL HISTORY   Past Surgical History:  Procedure Laterality Date  . CORONARY/GRAFT ACUTE MI REVASCULARIZATION N/A 01/03/2018   Procedure: Coronary/Graft Acute MI Revascularization- CORONARY STENT INTERVENTION;  Surgeon: Marykay Lex, MD;  Location: [facility] INVASIVE CV LAB; DES PCI pLAD: Synergy DES 3 x 28 (3.6) & PTCA of OstD1 (jailed)   . RIGHT/LEFT HEART CATH AND CORONARY ANGIOGRAPHY N/A 01/03/2018   Procedure: RIGHT/LEFT HEART CATH AND CORONARY ANGIOGRAPHY;  Surgeon: Marykay Lex, MD;  Location: [facility] INVASIVE CV LAB;  Service: Cardiovascular;;  (VF -> Ant STEMI): 100% mLAD, 80% 1st Diag & 65%ost RI --> DES PCI p-mLAD & PTCA of D1.  EF~45% w/ anterior anterolateral HK.  Minimally Elevated LVEDP & PCWP & normal RHC pressures  . TRANSTHORACIC ECHOCARDIOGRAM  01/04/2018   (following Ant STEMI - VF  arrest):  EF 55-60%. Mild HK of apical anterior wall c/w LAD ischemia. Gr 1 DD. Normal valves & chamber sizes.      Post Intervention 01/03/2018: Synergy DES 3.0 mm x 28 mm--3.6 mm; PTA of D1 ostium.  Medical management for RI.   EF 45% with distal anterior-anterolateral HK.  Minimally elevated LVEDP with borderline RHC pressures.   MEDICATIONS/ALLERGIES   Current Meds  Medication Sig  . carvedilol (COREG) 12.5 MG tablet Take 1 tablet (12.5 mg total) by mouth every morning AND 1.5 tablets (18.75 mg total) at bedtime.  . chlorthalidone (HYGROTON) 25 MG tablet Take 1 tablet (25 mg total) by mouth daily.  Marland Kitchen erythromycin ophthalmic ointment Place a 1/2 inch ribbon of ointment into the lower eyelid. (left eye) for the next 1 week.  . losartan (COZAAR) 100 MG tablet  Take 1 tablet (100 mg total) by mouth daily. Discontinue  Previous dose of 50 mg  . nitroGLYCERIN (NITROSTAT) 0.4 MG SL tablet Place 1 tablet (0.4 mg total) under the tongue every 5 (five) minutes as needed for chest pain.  . rosuvastatin (CRESTOR) 40 MG tablet Take 1 tablet (40 mg total) by mouth daily.  . tadalafil (CIALIS) 20 MG tablet Take 1 tab 1/2 to 1 hr prior to intercourse PRN. Do not use Nitroglycerin 24 hr prior to use or 48 hrs after use of cialis..  . tadalafil, PAH, (ADCIRCA) 20 MG tablet TAKE 1/2 TO 1 TABLET BY MOUTH 1 HOUR PRIOR TO INTERCOURSE AS NEEDED. DO NOT USE NITROGLYCERIN 24HR PRIOR TO USE OR 48HR AFTER USE OF CIALIS  . [DISCONTINUED] aspirin 81 MG chewable tablet Chew 1 tablet (81 mg total) by mouth daily.  . [DISCONTINUED] ticagrelor (BRILINTA) 90 MG TABS tablet Take 1 tablet (90 mg total) by mouth 2 (two) times daily.    Allergies  Allergen Reactions  . 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 He has tried to make a pretty conscious effort to adjust his diet and is trying to increase his exercise well.  -> He is about to need new refills for Brilinta and will need the paperwork refilled.  We decided to switch to Plavix.  OBJCTIVE -PE, EKG, labs   Wt Readings from Last 3 Encounters:  10/04/19 (!) 254 lb 3.2 oz (115.3 kg)  07/08/19 260 lb (117.9 kg)  05/13/19 260 lb (117.9 kg)  -- Intention -adjusted diet & eating.   Physical Exam: BP 120/72   Pulse 79   Ht 5\' 10"  (1.778 m)   Wt (!) 254 lb 3.2 oz (115.3 kg)   SpO2 99%   BMI 36.47 kg/m  Physical Exam Constitutional:      General: He is not in acute distress.    Appearance: Normal appearance. He is obese. He is not ill-appearing.     Comments: Well-groomed.  Healthy-appearing  Neck:     Vascular: No carotid bruit.     Comments: No JVD or HJR Cardiovascular:     Rate and Rhythm: Normal rate and regular rhythm.     Pulses: Normal pulses.     Heart sounds: Normal heart sounds. No murmur heard.  No friction rub. No gallop.   Pulmonary:     Effort: Pulmonary effort is normal. No respiratory distress.     Breath sounds: Normal breath sounds.  Chest:     Chest wall: No tenderness.  Musculoskeletal:        General: No swelling. Normal range of motion.     Cervical back: Normal range of motion and neck supple.  Neurological:     General: No focal deficit present.     Mental Status: He is alert and oriented to person, place, and time.  Psychiatric:        Mood and Affect: Mood normal.  Behavior: Behavior normal.        Thought Content: Thought content normal.        Judgment: Judgment normal.     Adult ECG Report  Recent Labs:    Lab Results    Component Value Date   CHOL 118 06/09/2019   HDL 32 (L) 06/09/2019   LDLCALC 55 06/09/2019   TRIG 183 (H) 06/09/2019   CHOLHDL 3.7 06/09/2019   Lab Results  Component Value Date   CREATININE 1.19 06/09/2019   BUN 23 06/09/2019   NA 137 06/09/2019   K 4.4 06/09/2019   CL 100 06/09/2019   CO2 21 06/09/2019   Lab Results  Component Value Date   TSH 1.410 04/21/2018    ASSESSMENT/PLAN    Problem List Items Addressed This Visit    h/o Acute ST elevation myocardial infarction (STEMI) of anterolateral wall (HCC) (Chronic)    Remarkable presentation with VF arrest requiring prolonged CPR before ROSC.  Found to have anterior MI with PCI to LAD.  PTSD on this seems to have improved.  It has been a life-changing event for him as he is working on adjusting his diet, and has avoided illicit substance use as well as tobacco. Follow-up echo showed significant improved EF of 55 to 60%. Stable on current dose of carvedilol and ARB.      Presence of drug coated stent in LAD coronary artery (Chronic)    Since he is now in the process of needing to do new paperwork for Brilinta, I see this is a good opportunity to switch him to generic clopidogrel/Plavix. With a long stent in his LAD I would like to keep him on a Thienopyridine antiplatelet agent.  Plan: DC aspirin.  At the completion of current bottle of Brilinta, discontinue Brilinta and start clopidogrel/Plavix 75 mg daily.  Okay to hold Plavix 5-7 days preop for any surgery or procedure.      Coronary artery disease involving native coronary artery without angina pectoris (Chronic)    He has a stent in the LAD with PTCA of the diagonal branch.  Also has existing 60% RI disease.  No anginal symptoms.  He has had 2 treadmill stress test now that are both negative.  Plan:  Continue current dose of carvedilol and losartan.  Continue statin.  DC aspirin,   convert from Brilinta to Plavix 75 mg to continue with maintenance dose  antiplatelet agent.  Would like to continue long-term.  Okay to hold Plavix 5-7 days preop for procedures or surgeries.      Relevant Orders   Lipid panel   Comprehensive metabolic panel   Hyperlipidemia with target low density lipoprotein (LDL) cholesterol less than 70 mg/dL (Chronic)    Most recent lipids from April showed pretty well-controlled LDL.  HDL is not very high so he needs to increase exercise.  Triglycerides are little elevated.  Talked about adjusting his diet.  He should be due for follow-up labs-FLP and CMP in November.  For now continue current dose of statin.      Relevant Orders   Lipid panel   Comprehensive metabolic panel   Essential hypertension (Chronic)    Blood pressure well controlled on current meds.      Relevant Orders   Comprehensive metabolic panel   Palpitations (Chronic)    Really not having palpitations anymore since we increase his beta-blocker.      Encounter for examination required by Department of Transportation (DOT) - Primary    Mr. Stotler  had a history of anterior MI with LAD PCI.  He had to-vessel CAD with occluded LAD and then a 60% RI lesion.  We had done surveillance stress testing to evaluate for ischemia.  He has no ischemic symptoms.  Adequate ETT performed on June 9.  Results provided.  This should be adequate for him to pass his physical.  Blood pressure is well controlled. EF greater than 45% with no further arrhythmias.      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        COVID-19 Education: The signs and symptoms of COVID-19 were discussed with the patient and how to seek care for testing (follow up with PCP or arrange E-visit).   The importance of social distancing and COVID-19 vaccination was discussed today.  I spent a total of 15 minutes with the patient. >  50% of the time was spent in direct patient consultation.  Additional time spent with chart review  / charting (studies, outside  notes, etc): 5 Total Time: 20 min   Current medicines are reviewed at length with the patient today.  (+/- concerns) none  Notice: This dictation was prepared with Dragon dictation along with smaller phrase technology. Any transcriptional errors that result from this process are unintentional and may not be corrected upon review.  Patient Instructions / Medication Changes & Studies & Tests Ordered   Patient Instructions   Medication Instructions:  MAY STOP TAKING ASPIRIN 81 MG STOP BRILINTA  START TAKING  PLAVIX ( CLOPIDOGREL ) 75 MG ONE TABLET DAILY   OK TO USE TADALAFIL ( CIALIS)    *If you need a refill on your cardiac medications before your next appointment, please call your pharmacy*   Lab Work: IN NOV 2021 - FASTING -- LIPID , CMP  If you have labs (blood work) drawn today and your tests are completely normal, you will receive your results only by: Marland Kitchen. MyChart Message (if you have MyChart) OR . A paper copy in the mail If you have any lab test that is abnormal or we need to change your treatment, we will call you to review the results.   Testing/Procedures: NOT NEEDED   Follow-Up: At [facility], you and your health needs are our priority.  As part of our continuing mission to provide you with exceptional heart care, we have created designated Provider Care Teams.  These Care Teams include your primary Cardiologist (physician) and Advanced Practice Providers (APPs -  Physician Assistants and Nurse Practitioners) who all work together to provide you with the care you need, when you need it.  We recommend signing up for the patient portal called "MyChart".  Sign up information is provided on this After Visit Summary.  MyChart is used to connect with patients for Virtual Visits (Telemedicine).  Patients are able to view lab/test results, encounter notes, upcoming appointments, etc.  Non-urgent messages can be sent to your provider as well.   To learn more about what you  can do with MyChart, go to [email or web address].    Your next appointment:   12 month(s) AUG 2022  The format for your next appointment:   In Person  Provider:   Bryan Lemmaavid Frederic Tones, MD   Other Instructions GXT: Exercised 7:03 min. 8.6 METS - Peak HR 151 bpm = 88% MPHR of 171 bpm. Normal BP & HR response to exercise. No ischemic EKG changes with no CP.  - Negative, adequate ETT. (could not walk fast enough to continue due  to Knee pain) --OK for DOT physical     Studies Ordered:   Orders Placed This Encounter  Procedures  . Lipid panel  . Comprehensive metabolic panel     Bryan Lemma, M.D., M.S. Interventional Cardiologist   Pager # [phone] Phone # [phone] [address] [place], [place] [address]   Thank you for choosing Heartcare at [facility]!!

## 2019-10-05 MED FILL — ?CHLORTHALIDONE 25 MG TABLE: 25 | 30 days supply | Qty: 30 | Fill #1

## 2019-10-05 MED FILL — ?CARVEDILOL 12.5 MG TABLET: 12.5 | 30 days supply | Qty: 75 | Fill #1

## 2019-10-05 MED FILL — LOSARTAN POTASSIUM 100 MG T: 100 | 30 days supply | Qty: 30 | Fill #1

## 2019-10-07 ENCOUNTER — Encounter: Payer: Self-pay | Admitting: Cardiology

## 2019-10-07 DIAGNOSIS — Z0289 Encounter for other administrative examinations: Secondary | ICD-10-CM | POA: Insufficient documentation

## 2019-10-07 NOTE — Assessment & Plan Note (Signed)
Really not having palpitations anymore since we increase his beta-blocker.

## 2019-10-07 NOTE — Assessment & Plan Note (Signed)
Since he is now in the process of needing to do new paperwork for Brilinta, I see this is a good opportunity to switch him to generic clopidogrel/Plavix. With a long stent in his LAD I would like to keep him on a Thienopyridine antiplatelet agent.  Plan: DC aspirin.  At the completion of current bottle of Brilinta, discontinue Brilinta and start clopidogrel/Plavix 75 mg daily.  Okay to hold Plavix 5-7 days preop for any surgery or procedure.

## 2019-10-07 NOTE — Assessment & Plan Note (Signed)
Congratulated on his efforts smoking cessation.

## 2019-10-07 NOTE — Assessment & Plan Note (Signed)
Rodney Lloyd had a history of anterior MI with LAD PCI.  He had to-vessel CAD with occluded LAD and then a 60% RI lesion.  We had done surveillance stress testing to evaluate for ischemia.  He has no ischemic symptoms.  Adequate ETT performed on June 9.  Results provided.  This should be adequate for him to pass his physical.  Blood pressure is well controlled. EF greater than 45% with no further arrhythmias.

## 2019-10-07 NOTE — Assessment & Plan Note (Signed)
He has a stent in the LAD with PTCA of the diagonal branch.  Also has existing 60% RI disease.  No anginal symptoms.  He has had 2 treadmill stress test now that are both negative.  Plan:  Continue current dose of carvedilol and losartan.  Continue statin.  DC aspirin,   convert from Brilinta to Plavix 75 mg to continue with maintenance dose antiplatelet agent.  Would like to continue long-term.  Okay to hold Plavix 5-7 days preop for procedures or surgeries.

## 2019-10-07 NOTE — Assessment & Plan Note (Signed)
Blood pressure well controlled on current meds.

## 2019-10-07 NOTE — Assessment & Plan Note (Signed)
Most recent lipids from April showed pretty well-controlled LDL.  HDL is not very high so he needs to increase exercise.  Triglycerides are little elevated.  Talked about adjusting his diet.  He should be due for follow-up labs-FLP and CMP in November.  For now continue current dose of statin.

## 2019-10-07 NOTE — Assessment & Plan Note (Signed)
Remarkable presentation with VF arrest requiring prolonged CPR before ROSC.  Found to have anterior MI with PCI to LAD.  PTSD on this seems to have improved.  It has been a life-changing event for him as he is working on adjusting his diet, and has avoided illicit substance use as well as tobacco. Follow-up echo showed significant improved EF of 55 to 60%. Stable on current dose of carvedilol and ARB.

## 2019-10-27 MED FILL — ?ROSUVASTAIN CALCM. 40MG: 40 | 30 days supply | Qty: 30 | Fill #0

## 2019-10-29 ENCOUNTER — Telehealth: Payer: Self-pay | Admitting: Cardiology

## 2019-10-29 NOTE — Telephone Encounter (Signed)
Returned call to pt and he has been made aware that [facility] should have refills of all of his cardiac medications at the pharmacy, for 90 day supply.  Pt thanked me for the call back.

## 2019-10-29 NOTE — Telephone Encounter (Signed)
New message     *STAT* If patient is at the pharmacy, call can be transferred to refill team.   1. Which medications need to be refilled? (please list name of each medication and dose if known) carvedilol, losartan, rosuvastatin, clopidogrel and chlorthalidone  2. Which pharmacy/location (including street and city if local pharmacy) is [facility] to [facility] sent to[facility]  3. Do they need a 30 day or 90 day supply? 90 day.  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

## 2019-11-01 MED FILL — ?CLOPIDOGREL 75MG TA: 75 | 30 days supply | Qty: 30 | Fill #1

## 2019-11-01 MED FILL — ?CARVEDILOL 12.5 MG TABLET: 12.5 | 30 days supply | Qty: 75 | Fill #2

## 2019-11-01 MED FILL — ?CHLORTHALIDONE 25 MG TABLE: 25 | 30 days supply | Qty: 30 | Fill #2

## 2019-11-01 MED FILL — LOSARTAN POTASSIUM 100 MG T: 100 | 30 days supply | Qty: 30 | Fill #2

## 2019-11-04 ENCOUNTER — Telehealth: Payer: Self-pay

## 2019-11-04 ENCOUNTER — Other Ambulatory Visit: Payer: Self-pay

## 2019-11-04 DIAGNOSIS — N529 Male erectile dysfunction, unspecified: Secondary | ICD-10-CM

## 2019-11-04 MED ORDER — TADALAFIL 20 MG PO TABS: ORAL_TABLET | ORAL | 3 refills | Status: DC

## 2019-11-04 MED FILL — TADALAFIL 20 MG TABS: 20 | 30 days supply | Qty: 10 | Fill #0

## 2019-11-04 NOTE — Telephone Encounter (Signed)
Tadalafil rx refill submitted.

## 2019-11-04 NOTE — Telephone Encounter (Signed)
-----   Message from Malen Gauze, MD sent at 11/03/2019  2:15 PM EDT ----- Regarding: RE: tadalafil/cardiology Refill it ----- Message ----- From: Ferdinand Lango, RN Sent: 11/02/2019   1:35 PM EDT To: Malen Gauze, MD Subject: tadalafil/cardiology                           Pt has heart history   You haven't seen him since August 2020. What would you like to do about this refill? You last urochart note stated you would wait until he saw Cardiology. Can you review their note if he can proceed with refill?

## 2019-11-25 ENCOUNTER — Ambulatory Visit (INDEPENDENT_AMBULATORY_CARE_PROVIDER_SITE_OTHER): Payer: No Typology Code available for payment source | Admitting: Urology

## 2019-11-25 ENCOUNTER — Other Ambulatory Visit: Payer: Self-pay | Admitting: Urology

## 2019-11-25 ENCOUNTER — Encounter: Payer: Self-pay | Admitting: Urology

## 2019-11-25 ENCOUNTER — Other Ambulatory Visit: Payer: Self-pay

## 2019-11-25 VITALS — BP 110/71 | HR 76 | Temp 97.6°F | Ht 70.0 in | Wt 254.2 lb

## 2019-11-25 DIAGNOSIS — N529 Male erectile dysfunction, unspecified: Secondary | ICD-10-CM

## 2019-11-25 MED ORDER — TADALAFIL 20 MG PO TABS: ORAL_TABLET | ORAL | 11 refills | Status: DC

## 2019-11-25 MED FILL — TADALAFIL 20 MG TABS: 20 | 30 days supply | Qty: 10 | Fill #0

## 2019-11-25 NOTE — Progress Notes (Signed)

## 2019-11-25 NOTE — Progress Notes (Signed)
11/25/2019 10:29 AM   Rodney Lloyd Jul 24, 1970 [id number]  Referring provider: Bing Neighbors, FNP [address] [place],  [place] [address]  Erectile dysfunction  HPI: Mr Roger is a 49yo here for followup for erectile dysfunction. He is doing well since last visit. He is using cilais 20mg  prn for ED with good results. No LUTS. Good libido. NO chest pain   PMH: Past Medical History:  Diagnosis Date   Acute ST elevation myocardial infarction (STEMI) involving left anterior descending (LAD) coronary artery (HCC) 01/04/2019   Post VF Arrest (cocaine +) --> 100% p-mLAD after D1 (pLAD 60% & Ost Diag 75%) --> DES PCI to LAD & PTCA of ~small D1.  EF back up to 55-60% by Echo on d/c - distal anterior HK.   Alcohol abuse    CAD (coronary artery disease), native coronary artery    s/p DES PCI to LAD 01/2018 (Synergy DES 3 x 28 -- 3.6) acoss D1 with PTCA of jailed  D1.    Cocaine abuse (HCC)    HLD (hyperlipidemia)    Hypertension    Tobacco abuse     Surgical History: Past Surgical History:  Procedure Laterality Date   CORONARY/GRAFT ACUTE MI REVASCULARIZATION N/A 01/03/2018   Procedure: Coronary/Graft Acute MI Revascularization- CORONARY STENT INTERVENTION;  Surgeon: 13/04/2017, MD;  Location: [facility] INVASIVE CV LAB; DES PCI pLAD: Synergy DES 3 x 28 (3.6) & PTCA of OstD1 (jailed)    RIGHT/LEFT HEART CATH AND CORONARY ANGIOGRAPHY N/A 01/03/2018   Procedure: RIGHT/LEFT HEART CATH AND CORONARY ANGIOGRAPHY;  Surgeon: 13/04/2017, MD;  Location: [facility] INVASIVE CV LAB;  Service: Cardiovascular;;  (VF -> Ant STEMI): 100% mLAD, 80% 1st Diag & 65%ost RI --> DES PCI p-mLAD & PTCA of D1.  EF~45% w/ anterior anterolateral HK.  Minimally Elevated LVEDP & PCWP & normal RHC pressures   TRANSTHORACIC ECHOCARDIOGRAM  01/04/2018   (following Ant STEMI - VF arrest):  EF 55-60%. Mild HK of apical anterior wall c/w LAD ischemia. Gr 1 DD. Normal valves & chamber sizes.       Home  Medications:  Allergies as of 11/25/2019      Reactions   Atorvastatin Other (See Comments)   myalgias      Medication List       Accurate as of November 25, 2019 10:29 AM. If you have any questions, ask your nurse or doctor.        carvedilol 12.5 MG tablet Commonly known as: COREG Take 1 tablet (12.5 mg total) by mouth every morning AND 1.5 tablets (18.75 mg total) at bedtime.   chlorthalidone 25 MG tablet Commonly known as: HYGROTON Take 1 tablet (25 mg total) by mouth daily.   clopidogrel 75 MG tablet Commonly known as: PLAVIX Take 1 tablet (75 mg total) by mouth daily.   erythromycin ophthalmic ointment Place a 1/2 inch ribbon of ointment into the lower eyelid. (left eye) for the next 1 week.   losartan 100 MG tablet Commonly known as: COZAAR Take 1 tablet (100 mg total) by mouth daily. Discontinue  Previous dose of 50 mg   nitroGLYCERIN 0.4 MG SL tablet Commonly known as: NITROSTAT Place 1 tablet (0.4 mg total) under the tongue every 5 (five) minutes as needed for chest pain.   rosuvastatin 40 MG tablet Commonly known as: CRESTOR Take 1 tablet (40 mg total) by mouth daily.   tadalafil (PAH) 20 MG tablet Commonly known as: ADCIRCA TAKE 1/2 TO 1 TABLET  BY MOUTH 1 HOUR PRIOR TO INTERCOURSE AS NEEDED. DO NOT USE NITROGLYCERIN 24HR PRIOR TO USE OR 48HR AFTER USE OF CIALIS   tadalafil 20 MG tablet Commonly known as: CIALIS Take 1 tab 1/2 to 1 hr prior to intercourse PRN. Do not use Nitroglycerin 24 hr prior to use or 48 hrs after use of cialis..       Allergies:  Allergies  Allergen Reactions   Atorvastatin Other (See Comments)    myalgias    Family History: Family History  Problem Relation Age of Onset   Hyperlipidemia Mother    Heart disease Father     Social History:  reports that he quit smoking about 15 months ago. His smoking use included cigarettes. He smoked 0.10 packs per day. He has never used smokeless tobacco. He reports previous  alcohol use. He reports previous drug use.  ROS: All other review of systems were reviewed and are negative except what is noted above in HPI  Physical Exam: BP 110/71    Pulse 76    Temp 97.6 F (36.4 C)    Ht 5\' 10"  (1.778 m)    Wt 254 lb 3.2 oz (115.3 kg)    BMI 36.47 kg/m   Constitutional:  Alert and oriented, No acute distress. HEENT: [place] AT, moist mucus membranes.  Trachea midline, no masses. Cardiovascular: No clubbing, cyanosis, or edema. Respiratory: Normal respiratory effort, no increased work of breathing. GI: Abdomen is soft, nontender, nondistended, no abdominal masses GU: No CVA tenderness.  Lymph: No cervical or inguinal lymphadenopathy. Skin: No rashes, bruises or suspicious lesions. Neurologic: Grossly intact, no focal deficits, moving all 4 extremities. Psychiatric: Normal mood and affect.  Laboratory Data: Lab Results  Component Value Date   WBC 8.6 06/09/2019   HGB 11.6 (L) 06/09/2019   HCT 34.1 (L) 06/09/2019   MCV 84 06/09/2019   PLT 202 06/09/2019    Lab Results  Component Value Date   CREATININE 1.19 06/09/2019    No results found for: PSA  No results found for: TESTOSTERONE  Lab Results  Component Value Date   HGBA1C 5.7 (H) 01/06/2018    Urinalysis    Component Value Date/Time   COLORURINE YELLOW 01/03/2018 1624   APPEARANCEUR CLEAR 01/03/2018 1624   LABSPEC 1.045 (H) 01/03/2018 1624   PHURINE 5.0 01/03/2018 1624   GLUCOSEU NEGATIVE 01/03/2018 1624   HGBUR LARGE (A) 01/03/2018 1624   BILIRUBINUR NEGATIVE 01/03/2018 1624   KETONESUR NEGATIVE 01/03/2018 1624   PROTEINUR 30 (A) 01/03/2018 1624   NITRITE NEGATIVE 01/03/2018 1624   LEUKOCYTESUR NEGATIVE 01/03/2018 1624    Lab Results  Component Value Date   BACTERIA FEW (A) 01/03/2018    Pertinent Imaging:  Results for orders placed during the hospital encounter of 01/03/18  DG Abd 1 View  Narrative CLINICAL DATA:  NG tube placement.  EXAM: ABDOMEN - 1 VIEW  COMPARISON:   01/03/2018  FINDINGS: The NG tube tip is in the antral region of the stomach. External pacer paddles are noted. The lung bases are grossly clear. No obvious free air is identified  IMPRESSION: The NG tube tip is in the antral region of the stomach.   Electronically Signed By: 13/04/2017 M.D. On: 01/07/2018 08:45  No results found for this or any previous visit.  No results found for this or any previous visit.  No results found for this or any previous visit.  No results found for this or any previous visit.  No results found  for this or any previous visit.  No results found for this or any previous visit.  No results found for this or any previous visit.   Assessment & Plan:    1. Erectile dysfunction, unspecified erectile dysfunction type -refill cialis 20mg  prn - Urinalysis, Routine w reflex microscopic   No follow-ups on file.  , MD  [facility] [place]

## 2019-11-25 NOTE — Patient Instructions (Signed)
Erectile Dysfunction Erectile dysfunction (ED) is the inability to get or keep an erection in order to have sexual intercourse. Erectile dysfunction may include:  Inability to get an erection.  Lack of enough hardness of the erection to allow penetration.  Loss of the erection before sex is finished. What are the causes? This condition may be caused by:  Certain medicines, such as: ? Pain relievers. ? Antihistamines. ? Antidepressants. ? Blood pressure medicines. ? Water pills (diuretics). ? Ulcer medicines. ? Muscle relaxants. ? Drugs.  Excessive drinking.  Psychological causes, such as: ? Anxiety. ? Depression. ? Sadness. ? Exhaustion. ? Performance fear. ? Stress.  Physical causes, such as: ? Artery problems. This may include diabetes, smoking, liver disease, or atherosclerosis. ? High blood pressure. ? Hormonal problems, such as low testosterone. ? Obesity. ? Nerve problems. This may include back or pelvic injuries, diabetes mellitus, multiple sclerosis, or Parkinson disease. What are the signs or symptoms? Symptoms of this condition include:  Inability to get an erection.  Lack of enough hardness of the erection to allow penetration.  Loss of the erection before sex is finished.  Normal erections at some times, but with frequent unsatisfactory episodes.  Low sexual satisfaction in either partner due to erection problems.  A curved penis occurring with erection. The curve may cause pain or the penis may be too curved to allow for intercourse.  Never having nighttime erections. How is this diagnosed? This condition is often diagnosed by:  Performing a physical exam to find other diseases or specific problems with the penis.  Asking you detailed questions about the problem.  Performing blood tests to check for diabetes mellitus or to measure hormone levels.  Performing other tests to check for underlying health conditions.  Performing an ultrasound  exam to check for scarring.  Performing a test to check blood flow to the penis.  Doing a sleep study at home to measure nighttime erections. How is this treated? This condition may be treated by:  Medicine taken by mouth to help you achieve an erection (oral medicine).  Hormone replacement therapy to replace low testosterone levels.  Medicine that is injected into the penis. Your health care provider may instruct you how to give yourself these injections at home.  Vacuum pump. This is a pump with a ring on it. The pump and ring are placed on the penis and used to create pressure that helps the penis become erect.  Penile implant surgery. In this procedure, you may receive: ? An inflatable implant. This consists of cylinders, a pump, and a reservoir. The cylinders can be inflated with a fluid that helps to create an erection, and they can be deflated after intercourse. ? A semi-rigid implant. This consists of two silicone rubber rods. The rods provide some rigidity. They are also flexible, so the penis can both curve downward in its normal position and become straight for sexual intercourse.  Blood vessel surgery, to improve blood flow to the penis. During this procedure, a blood vessel from a different part of the body is placed into the penis to allow blood to flow around (bypass) damaged or blocked blood vessels.  Lifestyle changes, such as exercising more, losing weight, and quitting smoking. Follow these instructions at home: Medicines   Take over-the-counter and prescription medicines only as told by your health care provider. Do not increase the dosage without first discussing it with your health care provider.  If you are using self-injections, perform injections as directed by your   health care provider. Make sure to avoid any veins that are on the surface of the penis. After giving an injection, apply pressure to the injection site for 5 minutes. General  instructions  Exercise regularly, as directed by your health care provider. Work with your health care provider to lose weight, if needed.  Do not use any products that contain nicotine or tobacco, such as cigarettes and e-cigarettes. If you need help quitting, ask your health care provider.  Before using a vacuum pump, read the instructions that come with the pump and discuss any questions with your health care provider.  Keep all follow-up visits as told by your health care provider. This is important. Contact a health care provider if:  You feel nauseous.  You vomit. Get help right away if:  You are taking oral or injectable medicines and you have an erection that lasts longer than 4 hours. If your health care provider is unavailable, go to the nearest emergency room for evaluation. An erection that lasts much longer than 4 hours can result in permanent damage to your penis.  You have severe pain in your groin or abdomen.  You develop redness or severe swelling of your penis.  You have redness spreading up into your groin or lower abdomen.  You are unable to urinate.  You experience chest pain or a rapid heart beat (palpitations) after taking oral medicines. Summary  Erectile dysfunction (ED) is the inability to get or keep an erection during sexual intercourse. This problem can usually be treated successfully.  This condition is diagnosed based on a physical exam, your symptoms, and tests to determine the cause. Treatment varies depending on the cause, and may include medicines, hormone therapy, surgery, or vacuum pump.  You may need follow-up visits to make sure that you are using your medicines or devices correctly.  Get help right away if you are taking or injecting medicines and you have an erection that lasts longer than 4 hours. This information is not intended to replace advice given to you by your health care provider. Make sure you discuss any questions you have with  your health care provider. Document Revised: 01/31/2017 Document Reviewed: 03/06/2016 Elsevier Patient Education  2020 Elsevier Inc.  

## 2019-11-29 MED FILL — ?CLOPIDOGREL 75MG TA: 75 | 30 days supply | Qty: 30 | Fill #2

## 2019-11-29 MED FILL — LOSARTAN POTASSIUM 100 MG T: 100 | 30 days supply | Qty: 30 | Fill #3

## 2019-11-29 MED FILL — ?CARVEDILOL 12.5 MG TABLET: 12.5 | 30 days supply | Qty: 75 | Fill #3

## 2019-11-29 MED FILL — ?ROSUVASTAIN CALCM. 40MG: 40 | 30 days supply | Qty: 30 | Fill #1

## 2019-11-29 MED FILL — ?CHLORTHALIDONE 25 MG TABLE: 25 | 30 days supply | Qty: 30 | Fill #3

## 2019-12-08 MED FILL — ?ROSUVASTAIN CALCM. 40MG: 40 | 30 days supply | Qty: 30 | Fill #1

## 2019-12-08 MED FILL — ?CHLORTHALIDONE 25 MG TABLE: 25 | 30 days supply | Qty: 30 | Fill #3

## 2019-12-08 MED FILL — ?CLOPIDOGREL 75MG TA: 75 | 30 days supply | Qty: 30 | Fill #2

## 2019-12-08 MED FILL — CARVEDILOL 12.5 MG TABLET: 12.5 | 30 days supply | Qty: 75 | Fill #3

## 2019-12-08 MED FILL — LOSARTAN POTASSIUM 100 MG T: 100 | 30 days supply | Qty: 30 | Fill #3

## 2019-12-23 MED FILL — TADALAFIL 20 MG TABS: 20 | 30 days supply | Qty: 10 | Fill #1

## 2019-12-31 ENCOUNTER — Other Ambulatory Visit: Payer: Self-pay | Admitting: Urology

## 2019-12-31 ENCOUNTER — Other Ambulatory Visit: Payer: Self-pay

## 2019-12-31 DIAGNOSIS — N529 Male erectile dysfunction, unspecified: Secondary | ICD-10-CM

## 2019-12-31 MED ORDER — AMBULATORY NON FORMULARY MEDICATION: 0.2000 mL | 5 refills | Status: DC | PRN

## 2020-01-13 ENCOUNTER — Ambulatory Visit: Payer: No Typology Code available for payment source | Admitting: Urology

## 2020-01-13 ENCOUNTER — Other Ambulatory Visit: Payer: Self-pay

## 2020-01-13 ENCOUNTER — Ambulatory Visit (INDEPENDENT_AMBULATORY_CARE_PROVIDER_SITE_OTHER): Payer: No Typology Code available for payment source | Admitting: Urology

## 2020-01-13 ENCOUNTER — Encounter: Payer: Self-pay | Admitting: Urology

## 2020-01-13 VITALS — BP 114/67 | HR 76 | Temp 98.7°F | Ht 70.0 in | Wt 254.2 lb

## 2020-01-13 DIAGNOSIS — N529 Male erectile dysfunction, unspecified: Secondary | ICD-10-CM

## 2020-01-13 MED FILL — ?CLOPIDOGREL 75MG TA: 75 | 30 days supply | Qty: 30 | Fill #3

## 2020-01-13 MED FILL — CARVEDILOL 12.5 MG TABLET: 12.5 | 30 days supply | Qty: 75 | Fill #4

## 2020-01-13 MED FILL — ?CHLORTHALIDONE 25 MG TABLE: 25 | 30 days supply | Qty: 30 | Fill #4

## 2020-01-13 MED FILL — ?ROSUVASTATIN CALCIUM 40M T: 40 | 30 days supply | Qty: 30 | Fill #2

## 2020-01-13 MED FILL — LOSARTAN POTASSIUM 100 MG T: 100 | 30 days supply | Qty: 30 | Fill #4

## 2020-01-13 NOTE — Progress Notes (Signed)
01/13/2020 10:49 AM   Raymon Mutton [birth year] [id number]  Referring provider: Bing Neighbors, FNP [address] [place],  [place] [address]  Erectile dysfunction  HPI: Mr Crompton is a 49yo here for followup for erectile dysfunction. Cialis failed to give a firm erection. He is here for trimix teaching   PMH: Past Medical History:  Diagnosis Date   Acute ST elevation myocardial infarction (STEMI) involving left anterior descending (LAD) coronary artery (HCC) 01/04/2019   Post VF Arrest (cocaine +) --> 100% p-mLAD after D1 (pLAD 60% & Ost Diag 75%) --> DES PCI to LAD & PTCA of ~small D1.  EF back up to 55-60% by Echo on d/c - distal anterior HK.   Alcohol abuse    CAD (coronary artery disease), native coronary artery    s/p DES PCI to LAD 01/2018 (Synergy DES 3 x 28 -- 3.6) acoss D1 with PTCA of jailed  D1.    Cocaine abuse (HCC)    HLD (hyperlipidemia)    Hypertension    Tobacco abuse     Surgical History: Past Surgical History:  Procedure Laterality Date   CORONARY/GRAFT ACUTE MI REVASCULARIZATION N/A 01/03/2018   Procedure: Coronary/Graft Acute MI Revascularization- CORONARY STENT INTERVENTION;  Surgeon: Marykay Lex, MD;  Location: [facility] INVASIVE CV LAB; DES PCI pLAD: Synergy DES 3 x 28 (3.6) & PTCA of OstD1 (jailed)    RIGHT/LEFT HEART CATH AND CORONARY ANGIOGRAPHY N/A 01/03/2018   Procedure: RIGHT/LEFT HEART CATH AND CORONARY ANGIOGRAPHY;  Surgeon: Marykay Lex, MD;  Location: [facility] INVASIVE CV LAB;  Service: Cardiovascular;;  (VF -> Ant STEMI): 100% mLAD, 80% 1st Diag & 65%ost RI --> DES PCI p-mLAD & PTCA of D1.  EF~45% w/ anterior anterolateral HK.  Minimally Elevated LVEDP & PCWP & normal RHC pressures   TRANSTHORACIC ECHOCARDIOGRAM  01/04/2018   (following Ant STEMI - VF arrest):  EF 55-60%. Mild HK of apical anterior wall c/w LAD ischemia. Gr 1 DD. Normal valves & chamber sizes.       Home Medications:  Allergies as of 01/13/2020      Reactions    Atorvastatin Other (See Comments)   myalgias      Medication List       Accurate as of January 13, 2020 10:49 AM. If you have any questions, ask your nurse or doctor.        AMBULATORY NON FORMULARY MEDICATION 0.2 mLs by Intracavernosal route as needed. Medication Name: Trimix  PGE Pap 30mg  Phent 1mg    carvedilol 12.5 MG tablet Commonly known as: COREG Take 1 tablet (12.5 mg total) by mouth every morning AND 1.5 tablets (18.75 mg total) at bedtime.   chlorthalidone 25 MG tablet Commonly known as: HYGROTON Take 1 tablet (25 mg total) by mouth daily.   clopidogrel 75 MG tablet Commonly known as: PLAVIX Take 1 tablet (75 mg total) by mouth daily.   erythromycin ophthalmic ointment Place a 1/2 inch ribbon of ointment into the lower eyelid. (left eye) for the next 1 week.   losartan 100 MG tablet Commonly known as: COZAAR Take 1 tablet (100 mg total) by mouth daily. Discontinue  Previous dose of 50 mg   nitroGLYCERIN 0.4 MG SL tablet Commonly known as: NITROSTAT Place 1 tablet (0.4 mg total) under the tongue every 5 (five) minutes as needed for chest pain.   rosuvastatin 40 MG tablet Commonly known as: CRESTOR Take 1 tablet (40 mg total) by mouth daily.   tadalafil (PAH) 20  MG tablet Commonly known as: ADCIRCA TAKE 1/2 TO 1 TABLET BY MOUTH 1 HOUR PRIOR TO INTERCOURSE AS NEEDED. DO NOT USE NITROGLYCERIN 24HR PRIOR TO USE OR 48HR AFTER USE OF CIALIS   tadalafil 20 MG tablet Commonly known as: CIALIS Take 1 tab 1/2 to 1 hr prior to intercourse PRN. Do not use Nitroglycerin 24 hr prior to use or 48 hrs after use of cialis..       Allergies:  Allergies  Allergen Reactions   Atorvastatin Other (See Comments)    myalgias    Family History: Family History  Problem Relation Age of Onset   Hyperlipidemia Mother    Heart disease Father     Social History:  reports that he quit smoking about 16 months ago. His smoking use included cigarettes. He smoked  0.10 packs per day. He has never used smokeless tobacco. He reports previous alcohol use. He reports previous drug use.  ROS: All other review of systems were reviewed and are negative except what is noted above in HPI  Physical Exam: BP 114/67    Pulse 76    Temp 98.7 F (37.1 C)    Ht 5\' 10"  (1.778 m)    Wt 254 lb 3.2 oz (115.3 kg)    BMI 36.47 kg/m   Constitutional:  Alert and oriented, No acute distress. HEENT:  AT, moist mucus membranes.  Trachea midline, no masses. Cardiovascular: No clubbing, cyanosis, or edema. Respiratory: Normal respiratory effort, no increased work of breathing. GI: Abdomen is soft, nontender, nondistended, no abdominal masses GU: No CVA tenderness.  Lymph: No cervical or inguinal lymphadenopathy. Skin: No rashes, bruises or suspicious lesions. Neurologic: Grossly intact, no focal deficits, moving all 4 extremities. Psychiatric: Normal mood and affect.  Laboratory Data: Lab Results  Component Value Date   WBC 8.6 06/09/2019   HGB 11.6 (L) 06/09/2019   HCT 34.1 (L) 06/09/2019   MCV 84 06/09/2019   PLT 202 06/09/2019    Lab Results  Component Value Date   CREATININE 1.19 06/09/2019    No results found for: PSA  No results found for: TESTOSTERONE  Lab Results  Component Value Date   HGBA1C 5.7 (H) 01/06/2018    Urinalysis    Component Value Date/Time   COLORURINE YELLOW 01/03/2018 1624   APPEARANCEUR CLEAR 01/03/2018 1624   LABSPEC 1.045 (H) 01/03/2018 1624   PHURINE 5.0 01/03/2018 1624   GLUCOSEU NEGATIVE 01/03/2018 1624   HGBUR LARGE (A) 01/03/2018 1624   BILIRUBINUR NEGATIVE 01/03/2018 1624   KETONESUR NEGATIVE 01/03/2018 1624   PROTEINUR 30 (A) 01/03/2018 1624   NITRITE NEGATIVE 01/03/2018 1624   LEUKOCYTESUR NEGATIVE 01/03/2018 1624    Lab Results  Component Value Date   BACTERIA FEW (A) 01/03/2018    Pertinent Imaging:  Results for orders placed during the hospital encounter of 01/03/18  DG Abd 1  View  Narrative CLINICAL DATA:  NG tube placement.  EXAM: ABDOMEN - 1 VIEW  COMPARISON:  01/03/2018  FINDINGS: The NG tube tip is in the antral region of the stomach. External pacer paddles are noted. The lung bases are grossly clear. No obvious free air is identified  IMPRESSION: The NG tube tip is in the antral region of the stomach.   Electronically Signed By: 13/04/2017 M.D. On: 01/07/2018 08:45  No results found for this or any previous visit.  No results found for this or any previous visit.  No results found for this or any previous visit.  No results  found for this or any previous visit.  No results found for this or any previous visit.  No results found for this or any previous visit.  No results found for this or any previous visit.   Trimix injection instruction:  Patient instructed to draw 0.6ml of trimix into the insulin syringe. I them instructed him to inject at the mid penile shaft at either the 3 or 9 o'clock position. I then injected the patient and he achieved a good erection in 20 minutes. Penile injection completed.   Assessment & Plan:    1. Erectile dysfunction, unspecified erectile dysfunction type -Patient got good erection with 0.58ml trimix. He is injected to alternative sides with each injection. He is instructed to call with an erection that lasts 4 hours   Return in about 6 months (around 07/12/2020).  Wilkie Aye, MD  [facility] [place]

## 2020-01-13 NOTE — Progress Notes (Signed)
Urological Symptom Review  Patient is experiencing the following symptoms: Erection problems   Review of Systems  Gastrointestinal (upper)  : Negative for upper GI symptoms  Gastrointestinal (lower) : Negative for lower GI symptoms  Constitutional : Negative for symptoms  Skin: Negative for skin symptoms  Eyes: Negative for eye symptoms  Ear/Nose/Throat : Negative for Ear/Nose/Throat symptoms  Hematologic/Lymphatic: Negative for Hematologic/Lymphatic symptoms  Cardiovascular : Negative for cardiovascular symptoms  Respiratory : Negative for respiratory symptoms  Endocrine: Negative for endocrine symptoms  Musculoskeletal: Negative for musculoskeletal symptoms  Neurological: Negative for neurological symptoms  Psychologic: Negative for psychiatric symptoms  

## 2020-01-14 ENCOUNTER — Ambulatory Visit: Payer: No Typology Code available for payment source | Admitting: Urology

## 2020-02-11 ENCOUNTER — Other Ambulatory Visit: Payer: Self-pay

## 2020-02-11 DIAGNOSIS — N529 Male erectile dysfunction, unspecified: Secondary | ICD-10-CM

## 2020-02-11 MED ORDER — AMBULATORY NON FORMULARY MEDICATION: 0.2000 mL | 5 refills | Status: DC | PRN

## 2020-02-11 NOTE — Progress Notes (Signed)
rx refill

## 2020-02-14 MED FILL — TADALAFIL (PAH) 20 MG TABS: 20 | 30 days supply | Qty: 10 | Fill #0

## 2020-02-14 MED FILL — CARVEDILOL 12.5 MG TABLET: 12.5 | 30 days supply | Qty: 75 | Fill #5

## 2020-02-14 MED FILL — CLOPIDOGREL 75 MG TABLET: 75 | 30 days supply | Qty: 30 | Fill #4

## 2020-02-14 MED FILL — ROSUVASTATIN CALCIUM 40 MG: 40 | 30 days supply | Qty: 30 | Fill #0

## 2020-02-14 MED FILL — LOSARTAN POTASSIUM 100 MG T: 100 | 30 days supply | Qty: 30 | Fill #5

## 2020-02-14 MED FILL — CHLORTHALIDONE 25 MG TAB: 25 | 30 days supply | Qty: 30 | Fill #5

## 2020-03-22 MED FILL — TADALAFIL (PAH) 20 MG TABS: 20 | 30 days supply | Qty: 10 | Fill #1

## 2020-03-22 MED FILL — CHLORTHALIDONE 25 MG TAB: 25 | 90 days supply | Qty: 90 | Fill #3

## 2020-03-22 MED FILL — LOSARTAN POTASSIUM 100 MG T: 100 | 30 days supply | Qty: 30 | Fill #0

## 2020-03-22 MED FILL — ROSUVASTATIN CALCIUM 40 MG: 40 | 30 days supply | Qty: 30 | Fill #1

## 2020-03-22 MED FILL — CARVEDILOL 12.5 MG TABLET: 12.5 | 30 days supply | Qty: 75 | Fill #6

## 2020-03-22 MED FILL — CLOPIDOGREL 75 MG TABLET: 75 | 30 days supply | Qty: 30 | Fill #5

## 2020-04-20 MED FILL — TADALAFIL (PAH) 20 MG TABS: 20 | 30 days supply | Qty: 10 | Fill #2

## 2020-04-20 MED FILL — LOSARTAN POTASSIUM 100 MG T: 100 | 30 days supply | Qty: 30 | Fill #1

## 2020-04-20 MED FILL — CLOPIDOGREL 75 MG TABLET: 75 | 30 days supply | Qty: 30 | Fill #6

## 2020-04-20 MED FILL — ROSUVASTATIN CALCIUM 40 MG: 40 | 30 days supply | Qty: 30 | Fill #2

## 2020-04-20 MED FILL — CARVEDILOL 12.5 MG TABLET: 12.5 | 30 days supply | Qty: 75 | Fill #7

## 2020-05-03 MED FILL — TADALAFIL (PAH) 20 MG TABS: 20 | 30 days supply | Qty: 10 | Fill #2

## 2020-05-03 MED FILL — ROSUVASTATIN CALCIUM 40 MG: 40 | 30 days supply | Qty: 30 | Fill #2

## 2020-05-24 ENCOUNTER — Other Ambulatory Visit: Payer: Self-pay | Admitting: Cardiology

## 2020-05-24 ENCOUNTER — Other Ambulatory Visit: Payer: Self-pay | Admitting: Adult Health

## 2020-05-24 MED FILL — LOSARTAN POTASSIUM 100 MG T: 100 | 30 days supply | Qty: 30 | Fill #0

## 2020-05-24 MED FILL — CARVEDILOL 12.5 MG TABLET: 12.5 | 30 days supply | Qty: 75 | Fill #8

## 2020-05-24 MED FILL — CLOPIDOGREL 75 MG TABLET: 75 | 30 days supply | Qty: 30 | Fill #7

## 2020-05-29 MED FILL — TADALAFIL (PAH) 20 MG TABS: 20 | 30 days supply | Qty: 10 | Fill #3

## 2020-05-29 MED FILL — ROSUVASTATIN CALCIUM 40 MG: 40 | 30 days supply | Qty: 30 | Fill #3

## 2020-06-03 ENCOUNTER — Other Ambulatory Visit: Payer: Self-pay

## 2020-06-23 ENCOUNTER — Other Ambulatory Visit: Payer: Self-pay

## 2020-06-23 MED FILL — Tadalafil Tab 20 MG (PAH): ORAL | 30 days supply | Qty: 10 | Fill #0 | Status: AC

## 2020-06-23 MED FILL — Losartan Potassium Tab 100 MG: ORAL | 30 days supply | Qty: 30 | Fill #0 | Status: AC

## 2020-06-23 MED FILL — Carvedilol Tab 12.5 MG: ORAL | 30 days supply | Qty: 75 | Fill #0 | Status: AC

## 2020-06-23 MED FILL — Clopidogrel Bisulfate Tab 75 MG (Base Equiv): ORAL | 30 days supply | Qty: 30 | Fill #0 | Status: AC

## 2020-06-23 MED FILL — Rosuvastatin Calcium Tab 40 MG: ORAL | 30 days supply | Qty: 30 | Fill #0 | Status: AC

## 2020-07-12 ENCOUNTER — Ambulatory Visit: Payer: No Typology Code available for payment source | Admitting: Urology

## 2020-08-03 ENCOUNTER — Other Ambulatory Visit: Payer: Self-pay

## 2020-08-03 MED FILL — Losartan Potassium Tab 100 MG: ORAL | 30 days supply | Qty: 30 | Fill #1 | Status: AC

## 2020-08-03 MED FILL — Clopidogrel Bisulfate Tab 75 MG (Base Equiv): ORAL | 30 days supply | Qty: 30 | Fill #1 | Status: AC

## 2020-08-03 MED FILL — Tadalafil Tab 20 MG (PAH): ORAL | 30 days supply | Qty: 10 | Fill #1 | Status: AC

## 2020-08-03 MED FILL — Rosuvastatin Calcium Tab 40 MG: ORAL | 30 days supply | Qty: 30 | Fill #1 | Status: AC

## 2020-08-03 MED FILL — Carvedilol Tab 12.5 MG: ORAL | 30 days supply | Qty: 75 | Fill #1 | Status: AC

## 2020-08-04 ENCOUNTER — Other Ambulatory Visit: Payer: Self-pay

## 2020-08-05 ENCOUNTER — Emergency Department (HOSPITAL_COMMUNITY): Payer: No Typology Code available for payment source

## 2020-08-05 ENCOUNTER — Emergency Department (HOSPITAL_COMMUNITY)
Admission: EM | Admit: 2020-08-05 | Discharge: 2020-08-06 | Disposition: A | Discharge status: Home / Self Care | Payer: No Typology Code available for payment source | Attending: Emergency Medicine | Admitting: Emergency Medicine

## 2020-08-05 ENCOUNTER — Encounter (HOSPITAL_COMMUNITY): Payer: Self-pay | Admitting: Emergency Medicine

## 2020-08-05 ENCOUNTER — Other Ambulatory Visit: Payer: Self-pay

## 2020-08-05 DIAGNOSIS — Z79899 Other long term (current) drug therapy: Secondary | ICD-10-CM | POA: Insufficient documentation

## 2020-08-05 DIAGNOSIS — I2510 Atherosclerotic heart disease of native coronary artery without angina pectoris: Secondary | ICD-10-CM | POA: Insufficient documentation

## 2020-08-05 DIAGNOSIS — Z87891 Personal history of nicotine dependence: Secondary | ICD-10-CM | POA: Insufficient documentation

## 2020-08-05 DIAGNOSIS — Z7902 Long term (current) use of antithrombotics/antiplatelets: Secondary | ICD-10-CM | POA: Insufficient documentation

## 2020-08-05 DIAGNOSIS — I10 Essential (primary) hypertension: Secondary | ICD-10-CM | POA: Insufficient documentation

## 2020-08-05 DIAGNOSIS — R072 Precordial pain: Secondary | ICD-10-CM

## 2020-08-05 LAB — CBC
HCT: 38.1 % — ABNORMAL LOW (ref 39.0–52.0)
Hemoglobin: 11.9 g/dL — ABNORMAL LOW (ref 13.0–17.0)
MCH: 28.5 pg (ref 26.0–34.0)
MCHC: 31.2 g/dL (ref 30.0–36.0)
MCV: 91.4 fL (ref 80.0–100.0)
Platelets: 212 10*3/uL (ref 150–400)
RBC: 4.17 MIL/uL — ABNORMAL LOW (ref 4.22–5.81)
RDW: 13.6 % (ref 11.5–15.5)
WBC: 6.3 10*3/uL (ref 4.0–10.5)
nRBC: 0 % (ref 0.0–0.2)

## 2020-08-05 LAB — BASIC METABOLIC PANEL
Anion gap: 7 (ref 5–15)
BUN: 10 mg/dL (ref 6–20)
CO2: 25 mmol/L (ref 22–32)
Calcium: 8.9 mg/dL (ref 8.9–10.3)
Chloride: 108 mmol/L (ref 98–111)
Creatinine, Ser: 1.12 mg/dL (ref 0.61–1.24)
GFR, Estimated: >60 mL/min (ref >60)
Glucose, Bld: 95 mg/dL (ref 70–99)
Potassium: 4.5 mmol/L (ref 3.5–5.1)
Sodium: 140 mmol/L (ref 135–145)

## 2020-08-05 LAB — TROPONIN I (HIGH SENSITIVITY)
Troponin I (High Sensitivity): 5 ng/L (ref <18)
Troponin I (High Sensitivity): 5 ng/L (ref <18)

## 2020-08-05 NOTE — ED Triage Notes (Signed)
Pt reports chest pain x 4 days and out [place] meds while [place].  Denies chest pain now.  States he got back home and took meds around 6am.  Denies SOB, nausea, and vomiting.

## 2020-08-06 NOTE — ED Notes (Signed)
Patient verbalizes understanding of discharge instructions. Opportunity for questioning and answers were provided. Armband removed by staff, pt discharged from ED ambulatory.   

## 2020-08-06 NOTE — ED Provider Notes (Signed)
[facility] EMERGENCY DEPARTMENT Provider Note   CSN: [id number] Arrival date & time: 08/05/20  1500     History Chief Complaint  Patient presents with  . Chest Pain    Rodney Lloyd is a 50 y.o. male.  The history is provided by the patient.  Chest Pain Pain location:  L chest Pain quality: sharp   Pain radiates to:  Does not radiate Pain severity:  Mild Onset quality:  Gradual Timing:  Intermittent Progression:  Resolved Chronicity:  New Relieved by:  None tried Worsened by:  Nothing Associated symptoms: no diaphoresis, no dizziness, no shortness of breath and no vomiting   Patient with history of CAD presents with chest pain.  Patient reports he is a [employer] and has been out of town for several days.  Approximately 4 days ago he ran out of all of his cardiac medications.  Soon after he began having brief episodes of left-sided sharp chest pain.  The episodes would last approximately 2 minutes.  He denies any other associated symptoms.  After arriving home he was able to restart his medications and has not had any pain for the past 24 hours. No pleuritic pain is reported. He now feels at baseline.  The chest pain did occur at rest and did not appear exertional     Past Medical History:  Diagnosis Date  . Acute ST elevation myocardial infarction (STEMI) involving left anterior descending (LAD) coronary artery (HCC) 01/04/2019   Post VF Arrest (cocaine +) --> 100% p-mLAD after D1 (pLAD 60% & Ost Diag 75%) --> DES PCI to LAD & PTCA of ~small D1.  EF back up to 55-60% by Echo on d/c - distal anterior HK.  Marland Kitchen Alcohol abuse   . CAD (coronary artery disease), native coronary artery    s/p DES PCI to LAD 01/2018 (Synergy DES 3 x 28 -- 3.6) acoss D1 with PTCA of jailed  D1.   . Cocaine abuse (HCC)   . HLD (hyperlipidemia)   . Hypertension   . Tobacco abuse     Patient Active Problem List   Diagnosis Date Noted  . Encounter for examination required by  Department of Transportation (DOT) 10/07/2019  . Former smoker 01/19/2019  . Erectile dysfunction 11/11/2018  . Palpitations 06/08/2018  . Hyperlipidemia with target low density lipoprotein (LDL) cholesterol less than 70 mg/dL 78/93/8101  . Cardiac arrest due to underlying cardiac condition (HCC) 01/03/2018  . h/o Acute ST elevation myocardial infarction (STEMI) of anterolateral wall (HCC) 01/03/2018  . Cocaine abuse (HCC) 01/03/2018  . Presence of drug coated stent in LAD coronary artery 01/03/2018  . Coronary artery disease involving native coronary artery without angina pectoris 01/03/2018  . Essential hypertension 02/15/2015  . Tobacco abuse 02/15/2015    Past Surgical History:  Procedure Laterality Date  . CORONARY/GRAFT ACUTE MI REVASCULARIZATION N/A 01/03/2018   Procedure: Coronary/Graft Acute MI Revascularization- CORONARY STENT INTERVENTION;  Surgeon: Marykay Lex, MD;  [facility]: [facility] INVAS[facility] CV LAB; DES PCI pLAD: Synergy DES 3 x 28 (3.6) & PTCA of OstD1 (jailed)   . RIGHT/LEFT HEART CATH AND CORONARY ANGIOGRAPHY N/A 01/03/2018   Procedure: RIGHT/LEFT HEART CATH AND CORONARY ANGIOGRAPHY;  Surgeon: Marykay Lex, MD;  Location: [facility] INVASIVE CV LAB;  Service: Cardiovascular;;  (VF -> Ant STEMI): 100% mLAD, 80% 1st Diag & 65%ost RI --> DES PCI p-mLAD & PTCA of D1.  EF~45% w/ anterior anterolateral HK.  Minimally Elevated LVEDP & PCWP & normal RHC  pressures  . TRANSTHORACIC ECHOCARDIOGRAM  01/04/2018   (following Ant STEMI - VF arrest):  EF 55-60%. Mild HK of apical anterior wall c/w LAD ischemia. Gr 1 DD. Normal valves & chamber sizes.          Family History  Problem Relation Age of Onset  . Hyperlipidemia Mother   . Heart disease Father     Social History   Tobacco Use  . Smoking status: Former Smoker    Packs/day: 0.10    Types: Cigarettes    Quit date: 08/25/2018    Years since quitting: 1.9  . Smokeless tobacco: Never Used  Vaping Use  . Vaping Use: Never used   Substance Use Topics  . Alcohol use: Not Currently  . Drug use: Not Currently    Home Medications Prior to Admission medications   Medication Sig Start Date End Date Taking? Authorizing Provider  AMBULATORY NON FORMULARY MEDICATION 0.2 mLs by Intracavernosal route as needed. Medication Name: Trimix  PGE Pap 30mg  Phent 1mg  02/11/20   McKenzie, , MD  carvedilol (COREG) 12.5 MG tablet TAKE 1 TABLET (12.5 MG TOTAL) BY MOUTH EVERY MORNING AND 1.5 TABLETS (18.75 MG TOTAL) AT BEDTIME. 08/31/19 09/03/20  09/02/19, MD  chlorthalidone (HYGROTON) 25 MG tablet TAKE 1 TABLET (25 MG TOTAL) BY MOUTH DAILY. 05/24/20 05/24/21  05/26/20, MD  clopidogrel (PLAVIX) 75 MG tablet TAKE 1 TABLET (75 MG TOTAL) BY MOUTH DAILY. 10/04/19 10/03/20  12/04/19, MD  erythromycin ophthalmic ointment Place a 1/2 inch ribbon of ointment into the lower eyelid. (left eye) for the next 1 week. 07/08/19   Marykay Lex, PA  losartan (COZAAR) 100 MG tablet TAKE 1 TABLET (100 MG TOTAL) BY MOUTH DAILY. 05/24/20 05/24/21  05/26/20, MD  nitroGLYCERIN (NITROSTAT) 0.4 MG SL tablet Place 1 tablet (0.4 mg total) under the tongue every 5 (five) minutes as needed for chest pain. Patient not taking: Reported on 11/25/2019 03/03/18   11/27/2019, FNP  rosuvastatin (CRESTOR) 40 MG tablet TAKE 1 TABLET (40 MG TOTAL) BY MOUTH DAILY. Patient not taking: Reported on 11/25/2019 08/31/19 09/03/20  09/02/19, MD  tadalafil (CIALIS) 20 MG tablet TAKE 1 TAB 1/2 TO 1 HR PRIOR TO INTERCOURSE AS NEEDED. DO NOT USE NITROGLYCERIN 24 HR PRIOR TO USE OR 48 HRS AFTER USE OF CIALIS. 11/25/19 11/24/20  McKenzie, 11/27/19, MD  tadalafil, PAH, (ADCIRCA) 20 MG tablet TAKE 1/2 TO 1 TABLET BY MOUTH 1 HOUR PRIOR TO INTERCOURSE AS NEEDED. DO NOT USE NITROGLYCERIN 24HR PRIOR TO USE OR 48HR AFTER USE OF CIALIS Patient not taking: Reported on 11/25/2019 06/23/19   11/27/2019, DO  tadalafil, PAH, (ADCIRCA) 20 MG  tablet TAKE 1 TABLET BY MOUTH 1/2 TO 1 HOUR PRIOR TO INTERCOURSE AS NEEDED. DO NOT USE NITROGLYCERIN 24 HRS PRIOR TO USE OR 48 HRS AFTER USE 11/25/19 11/24/20  11/27/19, MD    Allergies    Atorvastatin  Review of Systems   Review of Systems  Constitutional: Negative for diaphoresis.  Respiratory: Negative for shortness of breath.   Cardiovascular: Positive for chest pain.  Gastrointestinal: Negative for vomiting.  Neurological: Negative for dizziness.  All other systems reviewed and are negative.   Physical Exam Updated Vital Signs BP (!) 165/90 (BP Location: Right Arm)   Pulse 63   Temp 98.2 F (36.8 C) (Oral)   Resp 18   Ht 1.778 m (5\' 10" )   Wt  115.2 kg   SpO2 100%   BMI 36.45 kg/m   Physical Exam CONSTITUTIONAL: Well developed/well nourished HEAD: Normocephalic/atraumatic EYES: EOMI/PERRL ENMT: Mucous membranes moist NECK: supple no meningeal signs SPINE/BACK:entire spine nontender CV: S1/S2 noted, no murmurs/rubs/gallops noted LUNGS: Lungs are clear to auscultation bilaterally, no apparent distress ABDOMEN: soft, nontender, no rebound or guarding, bowel sounds noted throughout abdomen GU:no cva tenderness NEURO: Pt is awake/alert/appropriate, moves all extremitiesx4.  No facial droop.   EXTREMITIES: pulses normal/equalx4, full ROM SKIN: warm, color normal PSYCH: no abnormalities of mood noted, alert and oriented to situation  ED Results / Procedures / Treatments   Labs (all labs ordered are listed, but only abnormal results are displayed) Labs Reviewed  CBC - Abnormal; Notable for the following components:      Result Value   RBC 4.17 (*)    Hemoglobin 11.9 (*)    HCT 38.1 (*)    All other components within normal limits  BASIC METABOLIC PANEL  TROPONIN I (HIGH SENSITIVITY)  TROPONIN I (HIGH SENSITIVITY)    EKG EKG Interpretation  Date/Time:  Saturday August 05 2020 15:24:32 EDT Ventricular Rate:  62 PR Interval:  186 QRS Duration: 76 QT  Interval:  424 QTC Calculation: 430 R Axis:   69 Text Interpretation: Normal sinus rhythm Possible Anterior infarct , age undetermined Abnormal ECG Confirmed by Zadie Rhine ([id number]) on 08/06/2020 12:40:27 AM   Radiology DG Chest 2 View  Result Date: 08/05/2020 CLINICAL DATA:  Chest pain for 4 days, history coronary artery disease post MI, hypertension, former smoker EXAM: CHEST - 2 VIEW COMPARISON:  01/28/2018 FINDINGS: Normal heart size, mediastinal contours, and pulmonary vascularity. Lungs clear. No pulmonary infiltrate, pleural effusion, or pneumothorax. Osseous structures unremarkable. IMPRESSION: No acute abnormalities. Electronically Signed   By: Ulyses Southward M.D.   On: 08/05/2020 15:51    Procedures Procedures   Medications Ordered in ED Medications - No data to display  ED Course  I have reviewed the triage vital signs and the nursing notes.  Pertinent labs & imaging results that were available during my care of the patient were reviewed by me and considered in my medical decision making (see chart for details).    MDM Rules/Calculators/A&P                          Patient with known history of CAD presents for chest pain.  He has been chest pain-free for over 24 hours.  He reports after arriving home and taking his medications things began to improve. His labs are reassuring.  There are no acute EKG changes. My suspicion for ACS/PE/dissection is low.  Patient is very well-appearing and ambulatory. We discussed strict ER return precautions.  He will follow-up with his cardiologist Final Clinical Impression(s) / ED Diagnoses Final diagnoses:  Precordial pain    Rx / DC Orders ED Discharge Orders    None       Zadie Rhine, MD 08/06/20 [phone]

## 2020-08-06 NOTE — Discharge Instructions (Addendum)

## 2020-08-18 IMAGING — CR DG CHEST 2V
2 series · 2 of 2 positions shown · non-contrast
Comparison: 01/10/2018, additional priors

CLINICAL DATA: Cough and congestion.

EXAM:
CHEST - 2 VIEW

[w chest lat]
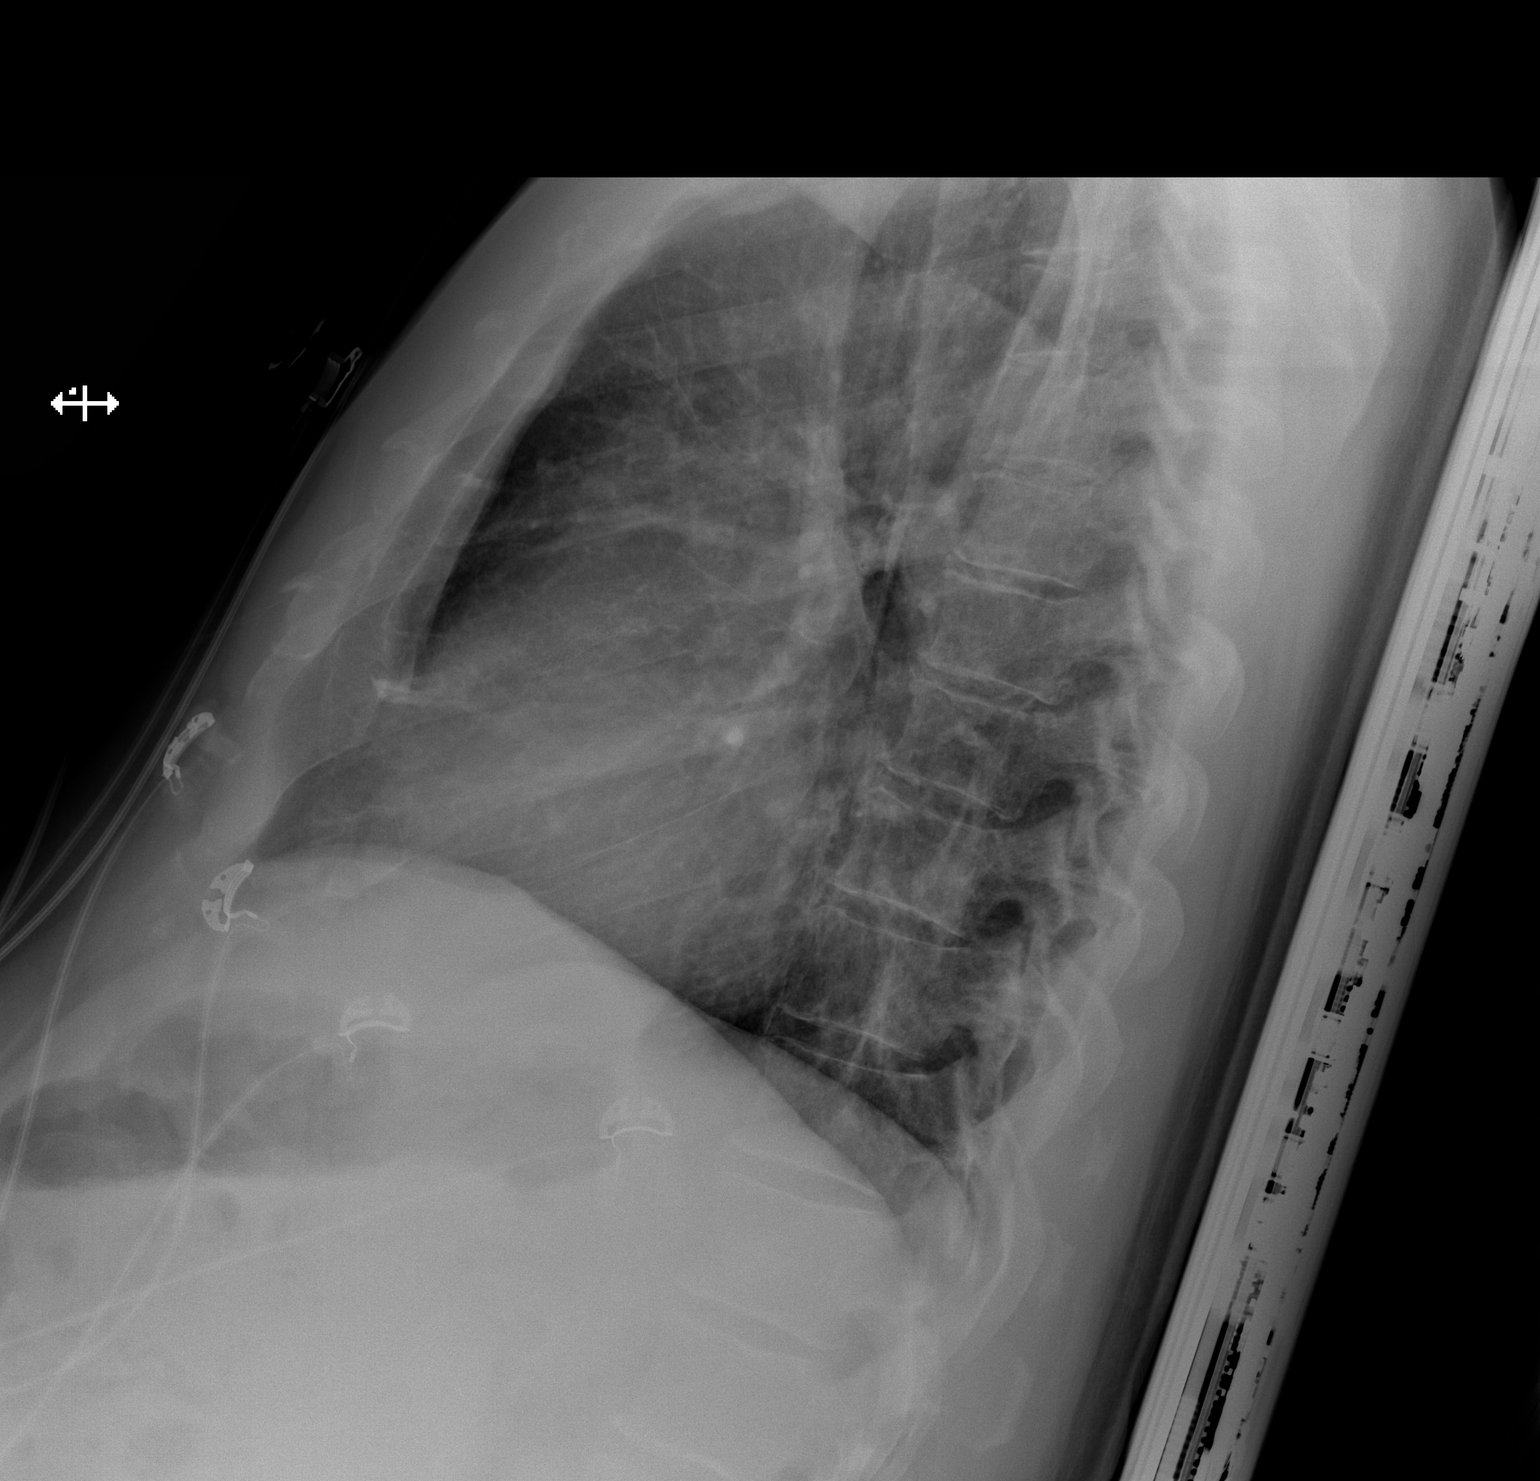

[x chest ap]
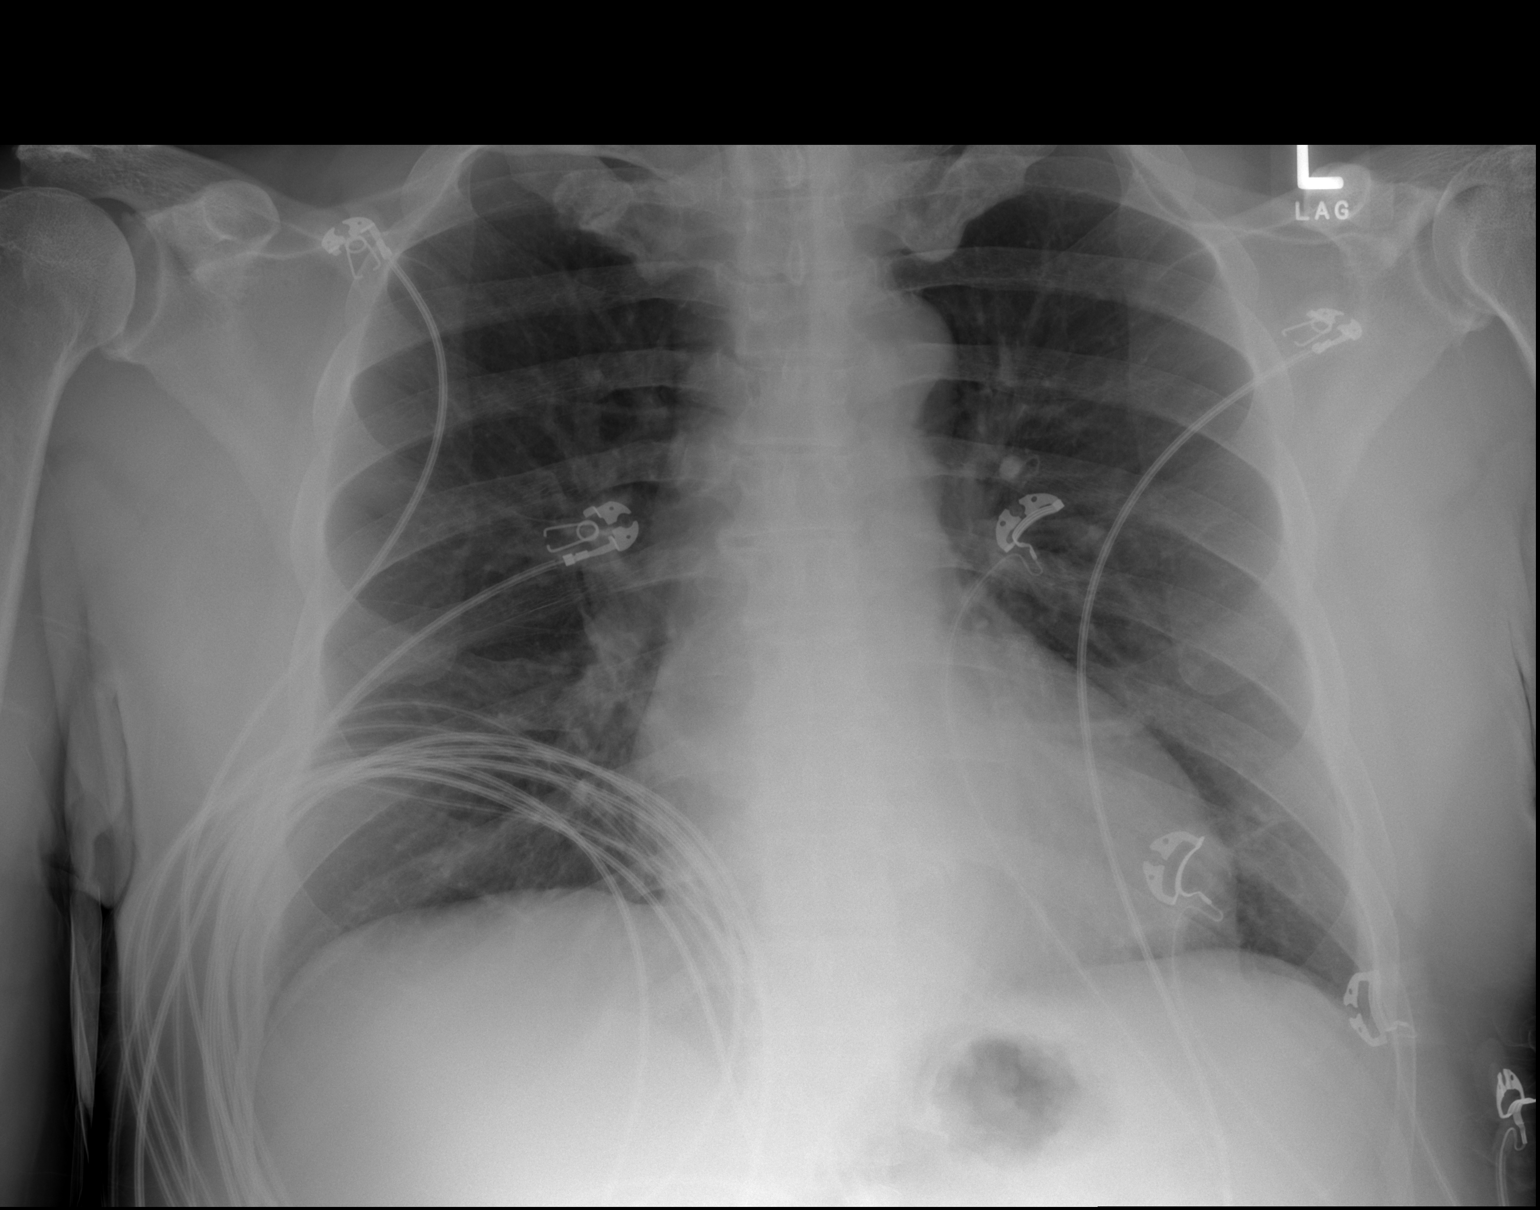

[2 of 2 positions shown; findings below may reference images not displayed]

FINDINGS: The cardiomediastinal contours are unchanged with borderline
cardiomegaly. Coronary stent. Resolved bibasilar opacities from
prior. Pulmonary vasculature is normal. No consolidation, pleural
effusion, or pneumothorax. No acute osseous abnormalities are seen.
IMPRESSION: No acute findings. Resolved bibasilar opacities from radiographs
earlier this month.

## 2020-08-29 ENCOUNTER — Other Ambulatory Visit: Payer: Self-pay

## 2020-08-29 ENCOUNTER — Other Ambulatory Visit: Payer: Self-pay | Admitting: Cardiology

## 2020-08-29 DIAGNOSIS — E785 Hyperlipidemia, unspecified: Secondary | ICD-10-CM

## 2020-08-29 MED ORDER — ROSUVASTATIN CALCIUM 40 MG PO TABS
ORAL_TABLET | Freq: Every day | ORAL | 1 refills | Status: DC
  Filled 2020-08-29: qty 30, 30d supply, fill #0
  Filled 2020-11-10: qty 30, 30d supply, fill #1
  Filled 2020-12-21: qty 30, 30d supply, fill #2
  Filled 2021-01-21: qty 30, 30d supply, fill #3
  Filled 2021-03-07: qty 30, 30d supply, fill #4
  Filled 2021-03-08 – 2021-04-06 (×2): qty 30, 30d supply, fill #0
  Filled 2021-04-06: qty 30, 30d supply, fill #1

## 2020-08-29 MED FILL — Losartan Potassium Tab 100 MG: ORAL | 30 days supply | Qty: 30 | Fill #2 | Status: AC

## 2020-08-29 MED FILL — Clopidogrel Bisulfate Tab 75 MG (Base Equiv): ORAL | 30 days supply | Qty: 30 | Fill #2 | Status: AC

## 2020-08-29 MED FILL — Carvedilol Tab 12.5 MG: ORAL | 30 days supply | Qty: 75 | Fill #2 | Status: AC

## 2020-08-29 MED FILL — Tadalafil Tab 20 MG (PAH): ORAL | 30 days supply | Qty: 10 | Fill #2 | Status: AC

## 2020-08-30 ENCOUNTER — Other Ambulatory Visit: Payer: Self-pay

## 2020-09-15 ENCOUNTER — Other Ambulatory Visit: Payer: Self-pay

## 2020-09-15 ENCOUNTER — Ambulatory Visit (INDEPENDENT_AMBULATORY_CARE_PROVIDER_SITE_OTHER): Payer: No Typology Code available for payment source | Admitting: Urology

## 2020-09-15 ENCOUNTER — Encounter: Payer: Self-pay | Admitting: Urology

## 2020-09-15 VITALS — BP 160/102 | HR 83 | Temp 98.6°F

## 2020-09-15 DIAGNOSIS — N529 Male erectile dysfunction, unspecified: Secondary | ICD-10-CM

## 2020-09-15 LAB — URINALYSIS, ROUTINE W REFLEX MICROSCOPIC
Bilirubin, UA: NEGATIVE
Glucose, UA: NEGATIVE
Ketones, UA: NEGATIVE
Nitrite, UA: NEGATIVE
Specific Gravity, UA: >1.03 — ABNORMAL HIGH (ref 1.005–1.030)
Urobilinogen, Ur: 1 mg/dL (ref 0.2–1.0)
pH, UA: 6 (ref 5.0–7.5)

## 2020-09-15 LAB — MICROSCOPIC EXAMINATION

## 2020-09-15 MED ORDER — AMBULATORY NON FORMULARY MEDICATION
0.2000 mL | 5 refills | Status: DC | PRN
Start: 2020-09-15 — End: 2020-09-15

## 2020-09-15 MED ORDER — AMBULATORY NON FORMULARY MEDICATION: 0.2000 mL | 5 refills | Status: AC | PRN

## 2020-09-15 NOTE — Progress Notes (Signed)
09/15/2020 12:00 PM   Rodney Lloyd 31-Oct-1970 [id number]  Referring provider: Bing Neighbors, FNP [address] [place],  [place] [address]  Erectile dysfunction  HPI: Rodney Lloyd is a 50yo here for followup for erectile dysfunction. He uses trimix 0.77ml PRN with good results. No pain with injections. Trimix gives his a firm erection. No other complaints today.    PMH: Past Medical History:  Diagnosis Date   Acute ST elevation myocardial infarction (STEMI) involving left anterior descending (LAD) coronary artery (HCC) 01/04/2019   Post VF Arrest (cocaine +) --> 100% p-mLAD after D1 (pLAD 60% & Ost Diag 75%) --> DES PCI to LAD & PTCA of ~small D1.  EF back up to 55-60% by Echo on d/c - distal anterior HK.   Alcohol abuse    CAD (coronary artery disease), native coronary artery    s/p DES PCI to LAD 01/2018 (Synergy DES 3 x 28 -- 3.6) acoss D1 with PTCA of jailed  D1.    Cocaine abuse (HCC)    HLD (hyperlipidemia)    Hypertension    Tobacco abuse     Surgical History: Past Surgical History:  Procedure Laterality Date   CORONARY/GRAFT ACUTE MI REVASCULARIZATION N/A 01/03/2018   Procedure: Coronary/Graft Acute MI Revascularization- CORONARY STENT INTERVENTION;  Surgeon: Marykay Lex, MD;  Location: [facility] INVASIVE CV LAB; DES PCI pLAD: Synergy DES 3 x 28 (3.6) & PTCA of OstD1 (jailed)    RIGHT/LEFT HEART CATH AND CORONARY ANGIOGRAPHY N/A 01/03/2018   Procedure: RIGHT/LEFT HEART CATH AND CORONARY ANGIOGRAPHY;  Surgeon: Marykay Lex, MD;  Location: [facility] INVASIVE CV LAB;  Service: Cardiovascular;;  (VF -> Ant STEMI): 100% mLAD, 80% 1st Diag & 65%ost RI --> DES PCI p-mLAD & PTCA of D1.  EF~45% w/ anterior anterolateral HK.  Minimally Elevated LVEDP & PCWP & normal RHC pressures   TRANSTHORACIC ECHOCARDIOGRAM  01/04/2018   (following Ant STEMI - VF arrest):  EF 55-60%. Mild HK of apical anterior wall c/w LAD ischemia. Gr 1 DD. Normal valves & chamber sizes.       Home  Medications:  Allergies as of 09/15/2020       Reactions   Atorvastatin Other (See Comments)   myalgias        Medication List        Accurate as of September 15, 2020 12:00 PM. If you have any questions, ask your nurse or doctor.          AMBULATORY NON FORMULARY MEDICATION 0.2 mLs by Intracavernosal route as needed. Medication Name: Trimix  PGE Pap 30mg  Phent 1mg    carvedilol 12.5 MG tablet Commonly known as: COREG TAKE 1 TABLET (12.5 MG TOTAL) BY MOUTH EVERY MORNING AND 1.5 TABLETS (18.75 MG TOTAL) AT BEDTIME.   chlorthalidone 25 MG tablet Commonly known as: HYGROTON TAKE 1 TABLET (25 MG TOTAL) BY MOUTH DAILY.   clopidogrel 75 MG tablet Commonly known as: PLAVIX TAKE 1 TABLET (75 MG TOTAL) BY MOUTH DAILY.   erythromycin ophthalmic ointment Place a 1/2 inch ribbon of ointment into the lower eyelid. (left eye) for the next 1 week.   losartan 100 MG tablet Commonly known as: COZAAR TAKE 1 TABLET (100 MG TOTAL) BY MOUTH DAILY.   nitroGLYCERIN 0.4 MG SL tablet Commonly known as: NITROSTAT Place 1 tablet (0.4 mg total) under the tongue every 5 (five) minutes as needed for chest pain.   rosuvastatin 40 MG tablet Commonly known as: CRESTOR Take by mouth daily.  tadalafil (PAH) 20 MG tablet Commonly known as: ADCIRCA TAKE 1/2 TO 1 TABLET BY MOUTH 1 HOUR PRIOR TO INTERCOURSE AS NEEDED. DO NOT USE NITROGLYCERIN 24HR PRIOR TO USE OR 48HR AFTER USE OF CIALIS   tadalafil (PAH) 20 MG tablet Commonly known as: ADCIRCA TAKE 1 TABLET BY MOUTH 1/2 TO 1 HOUR PRIOR TO INTERCOURSE AS NEEDED. DO NOT USE NITROGLYCERIN 24 HRS PRIOR TO USE OR 48 HRS AFTER USE   tadalafil 20 MG tablet Commonly known as: CIALIS TAKE 1 TAB 1/2 TO 1 HR PRIOR TO INTERCOURSE AS NEEDED. DO NOT USE NITROGLYCERIN 24 HR PRIOR TO USE OR 48 HRS AFTER USE OF CIALIS.        Allergies:  Allergies  Allergen Reactions   Atorvastatin Other (See Comments)    myalgias    Family History: Family  History  Problem Relation Age of Onset   Hyperlipidemia Mother    Heart disease Father     Social History:  reports that he quit smoking about 2 years ago. His smoking use included cigarettes. He smoked an average of .1 packs per day. He has never used smokeless tobacco. He reports previous alcohol use. He reports previous drug use.  ROS: All other review of systems were reviewed and are negative except what is noted above in HPI  Physical Exam: BP (!) 160/102   Pulse 83   Temp 98.6 F (37 C)   Constitutional:  Alert and oriented, No acute distress. HEENT: [place] AT, moist mucus membranes.  Trachea midline, no masses. Cardiovascular: No clubbing, cyanosis, or edema. Respiratory: Normal respiratory effort, no increased work of breathing. GI: Abdomen is soft, nontender, nondistended, no abdominal masses GU: No CVA tenderness.  Lymph: No cervical or inguinal lymphadenopathy. Skin: No rashes, bruises or suspicious lesions. Neurologic: Grossly intact, no focal deficits, moving all 4 extremities. Psychiatric: Normal mood and affect.  Laboratory Data: Lab Results  Component Value Date   WBC 6.3 08/05/2020   HGB 11.9 (L) 08/05/2020   HCT 38.1 (L) 08/05/2020   MCV 91.4 08/05/2020   PLT 212 08/05/2020    Lab Results  Component Value Date   CREATININE 1.12 08/05/2020    No results found for: PSA  No results found for: TESTOSTERONE  Lab Results  Component Value Date   HGBA1C 5.7 (H) 01/06/2018    Urinalysis    Component Value Date/Time   COLORURINE YELLOW 01/03/2018 1624   APPEARANCEUR CLEAR 01/03/2018 1624   LABSPEC 1.045 (H) 01/03/2018 1624   PHURINE 5.0 01/03/2018 1624   GLUCOSEU NEGATIVE 01/03/2018 1624   HGBUR LARGE (A) 01/03/2018 1624   BILIRUBINUR NEGATIVE 01/03/2018 1624   KETONESUR NEGATIVE 01/03/2018 1624   PROTEINUR 30 (A) 01/03/2018 1624   NITRITE NEGATIVE 01/03/2018 1624   LEUKOCYTESUR NEGATIVE 01/03/2018 1624    Lab Results  Component Value Date    BACTERIA FEW (A) 01/03/2018    Pertinent Imaging:  Results for orders placed during the hospital encounter of 01/03/18  DG Abd 1 View  Narrative CLINICAL DATA:  NG tube placement.  EXAM: ABDOMEN - 1 VIEW  COMPARISON:  01/03/2018  FINDINGS: The NG tube tip is in the antral region of the stomach. External pacer paddles are noted. The lung bases are grossly clear. No obvious free air is identified  IMPRESSION: The NG tube tip is in the antral region of the stomach.   Electronically Signed By: Rudie Meyer M.D. On: 01/07/2018 08:45  No results found for this or any previous visit.  No results found for this or any previous visit.  No results found for this or any previous visit.  No results found for this or any previous visit.  No results found for this or any previous visit.  No results found for this or any previous visit.  No results found for this or any previous visit.   Assessment & Plan:    1. Erectile dysfunction, unspecified erectile dysfunction type -Continue trimix prn - Urinalysis, Routine w reflex microscopic   No follow-ups on file.  Wilkie Aye, MD  [facility] [place]

## 2020-09-15 NOTE — Patient Instructions (Signed)
Erectile Dysfunction Erectile dysfunction (ED) is the inability to get or keep an erection in order to have sexual intercourse. ED is considered a symptom of an underlying disorder and not considered a disease. Erectile dysfunction may include: Inability to get an erection. Lack of enough hardness of the erection to allow penetration. Loss of the erection before sex is finished. What are the causes? This condition may be caused by: Certain medicines, such as: Pain relievers. Antihistamines. Antidepressants. Blood pressure medicines. Water pills (diuretics). Ulcer medicines. Muscle relaxants. Drugs. Excessive drinking. Psychological causes, such as: Anxiety. Depression. Sadness. Exhaustion. Performance fear. Stress. Physical causes, such as: Artery problems. This may include diabetes, smoking, liver disease, or atherosclerosis. High blood pressure. Hormonal problems, such as low testosterone. Obesity. Nerve problems. This may include back or pelvic injuries, diabetes mellitus, multiple sclerosis, or Parkinson's disease. What are the signs or symptoms? Symptoms of this condition include: Inability to get an erection. Lack of enough hardness of the erection to allow penetration. Loss of the erection before sex is finished. Normal erections at some times, but with frequent unsatisfactory episodes. Low sexual satisfaction in either partner due to erection problems. A curved penis occurring with erection. The curve may cause pain or the penis may be too curved to allow for intercourse. Never having nighttime erections. How is this diagnosed? This condition is often diagnosed by: Performing a physical exam to find other diseases or specific problems with the penis. Asking you detailed questions about the problem. Performing blood tests to check for diabetes mellitus or to measure hormone levels. Performing other tests to check for underlying health conditions. Performing an  ultrasound exam to check for scarring. Performing a test to check blood flow to the penis. Doing a sleep study at home to measure nighttime erections. How is this treated? This condition may be treated by: Medicine taken by mouth to help you achieve an erection (oral medicine). Hormone replacement therapy to replace low testosterone levels. Medicine that is injected into the penis. Your health care provider may instruct you how to give yourself these injections at home. Vacuum pump. This is a pump with a ring on it. The pump and ring are placed on the penis and used to create pressure that helps the penis become erect. Penile implant surgery. In this procedure, you may receive: An inflatable implant. This consists of cylinders, a pump, and a reservoir. The cylinders can be inflated with a fluid that helps to create an erection, and they can be deflated after intercourse. A semi-rigid implant. This consists of two silicone rubber rods. The rods provide some rigidity. They are also flexible, so the penis can both curve downward in its normal position and become straight for sexual intercourse. Blood vessel surgery, to improve blood flow to the penis. During this procedure, a blood vessel from a different part of the body is placed into the penis to allow blood to flow around (bypass) damaged or blocked blood vessels. Lifestyle changes, such as exercising more, losing weight, and quitting smoking. Follow these instructions at home: Medicines  Take over-the-counter and prescription medicines only as told by your health care provider. Do not increase the dosage without first discussing it with your health care provider. If you are using self-injections, perform injections as directed by your health care provider. Make sure to avoid any veins that are on the surface of the penis. After giving an injection, apply pressure to the injection site for 5 minutes.  General instructions Exercise regularly, as    directed by your health care provider. Work with your health care provider to lose weight, if needed. Do not use any products that contain nicotine or tobacco, such as cigarettes and e-cigarettes. If you need help quitting, ask your health care provider. Before using a vacuum pump, read the instructions that come with the pump and discuss any questions with your health care provider. Keep all follow-up visits as told by your health care provider. This is important. Contact a health care provider if: You feel nauseous. You vomit. Get help right away if: You are taking oral or injectable medicines and you have an erection that lasts longer than 4 hours. If your health care provider is unavailable, go to the nearest emergency room for evaluation. An erection that lasts much longer than 4 hours can result in permanent damage to your penis. You have severe pain in your groin or abdomen. You develop redness or severe swelling of your penis. You have redness spreading up into your groin or lower abdomen. You are unable to urinate. You experience chest pain or a rapid heart beat (palpitations) after taking oral medicines. Summary Erectile dysfunction (ED) is the inability to get or keep an erection during sexual intercourse. This problem can usually be treated successfully. This condition is diagnosed based on a physical exam, your symptoms, and tests to determine the cause. Treatment varies depending on the cause and may include medicines, hormone therapy, surgery, or a vacuum pump. You may need follow-up visits to make sure that you are using your medicines or devices correctly. Get help right away if you are taking or injecting medicines and you have an erection that lasts longer than 4 hours. This information is not intended to replace advice given to you by your health care provider. Make sure you discuss any questions you have with your healthcare provider. Document Revised: 02/08/2020 Document  Reviewed: 11/05/2019 Elsevier Patient Education  2022 [employer]  

## 2020-09-15 NOTE — Progress Notes (Signed)
Urological Symptom Review  Patient is experiencing the following symptoms: Leakage of urine   Review of Systems  Gastrointestinal (upper)  : Indigestion/heartburn  Gastrointestinal (lower) : Negative for lower GI symptoms  Constitutional : Negative for symptoms  Skin: Negative for skin symptoms  Eyes: Blurred vision  Ear/Nose/Throat : Negative for Ear/Nose/Throat symptoms  Hematologic/Lymphatic: Negative for Hematologic/Lymphatic symptoms  Cardiovascular : Negative for cardiovascular symptoms  Respiratory : Negative for respiratory symptoms  Endocrine: Negative for endocrine symptoms  Musculoskeletal: Negative for musculoskeletal symptoms  Neurological: Headaches  Psychologic: Depression Anxiety

## 2020-09-18 ENCOUNTER — Other Ambulatory Visit: Payer: Self-pay

## 2020-10-04 ENCOUNTER — Other Ambulatory Visit: Payer: Self-pay

## 2020-10-04 ENCOUNTER — Other Ambulatory Visit: Payer: Self-pay | Admitting: Cardiology

## 2020-10-04 MED ORDER — LOSARTAN POTASSIUM 100 MG PO TABS
ORAL_TABLET | Freq: Every day | ORAL | 3 refills | Status: DC
Start: 2020-10-04 — End: 2021-03-07
  Filled 2020-10-04: qty 30, 30d supply, fill #0
  Filled 2020-11-10: qty 30, 30d supply, fill #1
  Filled 2020-12-21: qty 30, 30d supply, fill #2
  Filled 2021-01-21: qty 30, 30d supply, fill #3

## 2020-10-04 MED ORDER — CARVEDILOL 12.5 MG PO TABS
ORAL_TABLET | ORAL | 3 refills | Status: DC
  Filled 2020-10-04: qty 75, 30d supply, fill #0
  Filled 2020-11-10: qty 75, 30d supply, fill #1
  Filled 2020-12-21: qty 75, 30d supply, fill #2
  Filled 2021-01-21: qty 75, 30d supply, fill #3
  Filled 2021-03-07: qty 75, 30d supply, fill #4
  Filled 2021-03-08 – 2021-04-06 (×2): qty 75, 30d supply, fill #0
  Filled 2021-04-06 – 2021-05-08 (×2): qty 75, 30d supply, fill #1
  Filled 2021-06-19: qty 75, 30d supply, fill #2
  Filled 2021-08-01: qty 75, 30d supply, fill #3
  Filled 2021-08-01: qty 225, 90d supply, fill #3

## 2020-10-04 MED ORDER — CLOPIDOGREL BISULFATE 75 MG PO TABS
ORAL_TABLET | Freq: Every day | ORAL | 3 refills | Status: DC
  Filled 2020-10-04: qty 30, 30d supply, fill #0
  Filled 2020-11-10: qty 30, 30d supply, fill #1
  Filled 2020-12-21: qty 30, 30d supply, fill #2
  Filled 2021-01-21: qty 30, 30d supply, fill #3
  Filled 2021-03-07: qty 30, 30d supply, fill #4
  Filled 2021-03-08 – 2021-04-06 (×2): qty 30, 30d supply, fill #0
  Filled 2021-04-06 – 2021-05-08 (×2): qty 30, 30d supply, fill #1
  Filled 2021-06-19: qty 30, 30d supply, fill #2
  Filled 2021-08-01: qty 30, 30d supply, fill #3
  Filled 2021-08-01: qty 90, 90d supply, fill #3

## 2020-10-04 MED FILL — Tadalafil Tab 20 MG (PAH): ORAL | 30 days supply | Qty: 10 | Fill #3 | Status: AC

## 2020-11-10 ENCOUNTER — Other Ambulatory Visit: Payer: Self-pay

## 2020-11-10 MED FILL — Tadalafil Tab 20 MG (PAH): ORAL | 30 days supply | Qty: 10 | Fill #4 | Status: AC

## 2020-11-17 IMAGING — CR DG KNEE COMPLETE 4+V*R*
4 series · 4 of 4 positions shown · non-contrast
Comparison: None.

CLINICAL DATA: Right knee pain worse over the past few weeks. No
injury.

EXAM:
RIGHT KNEE - COMPLETE 4+ VIEW

[w knee ap right]
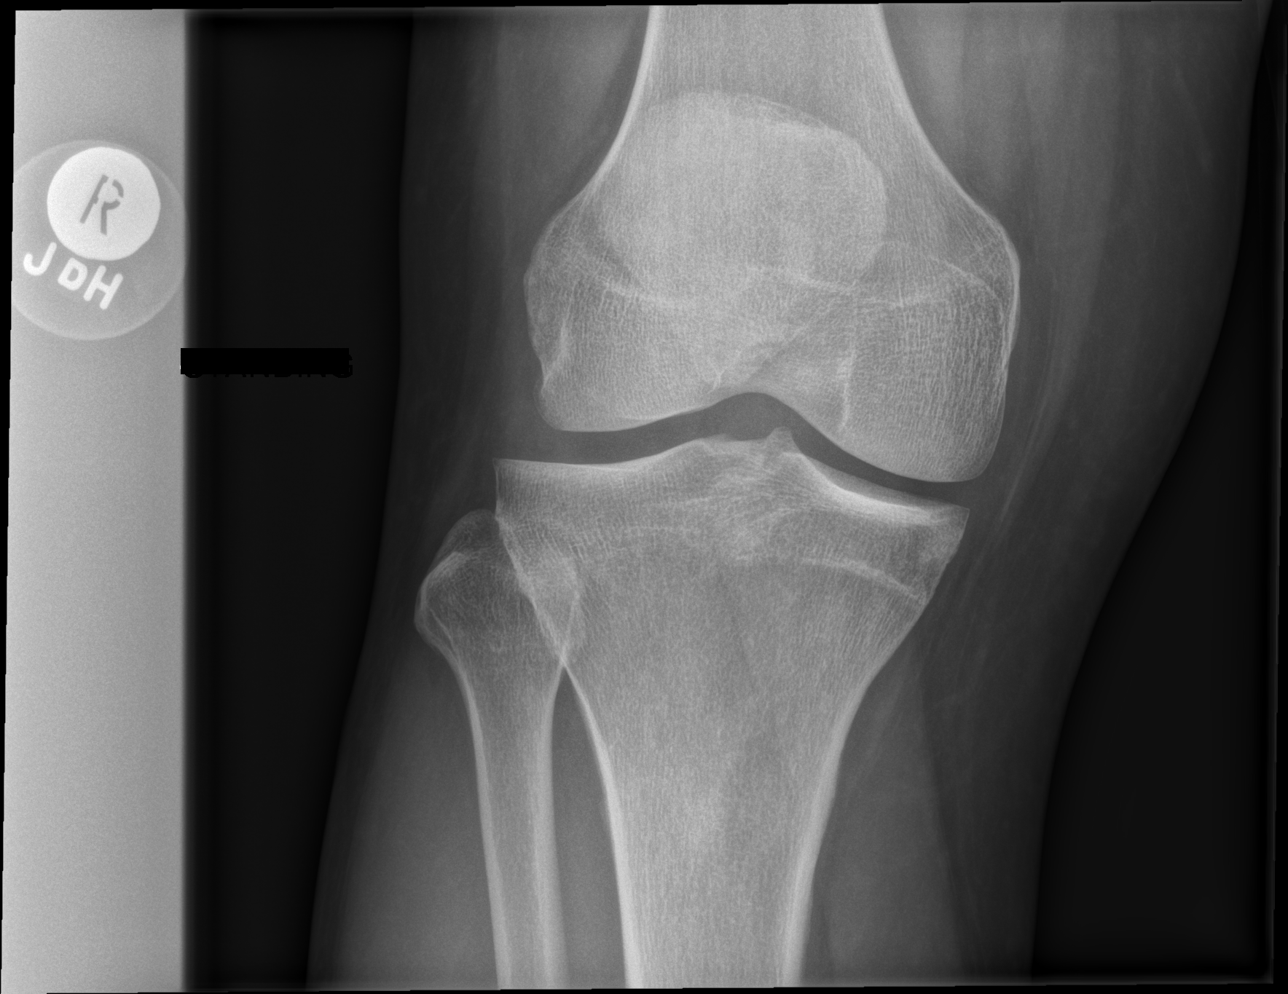

[w knee lat right]
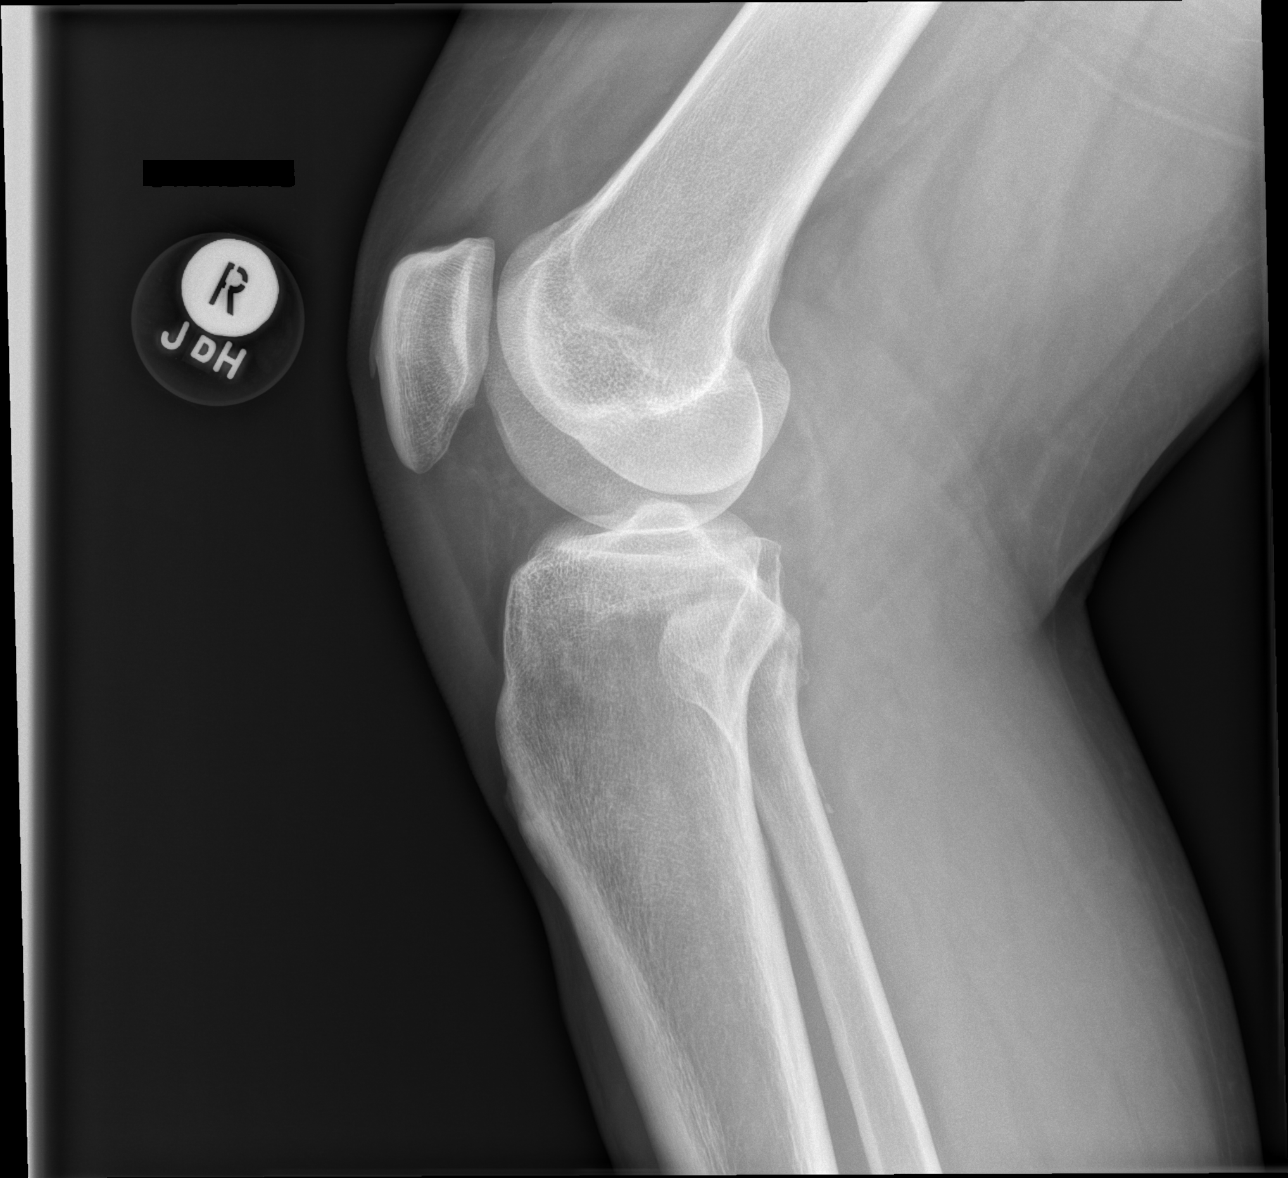

[x knee tunnel right]
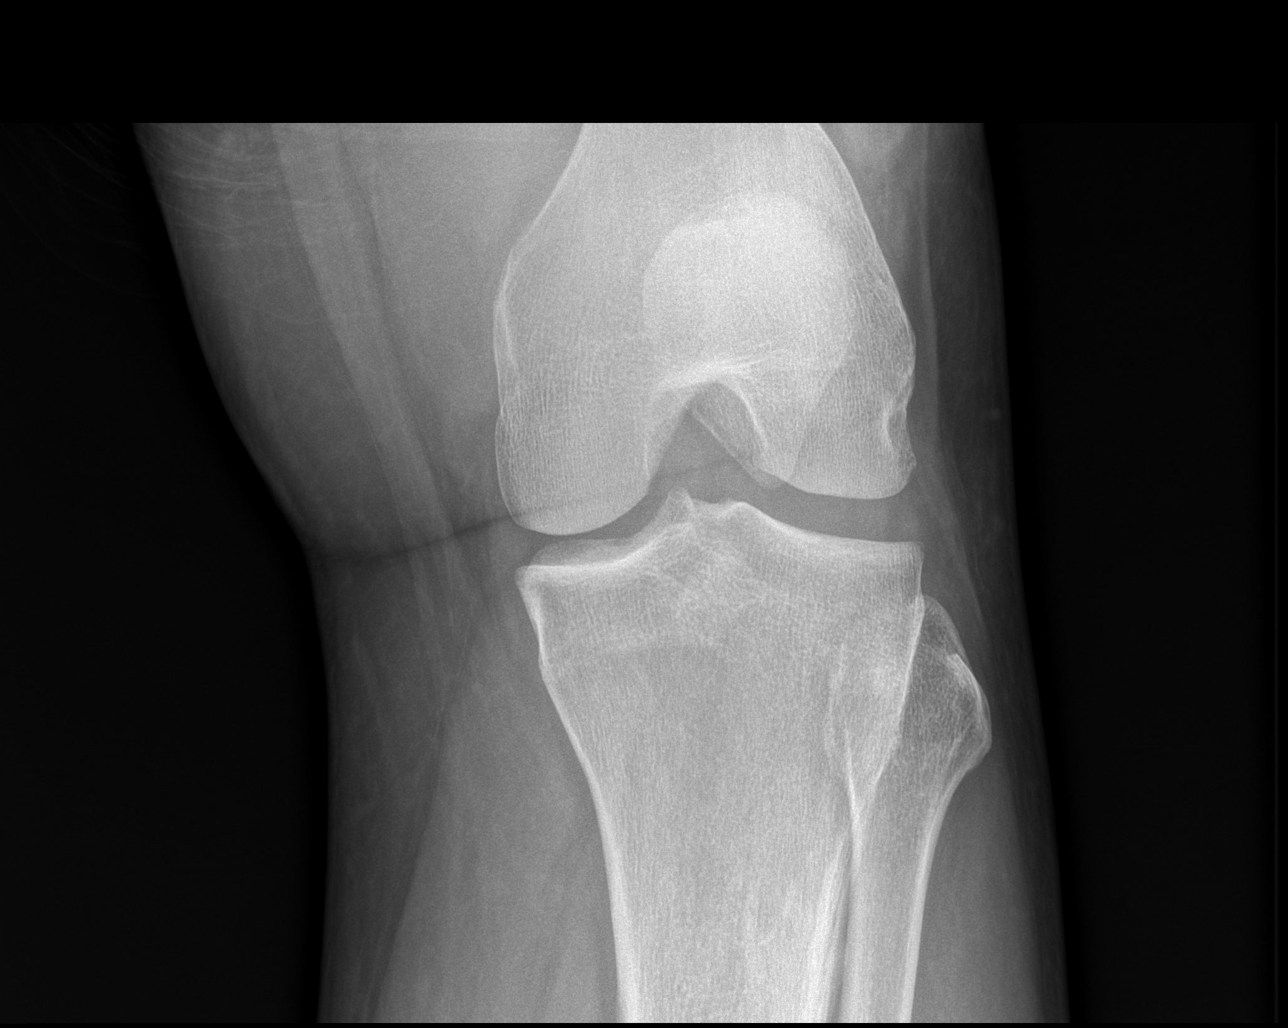

[x knee sunrise right]
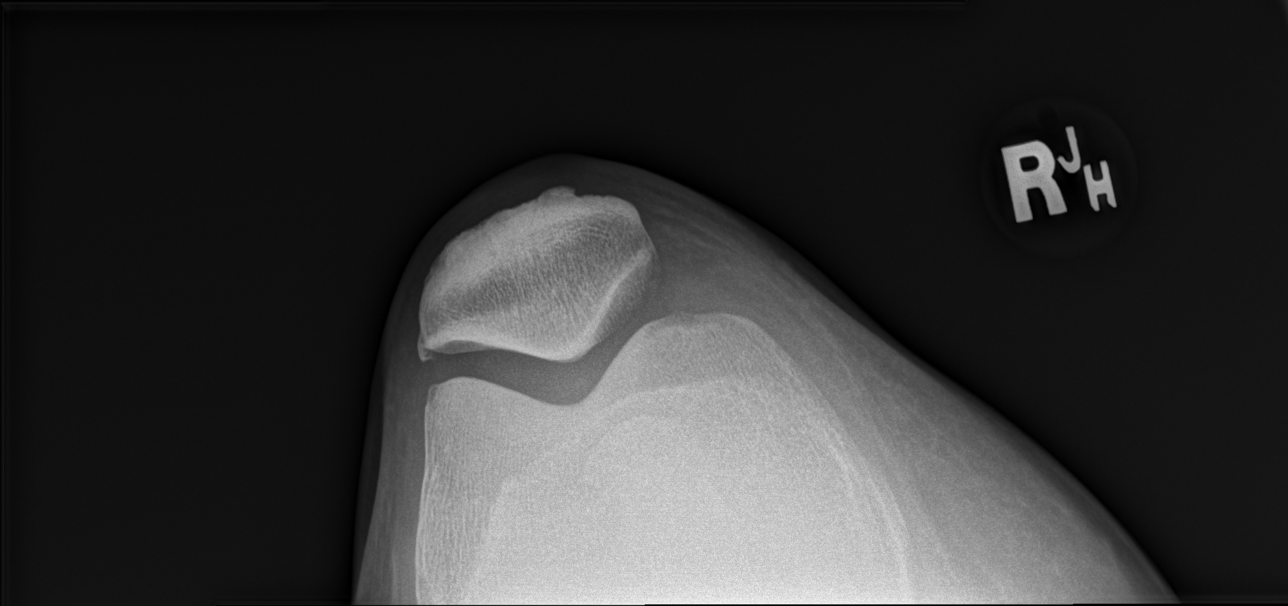

[4 of 4 positions shown; findings below may reference images not displayed]

FINDINGS: No evidence of fracture, dislocation, or joint effusion. No evidence
of arthropathy or other focal bone abnormality. Soft tissues are
unremarkable.
IMPRESSION: Negative.

## 2020-11-23 ENCOUNTER — Ambulatory Visit: Payer: No Typology Code available for payment source | Admitting: Urology

## 2020-11-24 ENCOUNTER — Ambulatory Visit: Payer: No Typology Code available for payment source | Admitting: Urology

## 2020-12-21 ENCOUNTER — Other Ambulatory Visit: Payer: Self-pay

## 2020-12-21 ENCOUNTER — Other Ambulatory Visit: Payer: Self-pay | Admitting: Urology

## 2020-12-22 ENCOUNTER — Other Ambulatory Visit: Payer: Self-pay

## 2021-01-04 ENCOUNTER — Other Ambulatory Visit: Payer: Self-pay

## 2021-01-04 MED ORDER — TADALAFIL (PAH) 20 MG PO TABS
ORAL_TABLET | ORAL | 9 refills | Status: DC
  Filled 2021-01-04: qty 10, 30d supply, fill #0
  Filled 2021-01-21 – 2021-03-07 (×2): qty 10, 30d supply, fill #1
  Filled 2021-03-08: qty 10, 30d supply, fill #0
  Filled 2021-04-06: qty 10, 30d supply, fill #1
  Filled 2021-04-06: qty 10, 30d supply, fill #0
  Filled 2021-05-08: qty 10, 30d supply, fill #1

## 2021-01-08 ENCOUNTER — Other Ambulatory Visit: Payer: Self-pay

## 2021-01-22 ENCOUNTER — Other Ambulatory Visit: Payer: Self-pay

## 2021-01-23 ENCOUNTER — Other Ambulatory Visit: Payer: Self-pay

## 2021-03-07 ENCOUNTER — Other Ambulatory Visit: Payer: Self-pay | Admitting: Cardiology

## 2021-03-07 ENCOUNTER — Other Ambulatory Visit: Payer: Self-pay

## 2021-03-07 MED ORDER — LOSARTAN POTASSIUM 100 MG PO TABS
ORAL_TABLET | Freq: Every day | ORAL | 3 refills | Status: DC
  Filled 2021-03-07: qty 30, fill #0
  Filled 2021-03-08: qty 30, 30d supply, fill #0
  Filled 2021-04-06: qty 30, 30d supply, fill #1
  Filled 2021-04-06: qty 30, 30d supply, fill #0
  Filled 2021-05-08: qty 30, 30d supply, fill #1
  Filled 2021-06-19: qty 30, 30d supply, fill #2

## 2021-03-08 ENCOUNTER — Other Ambulatory Visit (HOSPITAL_COMMUNITY): Payer: Self-pay

## 2021-03-10 ENCOUNTER — Other Ambulatory Visit: Payer: Self-pay

## 2021-04-06 ENCOUNTER — Other Ambulatory Visit: Payer: Self-pay

## 2021-04-06 ENCOUNTER — Other Ambulatory Visit (HOSPITAL_COMMUNITY): Payer: Self-pay

## 2021-05-08 ENCOUNTER — Other Ambulatory Visit: Payer: Self-pay | Admitting: Cardiology

## 2021-05-08 ENCOUNTER — Other Ambulatory Visit: Payer: Self-pay

## 2021-05-08 DIAGNOSIS — E785 Hyperlipidemia, unspecified: Secondary | ICD-10-CM

## 2021-05-15 ENCOUNTER — Emergency Department (HOSPITAL_COMMUNITY)
Admission: EM | Admit: 2021-05-15 | Discharge: 2021-05-15 | Disposition: A | Discharge status: Home / Self Care | Payer: 59 | Attending: Emergency Medicine | Admitting: Emergency Medicine

## 2021-05-15 ENCOUNTER — Other Ambulatory Visit: Payer: Self-pay

## 2021-05-15 ENCOUNTER — Emergency Department (HOSPITAL_COMMUNITY): Payer: 59

## 2021-05-15 DIAGNOSIS — I2510 Atherosclerotic heart disease of native coronary artery without angina pectoris: Secondary | ICD-10-CM | POA: Diagnosis not present

## 2021-05-15 DIAGNOSIS — Z23 Encounter for immunization: Secondary | ICD-10-CM | POA: Insufficient documentation

## 2021-05-15 DIAGNOSIS — S0501XA Injury of conjunctiva and corneal abrasion without foreign body, right eye, initial encounter: Secondary | ICD-10-CM | POA: Diagnosis not present

## 2021-05-15 DIAGNOSIS — H1133 Conjunctival hemorrhage, bilateral: Secondary | ICD-10-CM

## 2021-05-15 DIAGNOSIS — I10 Essential (primary) hypertension: Secondary | ICD-10-CM | POA: Insufficient documentation

## 2021-05-15 DIAGNOSIS — S0502XA Injury of conjunctiva and corneal abrasion without foreign body, left eye, initial encounter: Secondary | ICD-10-CM | POA: Diagnosis not present

## 2021-05-15 DIAGNOSIS — S0500XA Injury of conjunctiva and corneal abrasion without foreign body, unspecified eye, initial encounter: Secondary | ICD-10-CM

## 2021-05-15 DIAGNOSIS — T07XXXA Unspecified multiple injuries, initial encounter: Secondary | ICD-10-CM

## 2021-05-15 DIAGNOSIS — S0990XA Unspecified injury of head, initial encounter: Secondary | ICD-10-CM | POA: Diagnosis present

## 2021-05-15 DIAGNOSIS — S0993XA Unspecified injury of face, initial encounter: Secondary | ICD-10-CM

## 2021-05-15 DIAGNOSIS — S61512A Laceration without foreign body of left wrist, initial encounter: Secondary | ICD-10-CM | POA: Diagnosis not present

## 2021-05-15 MED ORDER — ERYTHROMYCIN 5 MG/GM OP OINT
1.0000 "application " | TOPICAL_OINTMENT | Freq: Once | OPHTHALMIC | Status: AC
  Administered 2021-05-15: 1 via OPHTHALMIC
  Filled 2021-05-15: qty 3.5

## 2021-05-15 MED ORDER — TETANUS-DIPHTH-ACELL PERTUSSIS 5-2.5-18.5 LF-MCG/0.5 IM SUSY
0.5000 mL | PREFILLED_SYRINGE | Freq: Once | INTRAMUSCULAR | Status: AC
  Administered 2021-05-15: 0.5 mL via INTRAMUSCULAR
  Filled 2021-05-15: qty 0.5

## 2021-05-15 MED ORDER — FLUORESCEIN SODIUM 1 MG OP STRP
1.0000 | ORAL_STRIP | Freq: Once | OPHTHALMIC | Status: AC
  Administered 2021-05-15: 1 via OPHTHALMIC
  Filled 2021-05-15: qty 1

## 2021-05-15 MED ORDER — ERYTHROMYCIN 5 MG/GM OP OINT
TOPICAL_OINTMENT | OPHTHALMIC | 0 refills | Status: DC
  Filled 2021-05-15: qty 3.5, 20d supply, fill #0

## 2021-05-15 MED ORDER — TETRACAINE HCL 0.5 % OP SOLN
2.0000 [drp] | Freq: Once | OPHTHALMIC | Status: AC
  Administered 2021-05-15: 2 [drp] via OPHTHALMIC
  Filled 2021-05-15: qty 4

## 2021-05-15 NOTE — ED Notes (Signed)
Pt states that he consumer alcohol, mariajuana and cocaine tonight ?
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
## 2021-05-15 NOTE — ED Notes (Signed)
Patient transported to X-ray 
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
## 2021-05-15 NOTE — ED Notes (Signed)
Patient Alert and oriented to baseline. Stable and ambulatory to baseline. Patient verbalized understanding of the discharge instructions.  Patient belongings were taken by the patient.   

## 2021-05-15 NOTE — ED Notes (Signed)
Patient transported to CT 

## 2021-05-15 NOTE — ED Notes (Signed)
Made FU appt w/Dr Sherrine Maples for patient on 05/16/2021 at [facility]. In [place] at 10:15am ?

## 2021-05-20 ENCOUNTER — Observation Stay (HOSPITAL_COMMUNITY)

## 2021-05-20 ENCOUNTER — Emergency Department (HOSPITAL_COMMUNITY)

## 2021-05-20 ENCOUNTER — Other Ambulatory Visit: Payer: Self-pay

## 2021-05-20 ENCOUNTER — Encounter (HOSPITAL_COMMUNITY): Payer: Self-pay | Admitting: Pharmacy Technician

## 2021-05-20 ENCOUNTER — Inpatient Hospital Stay (HOSPITAL_COMMUNITY)
Admission: EM | Admit: 2021-05-20 | Discharge: 2021-05-22 | DRG: 379 | Attending: Internal Medicine | Admitting: Internal Medicine

## 2021-05-20 DIAGNOSIS — K5733 Diverticulitis of large intestine without perforation or abscess with bleeding: Principal | ICD-10-CM | POA: Diagnosis present

## 2021-05-20 DIAGNOSIS — H538 Other visual disturbances: Secondary | ICD-10-CM

## 2021-05-20 DIAGNOSIS — S61512D Laceration without foreign body of left wrist, subsequent encounter: Secondary | ICD-10-CM

## 2021-05-20 DIAGNOSIS — I252 Old myocardial infarction: Secondary | ICD-10-CM

## 2021-05-20 DIAGNOSIS — Z7902 Long term (current) use of antithrombotics/antiplatelets: Secondary | ICD-10-CM

## 2021-05-20 DIAGNOSIS — K5792 Diverticulitis of intestine, part unspecified, without perforation or abscess without bleeding: Secondary | ICD-10-CM

## 2021-05-20 DIAGNOSIS — Z800 Family history of malignant neoplasm of digestive organs: Secondary | ICD-10-CM

## 2021-05-20 DIAGNOSIS — I700 Atherosclerosis of aorta: Secondary | ICD-10-CM | POA: Diagnosis present

## 2021-05-20 DIAGNOSIS — K922 Gastrointestinal hemorrhage, unspecified: Principal | ICD-10-CM | POA: Diagnosis present

## 2021-05-20 DIAGNOSIS — S0591XD Unspecified injury of right eye and orbit, subsequent encounter: Secondary | ICD-10-CM

## 2021-05-20 DIAGNOSIS — E785 Hyperlipidemia, unspecified: Secondary | ICD-10-CM | POA: Diagnosis present

## 2021-05-20 DIAGNOSIS — I2510 Atherosclerotic heart disease of native coronary artery without angina pectoris: Secondary | ICD-10-CM | POA: Diagnosis present

## 2021-05-20 DIAGNOSIS — Z8249 Family history of ischemic heart disease and other diseases of the circulatory system: Secondary | ICD-10-CM

## 2021-05-20 DIAGNOSIS — Z20822 Contact with and (suspected) exposure to covid-19: Secondary | ICD-10-CM | POA: Diagnosis present

## 2021-05-20 DIAGNOSIS — Z79899 Other long term (current) drug therapy: Secondary | ICD-10-CM

## 2021-05-20 DIAGNOSIS — F1410 Cocaine abuse, uncomplicated: Secondary | ICD-10-CM | POA: Diagnosis present

## 2021-05-20 DIAGNOSIS — S0532XA Ocular laceration without prolapse or loss of intraocular tissue, left eye, initial encounter: Secondary | ICD-10-CM

## 2021-05-20 DIAGNOSIS — H53143 Visual discomfort, bilateral: Secondary | ICD-10-CM

## 2021-05-20 DIAGNOSIS — S6992XA Unspecified injury of left wrist, hand and finger(s), initial encounter: Secondary | ICD-10-CM | POA: Diagnosis present

## 2021-05-20 DIAGNOSIS — Z7151 Drug abuse counseling and surveillance of drug abuser: Secondary | ICD-10-CM

## 2021-05-20 DIAGNOSIS — H1132 Conjunctival hemorrhage, left eye: Secondary | ICD-10-CM

## 2021-05-20 DIAGNOSIS — S0592XD Unspecified injury of left eye and orbit, subsequent encounter: Secondary | ICD-10-CM

## 2021-05-20 DIAGNOSIS — Z87891 Personal history of nicotine dependence: Secondary | ICD-10-CM

## 2021-05-20 DIAGNOSIS — I10 Essential (primary) hypertension: Secondary | ICD-10-CM | POA: Diagnosis present

## 2021-05-20 DIAGNOSIS — H1133 Conjunctival hemorrhage, bilateral: Secondary | ICD-10-CM

## 2021-05-20 DIAGNOSIS — Z8674 Personal history of sudden cardiac arrest: Secondary | ICD-10-CM

## 2021-05-20 DIAGNOSIS — R110 Nausea: Secondary | ICD-10-CM | POA: Diagnosis present

## 2021-05-20 DIAGNOSIS — Z955 Presence of coronary angioplasty implant and graft: Secondary | ICD-10-CM

## 2021-05-20 DIAGNOSIS — H5713 Ocular pain, bilateral: Secondary | ICD-10-CM

## 2021-05-20 DIAGNOSIS — Z720 Tobacco use: Secondary | ICD-10-CM | POA: Diagnosis present

## 2021-05-20 DIAGNOSIS — H40003 Preglaucoma, unspecified, bilateral: Secondary | ICD-10-CM

## 2021-05-20 DIAGNOSIS — Z83438 Family history of other disorder of lipoprotein metabolism and other lipidemia: Secondary | ICD-10-CM

## 2021-05-20 LAB — URINALYSIS, ROUTINE W REFLEX MICROSCOPIC
Bacteria, UA: NONE SEEN
Bilirubin Urine: NEGATIVE
Glucose, UA: NEGATIVE mg/dL
Ketones, ur: NEGATIVE mg/dL
Nitrite: NEGATIVE
Protein, ur: NEGATIVE mg/dL
Specific Gravity, Urine: 1.023 (ref 1.005–1.030)
pH: 5 (ref 5.0–8.0)

## 2021-05-20 LAB — TYPE AND SCREEN
ABO/RH(D): B POS
Antibody Screen: NEGATIVE

## 2021-05-20 LAB — COMPREHENSIVE METABOLIC PANEL
ALT: 13 U/L (ref 0–44)
AST: 16 U/L (ref 15–41)
Albumin: 3.7 g/dL (ref 3.5–5.0)
Alkaline Phosphatase: 56 U/L (ref 38–126)
Anion gap: 9 (ref 5–15)
BUN: 6 mg/dL (ref 6–20)
CO2: 24 mmol/L (ref 22–32)
Calcium: 9 mg/dL (ref 8.9–10.3)
Chloride: 108 mmol/L (ref 98–111)
Creatinine, Ser: 1 mg/dL (ref 0.61–1.24)
GFR, Estimated: >60 mL/min (ref >60)
Glucose, Bld: 104 mg/dL — ABNORMAL HIGH (ref 70–99)
Potassium: 3.8 mmol/L (ref 3.5–5.1)
Sodium: 141 mmol/L (ref 135–145)
Total Bilirubin: 0.7 mg/dL (ref 0.3–1.2)
Total Protein: 7 g/dL (ref 6.5–8.1)

## 2021-05-20 LAB — CBC WITH DIFFERENTIAL/PLATELET
Abs Immature Granulocytes: 0.02 10*3/uL (ref 0.00–0.07)
Basophils Absolute: 0 10*3/uL (ref 0.0–0.1)
Basophils Relative: 1 %
Eosinophils Absolute: 0.3 10*3/uL (ref 0.0–0.5)
Eosinophils Relative: 4 %
HCT: 41.5 % (ref 39.0–52.0)
Hemoglobin: 13.3 g/dL (ref 13.0–17.0)
Immature Granulocytes: 0 %
Lymphocytes Relative: 32 %
Lymphs Abs: 2.2 10*3/uL (ref 0.7–4.0)
MCH: 28.5 pg (ref 26.0–34.0)
MCHC: 32 g/dL (ref 30.0–36.0)
MCV: 89.1 fL (ref 80.0–100.0)
Monocytes Absolute: 0.7 10*3/uL (ref 0.1–1.0)
Monocytes Relative: 10 %
Neutro Abs: 3.6 10*3/uL (ref 1.7–7.7)
Neutrophils Relative %: 53 %
Platelets: 189 10*3/uL (ref 150–400)
RBC: 4.66 MIL/uL (ref 4.22–5.81)
RDW: 13.1 % (ref 11.5–15.5)
WBC: 6.8 10*3/uL (ref 4.0–10.5)
nRBC: 0 % (ref 0.0–0.2)

## 2021-05-20 LAB — CBC
HCT: 34.5 % — ABNORMAL LOW (ref 39.0–52.0)
HCT: 36 % — ABNORMAL LOW (ref 39.0–52.0)
Hemoglobin: 11.1 g/dL — ABNORMAL LOW (ref 13.0–17.0)
Hemoglobin: 11.8 g/dL — ABNORMAL LOW (ref 13.0–17.0)
MCH: 29 pg (ref 26.0–34.0)
MCH: 28.9 pg (ref 26.0–34.0)
MCHC: 32.2 g/dL (ref 30.0–36.0)
MCHC: 32.8 g/dL (ref 30.0–36.0)
MCV: 90.1 fL (ref 80.0–100.0)
MCV: 88.2 fL (ref 80.0–100.0)
Platelets: 155 10*3/uL (ref 150–400)
Platelets: 163 10*3/uL (ref 150–400)
RBC: 3.83 MIL/uL — ABNORMAL LOW (ref 4.22–5.81)
RBC: 4.08 MIL/uL — ABNORMAL LOW (ref 4.22–5.81)
RDW: 13.1 % (ref 11.5–15.5)
RDW: 13.1 % (ref 11.5–15.5)
WBC: 5.7 10*3/uL (ref 4.0–10.5)
WBC: 6.5 10*3/uL (ref 4.0–10.5)
nRBC: 0 % (ref 0.0–0.2)
nRBC: 0 % (ref 0.0–0.2)

## 2021-05-20 LAB — RESP PANEL BY RT-PCR (FLU A&B, COVID) ARPGX2
Influenza A by PCR: NEGATIVE
Influenza B by PCR: NEGATIVE
SARS Coronavirus 2 by RT PCR: NEGATIVE

## 2021-05-20 LAB — LIPASE, BLOOD: Lipase: 28 U/L (ref 11–51)

## 2021-05-20 LAB — POC OCCULT BLOOD, ED: Fecal Occult Bld: POSITIVE — AB

## 2021-05-20 LAB — HIV ANTIBODY (ROUTINE TESTING W REFLEX): HIV Screen 4th Generation wRfx: NONREACTIVE

## 2021-05-20 LAB — ABO/RH: ABO/RH(D): B POS

## 2021-05-20 MED ORDER — SODIUM CHLORIDE 0.9% FLUSH
3.0000 mL | Freq: Two times a day (BID) | INTRAVENOUS | Status: DC
  Administered 2021-05-20 – 2021-05-21 (×4): 3 mL via INTRAVENOUS

## 2021-05-20 MED ORDER — LACTATED RINGERS IV SOLN
INTRAVENOUS | Status: AC
Start: 2021-05-20 — End: 2021-05-21

## 2021-05-20 MED ORDER — CIPROFLOXACIN HCL 0.3 % OP SOLN
2.0000 [drp] | Freq: Two times a day (BID) | OPHTHALMIC | Status: DC
  Administered 2021-05-20 – 2021-05-21 (×2): 2 [drp] via OPHTHALMIC
  Filled 2021-05-20 (×2): qty 2.5

## 2021-05-20 MED ORDER — POLYVINYL ALCOHOL 1.4 % OP SOLN
2.0000 [drp] | Freq: Three times a day (TID) | OPHTHALMIC | Status: DC
  Administered 2021-05-20 – 2021-05-21 (×3): 2 [drp] via OPHTHALMIC
  Filled 2021-05-20: qty 15

## 2021-05-20 MED ORDER — ONDANSETRON HCL 4 MG PO TABS: 4.0000 mg | ORAL_TABLET | Freq: Four times a day (QID) | ORAL | Status: DC | PRN

## 2021-05-20 MED ORDER — MORPHINE SULFATE (PF) 4 MG/ML IV SOLN
4.0000 mg | Freq: Once | INTRAVENOUS | Status: AC
  Administered 2021-05-20: 4 mg via INTRAVENOUS
  Filled 2021-05-20: qty 1

## 2021-05-20 MED ORDER — SODIUM CHLORIDE 0.9 % IV SOLN: 250.0000 mL | INTRAVENOUS | Status: DC | PRN

## 2021-05-20 MED ORDER — ROSUVASTATIN CALCIUM 20 MG PO TABS
40.0000 mg | ORAL_TABLET | Freq: Every day | ORAL | Status: DC
  Administered 2021-05-20 – 2021-05-22 (×3): 40 mg via ORAL
  Filled 2021-05-20 (×3): qty 2

## 2021-05-20 MED ORDER — PIPERACILLIN-TAZOBACTAM 3.375 G IVPB
3.3750 g | Freq: Three times a day (TID) | INTRAVENOUS | Status: DC
  Administered 2021-05-20 – 2021-05-22 (×5): 3.375 g via INTRAVENOUS
  Filled 2021-05-20 (×5): qty 50

## 2021-05-20 MED ORDER — LACTATED RINGERS IV BOLUS
1000.0000 mL | Freq: Once | INTRAVENOUS | Status: AC
  Administered 2021-05-20: 1000 mL via INTRAVENOUS

## 2021-05-20 MED ORDER — SODIUM CHLORIDE 0.9% FLUSH: 3.0000 mL | INTRAVENOUS | Status: DC | PRN

## 2021-05-20 MED ORDER — ACETAMINOPHEN 650 MG RE SUPP: 650.0000 mg | Freq: Four times a day (QID) | RECTAL | Status: DC | PRN

## 2021-05-20 MED ORDER — OXYCODONE HCL 5 MG PO TABS: 5.0000 mg | ORAL_TABLET | ORAL | Status: DC | PRN

## 2021-05-20 MED ORDER — IOHEXOL 300 MG/ML  SOLN
100.0000 mL | Freq: Once | INTRAMUSCULAR | Status: AC | PRN
  Administered 2021-05-20: 100 mL via INTRAVENOUS

## 2021-05-20 MED ORDER — ACETAMINOPHEN 325 MG PO TABS
650.0000 mg | ORAL_TABLET | Freq: Four times a day (QID) | ORAL | Status: DC | PRN
Start: 2021-05-20 — End: 2021-05-22

## 2021-05-20 MED ORDER — IOHEXOL 350 MG/ML SOLN
100.0000 mL | Freq: Once | INTRAVENOUS | Status: AC | PRN
  Administered 2021-05-20: 100 mL via INTRAVENOUS

## 2021-05-20 MED ORDER — FENTANYL CITRATE PF 50 MCG/ML IJ SOSY: 50.0000 ug | PREFILLED_SYRINGE | INTRAMUSCULAR | Status: DC | PRN

## 2021-05-20 MED ORDER — ONDANSETRON HCL 4 MG/2ML IJ SOLN: 4.0000 mg | Freq: Four times a day (QID) | INTRAMUSCULAR | Status: DC | PRN

## 2021-05-20 MED ORDER — ONDANSETRON HCL 4 MG/2ML IJ SOLN
4.0000 mg | Freq: Once | INTRAMUSCULAR | Status: AC
  Administered 2021-05-20: 4 mg via INTRAVENOUS
  Filled 2021-05-20: qty 2

## 2021-05-20 MED ORDER — PIPERACILLIN-TAZOBACTAM 3.375 G IVPB 30 MIN
3.3750 g | Freq: Once | INTRAVENOUS | Status: AC
  Administered 2021-05-20: 3.375 g via INTRAVENOUS
  Filled 2021-05-20: qty 50

## 2021-05-20 MED ORDER — LORATADINE 10 MG PO TABS
10.0000 mg | ORAL_TABLET | Freq: Every day | ORAL | Status: DC
  Administered 2021-05-20 – 2021-05-22 (×3): 10 mg via ORAL
  Filled 2021-05-20 (×3): qty 1

## 2021-05-20 NOTE — Assessment & Plan Note (Addendum)
Continue Plavix 75 mg p.o. daily, Crestor 40 mg p.o. daily.  Follow-up with cardiology as needed. ?

## 2021-05-20 NOTE — Plan of Care (Signed)

## 2021-05-20 NOTE — Progress Notes (Signed)
Pharmacy Antibiotic Note ? ?Rodney Lloyd is a 51 y.o. male admitted on 05/20/2021 presenting with bloody diarrhea, concern for IAI.  Pharmacy has been consulted for zosyn dosing. ? ?Plan: ?Zosyn 3.375g IV q 8h (extended 4h infusion) ?Monitor renal function, GI workup and LOT ? ?Height: 5\' 11"  (180.3 cm) ?Weight: 115.2 kg (254 lb) ?IBW/kg (Calculated) : 75.3 ? ?Temp (24hrs), Avg:98.3 ?F (36.8 ?C), Min:98.3 ?F (36.8 ?C), Max:98.3 ?F (36.8 ?C) ? ?Recent Labs  ?Lab 05/20/21 ?0848  ?WBC 6.8  ?CREATININE 1.00  ?  ?Estimated Creatinine Clearance: 112.9 mL/min (by C-G formula based on SCr of 1 mg/dL).   ? ?No Active Allergies ? ?05/22/21, PharmD ?Clinical Pharmacist ?ED Pharmacist Phone # [phone] ?05/20/2021 1:36 PM ? ? ?

## 2021-05-20 NOTE — Assessment & Plan Note (Addendum)
Patient presenting with multiple bright red bloody bowel movements.  FOBT positive.  CT abdomen/pelvis notable for acute diverticulitis left colon.  Patient was started on IV Zosyn.  Gastroenterology was consulted and recommended 10-day course of antibiotics.  Patient's symptoms have resolved, tolerating advance diet. Will continue Augmentin 875-125 mg p.o. twice daily to complete 10-day course.  Will need outpatient follow-up with gastroenterology 6 weeks for colonoscopy following acute diverticulitis.  Hemoglobin stable, 11.6 at time of discharge. ? ?

## 2021-05-20 NOTE — ED Triage Notes (Signed)
[facility] with reports of bloody diarrhea onset this morning around 0300. Reports 8 episodes of bloody stool today. Pt also endorses abdominal swelling.  ?
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
## 2021-05-20 NOTE — ED Notes (Signed)
Pt with 4th bloody BM since arrival. Admitting MD made aware. Will go to CT stat.  ?
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
## 2021-05-20 NOTE — Assessment & Plan Note (Addendum)
Currently incarcerated, counseled on need for complete cessation. ?
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
## 2021-05-20 NOTE — Assessment & Plan Note (Addendum)
Patient on losartan 100 mg p.o. daily, chlorthalidone 25 mg p.o. daily and carvedilol 12.5 mg qAM and 18.75 mg qHS.  ?

## 2021-05-20 NOTE — Plan of Care (Signed)

## 2021-05-20 NOTE — Assessment & Plan Note (Addendum)
Continue crestor 40 mg p.o. daily ?

## 2021-05-21 DIAGNOSIS — H538 Other visual disturbances: Secondary | ICD-10-CM | POA: Diagnosis present

## 2021-05-21 DIAGNOSIS — H1133 Conjunctival hemorrhage, bilateral: Secondary | ICD-10-CM | POA: Diagnosis present

## 2021-05-21 DIAGNOSIS — Z79899 Other long term (current) drug therapy: Secondary | ICD-10-CM | POA: Diagnosis not present

## 2021-05-21 DIAGNOSIS — K5792 Diverticulitis of intestine, part unspecified, without perforation or abscess without bleeding: Secondary | ICD-10-CM | POA: Diagnosis present

## 2021-05-21 DIAGNOSIS — I2510 Atherosclerotic heart disease of native coronary artery without angina pectoris: Secondary | ICD-10-CM | POA: Diagnosis present

## 2021-05-21 DIAGNOSIS — S61512D Laceration without foreign body of left wrist, subsequent encounter: Secondary | ICD-10-CM

## 2021-05-21 DIAGNOSIS — F1410 Cocaine abuse, uncomplicated: Secondary | ICD-10-CM | POA: Diagnosis present

## 2021-05-21 DIAGNOSIS — Z8249 Family history of ischemic heart disease and other diseases of the circulatory system: Secondary | ICD-10-CM | POA: Diagnosis not present

## 2021-05-21 DIAGNOSIS — Z87891 Personal history of nicotine dependence: Secondary | ICD-10-CM | POA: Diagnosis not present

## 2021-05-21 DIAGNOSIS — Z8674 Personal history of sudden cardiac arrest: Secondary | ICD-10-CM | POA: Diagnosis not present

## 2021-05-21 DIAGNOSIS — S0592XD Unspecified injury of left eye and orbit, subsequent encounter: Secondary | ICD-10-CM | POA: Diagnosis not present

## 2021-05-21 DIAGNOSIS — E785 Hyperlipidemia, unspecified: Secondary | ICD-10-CM | POA: Diagnosis present

## 2021-05-21 DIAGNOSIS — Z800 Family history of malignant neoplasm of digestive organs: Secondary | ICD-10-CM | POA: Diagnosis not present

## 2021-05-21 DIAGNOSIS — R110 Nausea: Secondary | ICD-10-CM | POA: Diagnosis present

## 2021-05-21 DIAGNOSIS — I10 Essential (primary) hypertension: Secondary | ICD-10-CM | POA: Diagnosis present

## 2021-05-21 DIAGNOSIS — Z83438 Family history of other disorder of lipoprotein metabolism and other lipidemia: Secondary | ICD-10-CM | POA: Diagnosis not present

## 2021-05-21 DIAGNOSIS — Z7902 Long term (current) use of antithrombotics/antiplatelets: Secondary | ICD-10-CM | POA: Diagnosis not present

## 2021-05-21 DIAGNOSIS — Z20822 Contact with and (suspected) exposure to covid-19: Secondary | ICD-10-CM | POA: Diagnosis present

## 2021-05-21 DIAGNOSIS — K922 Gastrointestinal hemorrhage, unspecified: Principal | ICD-10-CM | POA: Diagnosis present

## 2021-05-21 DIAGNOSIS — K5733 Diverticulitis of large intestine without perforation or abscess with bleeding: Secondary | ICD-10-CM | POA: Diagnosis present

## 2021-05-21 DIAGNOSIS — I252 Old myocardial infarction: Secondary | ICD-10-CM | POA: Diagnosis not present

## 2021-05-21 DIAGNOSIS — S0591XD Unspecified injury of right eye and orbit, subsequent encounter: Secondary | ICD-10-CM | POA: Diagnosis not present

## 2021-05-21 DIAGNOSIS — S6992XA Unspecified injury of left wrist, hand and finger(s), initial encounter: Secondary | ICD-10-CM | POA: Diagnosis present

## 2021-05-21 DIAGNOSIS — I700 Atherosclerosis of aorta: Secondary | ICD-10-CM | POA: Diagnosis present

## 2021-05-21 DIAGNOSIS — Z955 Presence of coronary angioplasty implant and graft: Secondary | ICD-10-CM | POA: Diagnosis not present

## 2021-05-21 DIAGNOSIS — Z7151 Drug abuse counseling and surveillance of drug abuser: Secondary | ICD-10-CM | POA: Diagnosis not present

## 2021-05-21 LAB — CBC
HCT: 33.3 % — ABNORMAL LOW (ref 39.0–52.0)
Hemoglobin: 10.8 g/dL — ABNORMAL LOW (ref 13.0–17.0)
MCH: 28.7 pg (ref 26.0–34.0)
MCHC: 32.4 g/dL (ref 30.0–36.0)
MCV: 88.6 fL (ref 80.0–100.0)
Platelets: 155 10*3/uL (ref 150–400)
RBC: 3.76 MIL/uL — ABNORMAL LOW (ref 4.22–5.81)
RDW: 13 % (ref 11.5–15.5)
WBC: 5.5 10*3/uL (ref 4.0–10.5)
nRBC: 0 % (ref 0.0–0.2)

## 2021-05-21 LAB — BASIC METABOLIC PANEL
Anion gap: 7 (ref 5–15)
BUN: 5 mg/dL — ABNORMAL LOW (ref 6–20)
CO2: 25 mmol/L (ref 22–32)
Calcium: 8.3 mg/dL — ABNORMAL LOW (ref 8.9–10.3)
Chloride: 105 mmol/L (ref 98–111)
Creatinine, Ser: 1.05 mg/dL (ref 0.61–1.24)
GFR, Estimated: >60 mL/min (ref >60)
Glucose, Bld: 87 mg/dL (ref 70–99)
Potassium: 3.8 mmol/L (ref 3.5–5.1)
Sodium: 137 mmol/L (ref 135–145)

## 2021-05-21 MED ORDER — POLYVINYL ALCOHOL 1.4 % OP SOLN
2.0000 [drp] | Freq: Four times a day (QID) | OPHTHALMIC | Status: DC
  Administered 2021-05-21 – 2021-05-22 (×4): 2 [drp] via OPHTHALMIC
  Filled 2021-05-21: qty 15

## 2021-05-21 MED ORDER — ERYTHROMYCIN 5 MG/GM OP OINT
TOPICAL_OINTMENT | Freq: Four times a day (QID) | OPHTHALMIC | Status: DC
  Filled 2021-05-21 (×2): qty 3.5

## 2021-05-21 MED ORDER — CIPROFLOXACIN HCL 0.3 % OP SOLN
2.0000 [drp] | Freq: Four times a day (QID) | OPHTHALMIC | Status: DC
  Administered 2021-05-21 – 2021-05-22 (×4): 2 [drp] via OPHTHALMIC
  Filled 2021-05-21: qty 2.5
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
## 2021-05-21 NOTE — Assessment & Plan Note (Signed)
Patient with recent left wrist laceration that was approximated with 4 staples on 05/15/2021. ?--Will need staple removal 14 days from placement (05/29/2021) ?

## 2021-05-22 ENCOUNTER — Other Ambulatory Visit: Payer: Self-pay

## 2021-05-22 DIAGNOSIS — S6992XA Unspecified injury of left wrist, hand and finger(s), initial encounter: Secondary | ICD-10-CM | POA: Diagnosis present

## 2021-05-22 LAB — CBC
HCT: 34.6 % — ABNORMAL LOW (ref 39.0–52.0)
Hemoglobin: 11.6 g/dL — ABNORMAL LOW (ref 13.0–17.0)
MCH: 29 pg (ref 26.0–34.0)
MCHC: 33.5 g/dL (ref 30.0–36.0)
MCV: 86.5 fL (ref 80.0–100.0)
Platelets: 172 10*3/uL (ref 150–400)
RBC: 4 MIL/uL — ABNORMAL LOW (ref 4.22–5.81)
RDW: 12.5 % (ref 11.5–15.5)
WBC: 5.3 10*3/uL (ref 4.0–10.5)
nRBC: 0 % (ref 0.0–0.2)

## 2021-05-22 MED ORDER — ERYTHROMYCIN 5 MG/GM OP OINT: TOPICAL_OINTMENT | Freq: Four times a day (QID) | OPHTHALMIC | 0 refills | Status: AC

## 2021-05-22 MED ORDER — CIPROFLOXACIN HCL 0.3 % OP SOLN: 2.0000 [drp] | Freq: Four times a day (QID) | OPHTHALMIC | 0 refills | Status: AC

## 2021-05-22 MED ORDER — AMOXICILLIN-POT CLAVULANATE 875-125 MG PO TABS
1.0000 | ORAL_TABLET | Freq: Two times a day (BID) | ORAL | Status: DC
  Administered 2021-05-22: 1 via ORAL
  Filled 2021-05-22: qty 1

## 2021-05-22 MED ORDER — AMOXICILLIN-POT CLAVULANATE 875-125 MG PO TABS: 1.0000 | ORAL_TABLET | Freq: Two times a day (BID) | ORAL | 0 refills | Status: AC
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
## 2021-05-22 NOTE — Discharge Summary (Addendum)
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 360bbb-3(b)(1), unless the authorization is terminated  or revoked sooner.       Influenza A by PCR NEGATIVE NEGATIVE Final   Influenza B by PCR NEGATIVE NEGATIVE Final    Comment: (NOTE) The Xpert Xpress SARS-CoV-2/FLU/RSV plus assay is intended as an aid in the diagnosis of influenza from Nasopharyngeal swab specimens and should not be used as a sole basis for treatment. Nasal washings and aspirates are unacceptable for Xpert Xpress SARS-CoV-2/FLU/RSV testing.  Fact Sheet for Patients: BloggerCourse.com  Fact Sheet for Healthcare Providers: SeriousBroker.it  This test is not yet approved or cleared by the [place] FDA and has been authorized for detection and/or diagnosis of SARS-CoV-2 by FDA under an Emergency Use Authorization (EUA). This EUA will remain in effect (meaning this test can be used)  for the duration of the COVID-19 declaration under Section 564(b)(1) of the Act, 21 U.S.C. section 360bbb-3(b)(1), unless the authorization is terminated or revoked.  Performed at [facility], [address], [place], [place] [address]      Labs: BNP (last 3 results) No results for input(s): BNP in the last 8760 hours. Basic Metabolic Panel: Recent Labs  Lab 05/20/21 0848 05/21/21 0312  NA 141 137  K 3.8 3.8  CL 108 105  CO2 24 25  GLUCOSE 104* 87  BUN 6 5*  CREATININE 1.00 1.05  CALCIUM 9.0 8.3*   Liver Function Tests: Recent Labs  Lab 05/20/21 0848  AST 16  ALT 13  ALKPHOS 56  BILITOT 0.7  PROT 7.0  ALBUMIN 3.7   Recent Labs  Lab 05/20/21 0848  LIPASE 28   No results for input(s): AMMONIA in the last 168 hours. CBC: Recent Labs  Lab 05/20/21 0848 05/20/21 1330 05/20/21 1833 05/21/21 0312 05/22/21 0201  WBC 6.8 5.7 6.5 5.5 5.3  NEUTROABS 3.6  --   --   --   --   HGB 13.3 11.1* 11.8* 10.8* 11.6*  HCT 41.5 34.5* 36.0* 33.3* 34.6*  MCV 89.1 90.1 88.2 88.6 86.5  PLT 189 155 163 155 172   Cardiac Enzymes: No results for input(s): CKTOTAL, CKMB, CKMBINDEX, TROPONINI in the last 168 hours. BNP: Invalid input(s): POCBNP CBG: No results for input(s): GLUCAP in the last 168 hours. D-Dimer No results for input(s): DDIMER in the last 72 hours. Hgb A1c No results for input(s): HGBA1C in  the last 72 hours. Lipid Profile No results for input(s): CHOL, HDL, LDLCALC, TRIG, CHOLHDL, LDLDIRECT in the last 72 hours. Thyroid function studies No results for input(s): TSH, T4TOTAL, T3FREE, THYROIDAB in the last 72 hours.  Invalid input(s): FREET3 Anemia work up No results for input(s): VITAMINB12, FOLATE, FERRITIN, TIBC, IRON, RETICCTPCT in the last 72 hours. Urinalysis    Component Value Date/Time   COLORURINE YELLOW 05/20/2021 1030   APPEARANCEUR HAZY (A) 05/20/2021 1030   APPEARANCEUR Clear 09/15/2020 1235   LABSPEC 1.023 05/20/2021 1030    PHURINE 5.0 05/20/2021 1030   GLUCOSEU NEGATIVE 05/20/2021 1030   HGBUR SMALL (A) 05/20/2021 1030   BILIRUBINUR NEGATIVE 05/20/2021 1030   BILIRUBINUR Negative 09/15/2020 1235   KETONESUR NEGATIVE 05/20/2021 1030   PROTEINUR NEGATIVE 05/20/2021 1030   NITRITE NEGATIVE 05/20/2021 1030   LEUKOCYTESUR MODERATE (A) 05/20/2021 1030   Sepsis Labs Invalid input(s): PROCALCITONIN,  WBC,  LACTICIDVEN Microbiology Recent Results (from the past 240 hour(s))  Resp Panel by RT-PCR (Flu A&B, Covid) Nasopharyngeal Swab     Status: None   Collection Time: 05/20/21 11:35 AM   Specimen: Nasopharyngeal Swab; Nasopharyngeal(NP) swabs in vial transport medium  Result Value Ref Range Status   SARS Coronavirus 2 by RT PCR NEGATIVE NEGATIVE Final    Comment: (NOTE) SARS-CoV-2 target nucleic acids are NOT DETECTED.  The SARS-CoV-2 RNA is generally detectable in upper respiratory specimens during the acute phase of infection. The lowest concentration of SARS-CoV-2 viral copies this assay can detect is 138 copies/mL. A negative result does not preclude SARS-Cov-2 infection and should not be used as the sole basis for treatment or other patient management decisions. A negative result may occur with  improper specimen collection/handling, submission of specimen other than nasopharyngeal swab, presence of viral mutation(s) within the areas targeted by this assay, and inadequate number of viral copies(<138 copies/mL). A negative result must be combined with clinical observations, patient history, and epidemiological information. The expected result is Negative.  Fact Sheet for Patients:  BloggerCourse.com  Fact Sheet for Healthcare Providers:  SeriousBroker.it  This test is no t yet approved or cleared by the [place] FDA and  has been authorized for detection and/or diagnosis of SARS-CoV-2 by FDA under an Emergency Use Authorization (EUA). This EUA  will remain  in effect (meaning this test can be used) for the duration of the COVID-19 declaration under Section 564(b)(1) of the Act, 21 U.S.C.section 360bbb-3(b)(1), unless the authorization is terminated  or revoked sooner.       Influenza A by PCR NEGATIVE NEGATIVE Final   Influenza B by PCR NEGATIVE NEGATIVE Final    Comment: (NOTE) The Xpert Xpress SARS-CoV-2/FLU/RSV plus assay is intended as an aid in the diagnosis of influenza from Nasopharyngeal swab specimens and should not be used as a sole basis for treatment. Nasal washings and aspirates are unacceptable for Xpert Xpress SARS-CoV-2/FLU/RSV testing.  Fact Sheet for Patients: BloggerCourse.com  Fact Sheet for Healthcare Providers: SeriousBroker.it  This test is not yet approved or cleared by the [place] FDA and has been authorized for detection and/or diagnosis of SARS-CoV-2 by FDA under an Emergency Use Authorization (EUA). This EUA will remain in effect (meaning this test can be used) for the duration of the COVID-19 declaration under Section 564(b)(1) of the Act, 21 U.S.C. section 360bbb-3(b)(1), unless the authorization is terminated or revoked.  Performed at [facility], [address], [place], [place] [address]      Time coordinating discharge: Over  30 minutes  SIGNED:   Alvira Philips [place], DO  [facility] 05/22/2021, 12:29 PM
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
## 2021-05-22 NOTE — Plan of Care (Signed)

## 2021-05-24 LAB — GLUCOSE, CAPILLARY: Glucose-Capillary: 119 mg/dL — ABNORMAL HIGH (ref 70–99)

## 2021-05-28 ENCOUNTER — Telehealth: Payer: Self-pay | Admitting: *Deleted

## 2021-05-28 ENCOUNTER — Other Ambulatory Visit: Payer: Self-pay

## 2021-05-28 MED ORDER — AMOXICILLIN-POT CLAVULANATE 875-125 MG PO TABS
ORAL_TABLET | ORAL | 0 refills | Status: DC
  Filled 2021-05-28 (×2): qty 16, 8d supply, fill #0

## 2021-05-28 MED ORDER — ERYTHROMYCIN 5 MG/GM OP OINT
TOPICAL_OINTMENT | OPHTHALMIC | 0 refills | Status: DC
  Filled 2021-05-28: qty 3.5, 7d supply, fill #0

## 2021-05-28 MED ORDER — CIPROFLOXACIN HCL 0.3 % OP SOLN
OPHTHALMIC | 0 refills | Status: DC
  Filled 2021-05-28: qty 10, 7d supply, fill #0

## 2021-05-28 NOTE — Telephone Encounter (Signed)
Pt called regarding Rx [facility].  RNCM called in Rx to pharmacy of choice as written. ?

## 2021-05-29 ENCOUNTER — Other Ambulatory Visit: Payer: Self-pay

## 2021-05-31 ENCOUNTER — Other Ambulatory Visit: Payer: Self-pay

## 2021-06-05 ENCOUNTER — Other Ambulatory Visit: Payer: Self-pay

## 2021-06-19 ENCOUNTER — Other Ambulatory Visit: Payer: Self-pay

## 2021-06-20 ENCOUNTER — Other Ambulatory Visit: Payer: Self-pay

## 2021-08-01 ENCOUNTER — Other Ambulatory Visit: Payer: Self-pay

## 2021-08-01 ENCOUNTER — Other Ambulatory Visit: Payer: Self-pay | Admitting: Cardiology

## 2021-08-01 ENCOUNTER — Other Ambulatory Visit: Payer: Self-pay | Admitting: Urology

## 2021-08-02 ENCOUNTER — Other Ambulatory Visit: Payer: Self-pay

## 2021-08-02 MED ORDER — TADALAFIL (PAH) 20 MG PO TABS
ORAL_TABLET | ORAL | 9 refills | Status: DC
Start: 2021-08-02 — End: 2022-11-21
  Filled 2021-08-02 – 2021-09-17 (×3): qty 10, 30d supply, fill #0
  Filled 2021-11-01: qty 10, 30d supply, fill #1
  Filled 2021-12-19 – 2022-02-21 (×4): qty 10, 30d supply, fill #2
  Filled 2022-04-05 (×3): qty 10, 30d supply, fill #3
  Filled 2022-05-17: qty 10, 30d supply, fill #4
  Filled 2022-07-21 – 2022-07-22 (×2): qty 10, 30d supply, fill #5

## 2021-08-03 ENCOUNTER — Other Ambulatory Visit: Payer: Self-pay

## 2021-08-06 ENCOUNTER — Other Ambulatory Visit: Payer: Self-pay

## 2021-08-15 ENCOUNTER — Other Ambulatory Visit: Payer: Self-pay

## 2021-09-17 ENCOUNTER — Ambulatory Visit: Payer: No Typology Code available for payment source | Admitting: Urology

## 2021-09-17 ENCOUNTER — Other Ambulatory Visit: Payer: Self-pay

## 2021-09-18 ENCOUNTER — Ambulatory Visit: Payer: No Typology Code available for payment source | Admitting: Urology

## 2021-11-01 ENCOUNTER — Other Ambulatory Visit: Payer: Self-pay | Admitting: Cardiology

## 2021-11-01 ENCOUNTER — Other Ambulatory Visit: Payer: Self-pay

## 2021-11-01 MED ORDER — CARVEDILOL 12.5 MG PO TABS
ORAL_TABLET | ORAL | 0 refills | Status: DC
  Filled 2021-11-01: qty 75, 30d supply, fill #0
  Filled 2021-12-17: qty 75, 30d supply, fill #1
  Filled 2022-01-23: qty 75, 30d supply, fill #2

## 2021-11-01 MED ORDER — CLOPIDOGREL BISULFATE 75 MG PO TABS
ORAL_TABLET | Freq: Every day | ORAL | 0 refills | Status: DC
Start: 2021-11-01 — End: 2022-01-23
  Filled 2021-11-01: qty 30, 30d supply, fill #0
  Filled 2021-12-17: qty 30, 30d supply, fill #1
  Filled 2022-01-23: qty 30, 30d supply, fill #2

## 2021-12-17 ENCOUNTER — Other Ambulatory Visit: Payer: Self-pay

## 2021-12-19 ENCOUNTER — Other Ambulatory Visit: Payer: Self-pay

## 2021-12-26 ENCOUNTER — Other Ambulatory Visit: Payer: Self-pay

## 2022-01-23 ENCOUNTER — Other Ambulatory Visit: Payer: Self-pay | Admitting: Cardiology

## 2022-01-23 ENCOUNTER — Telehealth: Payer: Self-pay | Admitting: Cardiology

## 2022-01-23 ENCOUNTER — Other Ambulatory Visit: Payer: Self-pay

## 2022-01-23 DIAGNOSIS — E785 Hyperlipidemia, unspecified: Secondary | ICD-10-CM

## 2022-01-23 MED ORDER — CLOPIDOGREL BISULFATE 75 MG PO TABS
75.0000 mg | ORAL_TABLET | Freq: Every day | ORAL | 0 refills | Status: DC
  Filled 2022-01-23: qty 30, 30d supply, fill #0

## 2022-01-23 MED ORDER — CARVEDILOL 12.5 MG PO TABS
ORAL_TABLET | ORAL | 0 refills | Status: DC
  Filled 2022-01-23: qty 75, 30d supply, fill #0

## 2022-01-23 MED ORDER — CHLORTHALIDONE 25 MG PO TABS
25.0000 mg | ORAL_TABLET | Freq: Every day | ORAL | 0 refills | Status: DC
  Filled 2022-01-23: qty 30, 30d supply, fill #0

## 2022-01-23 MED ORDER — LOSARTAN POTASSIUM 100 MG PO TABS
100.0000 mg | ORAL_TABLET | Freq: Every day | ORAL | 0 refills | Status: DC
  Filled 2022-01-23: qty 30, 30d supply, fill #0

## 2022-01-23 MED ORDER — ROSUVASTATIN CALCIUM 40 MG PO TABS
40.0000 mg | ORAL_TABLET | Freq: Every day | ORAL | 0 refills | Status: DC
  Filled 2022-01-23: qty 30, 30d supply, fill #0

## 2022-01-23 NOTE — Telephone Encounter (Signed)
Pt's medications were sent to pt's pharmacy as requested. Confirmation received.  

## 2022-01-23 NOTE — Telephone Encounter (Signed)
*  STAT* If patient is at the pharmacy, call can be transferred to refill team.   1. Which medications need to be refilled? (please list name of each medication and dose if known) losartan (COZAAR) 100 MG tablet rosuvastatin (CRESTOR) 40 MG tablet (Expired) clopidogrel (PLAVIX) 75 MG tablet chlorthalidone (HYGROTON) 25 MG tablet (Expired) carvedilol (COREG) 12.5 MG tablet   2. Which pharmacy/location (including street and city if local pharmacy) is medication to be sent to? [facility]   3. Do they need a 30 day or 90 day supply? 30

## 2022-01-28 ENCOUNTER — Other Ambulatory Visit: Payer: Self-pay

## 2022-01-28 ENCOUNTER — Encounter: Payer: Self-pay | Admitting: Cardiology

## 2022-01-28 ENCOUNTER — Ambulatory Visit: Attending: Cardiology | Admitting: Cardiology

## 2022-01-28 VITALS — BP 110/72 | HR 70 | Ht 70.0 in | Wt 237.4 lb

## 2022-01-28 DIAGNOSIS — I2109 ST elevation (STEMI) myocardial infarction involving other coronary artery of anterior wall: Secondary | ICD-10-CM

## 2022-01-28 DIAGNOSIS — I462 Cardiac arrest due to underlying cardiac condition: Secondary | ICD-10-CM

## 2022-01-28 DIAGNOSIS — F1410 Cocaine abuse, uncomplicated: Secondary | ICD-10-CM

## 2022-01-28 DIAGNOSIS — Z955 Presence of coronary angioplasty implant and graft: Secondary | ICD-10-CM

## 2022-01-28 DIAGNOSIS — E785 Hyperlipidemia, unspecified: Secondary | ICD-10-CM

## 2022-01-28 DIAGNOSIS — I10 Essential (primary) hypertension: Secondary | ICD-10-CM

## 2022-01-28 DIAGNOSIS — K922 Gastrointestinal hemorrhage, unspecified: Secondary | ICD-10-CM

## 2022-01-28 DIAGNOSIS — I2510 Atherosclerotic heart disease of native coronary artery without angina pectoris: Secondary | ICD-10-CM

## 2022-01-28 MED ORDER — ROSUVASTATIN CALCIUM 40 MG PO TABS: 40.0000 mg | ORAL_TABLET | Freq: Every day | ORAL | 3 refills | Status: DC

## 2022-01-28 MED ORDER — CLOPIDOGREL BISULFATE 75 MG PO TABS: 75.0000 mg | ORAL_TABLET | Freq: Every day | ORAL | 3 refills | Status: DC

## 2022-01-28 MED ORDER — LOSARTAN POTASSIUM 25 MG PO TABS
25.0000 mg | ORAL_TABLET | Freq: Every day | ORAL | 3 refills | Status: DC
  Filled 2022-01-28: qty 30, 30d supply, fill #0
  Filled 2022-02-21 (×2): qty 30, 30d supply, fill #1
  Filled 2022-04-05: qty 90, 90d supply, fill #2
  Filled 2022-04-05: qty 30, 30d supply, fill #2
  Filled 2022-04-05: qty 90, 90d supply, fill #2
  Filled 2022-07-21: qty 90, 90d supply, fill #3

## 2022-01-28 MED ORDER — CARVEDILOL 12.5 MG PO TABS
12.5000 mg | ORAL_TABLET | Freq: Two times a day (BID) | ORAL | 3 refills | Status: DC
Start: 2022-01-28 — End: 2022-07-22
  Filled 2022-01-28: qty 60, 30d supply, fill #0
  Filled 2022-01-28: qty 180, 90d supply, fill #0
  Filled 2022-02-21 (×2): qty 60, 30d supply, fill #1
  Filled 2022-04-05: qty 60, 30d supply, fill #2
  Filled 2022-04-05 (×2): qty 180, 90d supply, fill #2
  Filled 2022-07-21: qty 180, 90d supply, fill #3

## 2022-01-28 NOTE — Patient Instructions (Signed)
Medication Instructions:  Restart taking Rosuvastatin 40 mg daily   Start taking Losaratn 25 mg daily at beditme    Continue taking Clopidogrel 75 mg one tablet daily       Decrease carvedilol 12.5 mg twice a day     *If you need a refill on your cardiac medications before your next appointment, please call your pharmacy*   Lab Work: Today  Lipid CMP  LPa  In 6 month fasting- May 2024 LIPID CMP  If you have labs (blood work) drawn today and your tests are completely normal, you will receive your results only by: MyChart Message (if you have MyChart) OR A paper copy in the mail If you have any lab test that is abnormal or we need to change your treatment, we will call you to review the results.   Testing/Procedures: Not needed   Follow-Up: At [facility], you and your health needs are our priority.  As part of our continuing mission to provide you with exceptional heart care, we have created designated Provider Care Teams.  These Care Teams include your primary Cardiologist (physician) and Advanced Practice Providers (APPs -  Physician Assistants and Nurse Practitioners) who all work together to provide you with the care you need, when you need it.     Your next appointment:   6 month(s)  The format for your next appointment:   In Person  Provider:   Bryan Lemma, MD    Other Instructions

## 2022-01-28 NOTE — Progress Notes (Signed)
Primary Care Provider: Bing Neighbors, FNP [facility] Cardiologist: Bryan Lemma, MD Electrophysiologist: None  Clinic Note: Chief Complaint  Patient presents with   Follow-up    2-year follow-up.  Reestablishing care.  Stable cardiac wise.   Coronary Artery Disease    Only has chest discomfort with stress.  Not with exertion.   ===================================  ASSESSMENT/PLAN   Problem List Items Addressed This Visit       Cardiology Problems   h/o Acute ST elevation myocardial infarction (STEMI) of anterolateral wall (HCC) (Chronic)    Significant LAD lesion bifurcation PCI.  MI complicated by VF arrest.  Despite all this EF remains preserved with no significant wall motion maladies.  He was troubled by PTSD for a while afterwards.  But this is improving.  Follow-up echo showed EF 55 to 60%.  Has been on stable dose of carvedilol and ARB but ARB was not restarted potentially after his discharge from incarceration.  We will refill losartan but reduce the dose to 25 mg.      Relevant Medications   carvedilol (COREG) 12.5 MG tablet   rosuvastatin (CRESTOR) 40 MG tablet   losartan (COZAAR) 25 MG tablet   Other Relevant Orders   EKG 12-Lead (Completed)   Lipid panel (Completed)   Comprehensive metabolic panel (Completed)   Lipoprotein A (LPA) (Completed)   Lipid panel   Comprehensive metabolic panel   Coronary artery disease involving native coronary artery without angina pectoris - Primary (Chronic)    In addition to his LAD that was treated with a PCI and PTCA of the diagonal branch, he had 65% ramus disease.  Treating that medically.  Diffuse moderate disease in the small RCA.  Plan Lifelong Plavix monotherapy => refill Plavix Okay to hold Plavix 5 to 7 days preop for surgeries or procedures. On rosuvastatin, but needs refill.  Will refill check lipids. Had been on ARB 100 mg but was not given prescription.  With BP being low, will prescribe 25 mg  losartan as opposed to 100 mg. Continue current dose of beta-blocker as described With no active anginal symptoms, would be okay for procedures or surgeries without further evaluation at this point.      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  Remarkably, his EF is back to normal 55 to 60% post PCI.  Carefully on carvedilol with history of cocaine use.  Continue current dose of carvedilol 12.5 mg in the morning and 18.75 mg in the evening.      Relevant Medications   carvedilol (COREG) 12.5 MG tablet   rosuvastatin (CRESTOR) 40 MG tablet   losartan (COZAAR) 25 MG tablet   Hyperlipidemia with target low density lipoprotein (LDL) cholesterol less than 70 mg/dL (Chronic)    Have not seen lipids in a while.  Needs to be back on a statin.  Lipids well-controlled back in 2021.  Hopefully if we get her back on a statin, lipids will be better controlled.   We can recheck labs now and reassess in 6 months. . For now continue  current dose of statin-provide refill.      Relevant Medications   carvedilol (COREG) 12.5 MG tablet   rosuvastatin (CRESTOR) 40 MG tablet   losartan (COZAAR) 25 MG tablet   Other Relevant Orders   EKG 12-Lead (Completed)   Lipid panel (Completed)   Comprehensive metabolic panel (Completed)   Lipoprotein A (LPA) (Completed)   Lipid panel   Comprehensive metabolic panel   Essential hypertension (Chronic)    BP little low.  No longer on chlorthalidone.  Will restart low-dose losartan 25 mg and continue current dose of carvedilol.      Relevant Medications    carvedilol (COREG) 12.5 MG tablet   rosuvastatin (CRESTOR) 40 MG tablet   losartan (COZAAR) 25 MG tablet   Other Relevant Orders   EKG 12-Lead (Completed)   Comprehensive metabolic panel (Completed)   Comprehensive metabolic panel     Other   Presence of drug coated stent in LAD coronary artery (Chronic)    On lifelong Plavix.  Okay to hold Plavix 5 to 7 days preop for surgical procedures.      Relevant Orders   EKG 12-Lead (Completed)   Lipid panel (Completed)   Comprehensive metabolic panel (Completed)   Lipoprotein A (LPA) (Completed)   Lipid panel   Comprehensive metabolic panel   Lower GI bleed   Cocaine abuse (HCC) (Chronic)    Counseled against use.  He says he has been free of drugs for several months.  I explained to him the concern with being on beta-blocker and with known CAD that the use of cocaine can be life-threatening.       ===================================  HPI:    Rodney Lloyd is a 51 y.o. male with a PMH notable for CAD-PCI LAD-D1 (Ant STEMI), HTN. HLD, h/o Cocaine use & h/o GI Bleed who is being seen today to Reestablish Cardiology Care at the request of Scot Jun, FNP.  CAD: Anterior STEMI in November 2019 (VF arrest with bystander CPR-3 shocks by EMS Prox to mid LAD PCI w/ PTCA of D1.  RI of proximal 65%.  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  (Exercised 7 minutes, 8.6 METS.  Peak HR 151 bpm was 88% MPHR.  Normal BP and HR response.  No ischemic changes.  No chest pain.-Having had to stop walking because of knee pain)  Recent Hospitalizations:  05/15/2021: Seen in the ER-brought in from [place].  Someone tried to "go outside".  Treated with erythromycin eyedrops.  3/19-21/2023: Brought to [facility] ER from [place] w/ c/o L-sided abdominal pain with large amounts of bloody diarrhea.  Initially noted at 3 AM-8 BM since then.   LLQ abdomen pain.  Radiates to the mid abdomen. CT abdomen pelvis noted left colonic diverticulosis/diverticulitis..  No active bleeding on CTA Rx IV ABx converted to PO Augmentin => OP GI f/u.   Stable BP & CAD. => Continued on Plavix, Crestor, losartan 100 mg daily, chlorthalidone 25 mg daily and carvedilol 12.5 mg in the a.m. and 18.75 mg in the PM.  Reviewed  CV studies:    The following studies were reviewed today: (if available, images/films reviewed: From Epic Chart or Care Everywhere) none:  Interval History:   Rodney Lloyd returns here today for almost a year follow-up.  He has been pretty stable from a cardiac standpoint.  This is the only time he has some chest discomfort as if he is under a lot of stress.  He was incarcerated for a long time and is somewhat deconditioned.  He gets a little short of breath with exertion simply because he has not been doing much.  He really does not notice any exertional chest discomfort.  He has been limited by the right knee pain that is been constant for him.  Denies any PND, orthopnea with trivial swelling.  No palpitations or tachycardia.  No dizziness.  No further GI bleeding. He wakes up with a headache every now and then but does feel he has restorative sleep.  He has not been on his losartan but has had the carvedilol filled.  Needs refill of his rosuvastatin.  He also said he ran out of aspirin but is still taking Plavix.  CV Review of Symptoms (Summary): positive for - chest pain, dyspnea on exertion, and which is usually associated with stress or anxiety.  Not necessarily with activity. negative for - edema, irregular heartbeat, orthopnea, palpitations, paroxysmal nocturnal dyspnea, rapid heart rate, shortness of breath, or syncope or near CP, TIA/amaurosis fugax or claudication.  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  Therefore he does not need his routine stress  testing.  REVIEWED OF SYSTEMS   Review of Systems  Constitutional:  Positive for weight loss. Negative for malaise/fatigue (Not really fatigue, just deconditioned.).  HENT:  Negative for congestion and nosebleeds.   Respiratory:  Negative for cough and shortness of breath.   Gastrointestinal:  Negative for blood in stool, constipation and melena.       No further issues with  Genitourinary:  Negative for hematuria.  Musculoskeletal:  Positive for joint pain (R knee pain - chronic -limits activity.).  Neurological:  Positive for headaches. Negative for dizziness and weakness.  Endo/Heme/Allergies:  Does not bruise/bleed easily.  Psychiatric/Behavioral:  Negative for depression and memory loss. The patient is nervous/anxious. The patient does not have insomnia (Just wakes up with a headache).     I have reviewed and (if needed) personally updated the patient's problem list, medications, allergies, past medical and surgical history, social and family history.   PAST MEDICAL HISTORY   Past Medical History:  Diagnosis Date   Acute ST elevation myocardial infarction (STEMI) involving left anterior descending (LAD) coronary artery (HCC) 01/04/2019   Post VF Arrest (cocaine +) --> 100% p-mLAD after D1 (pLAD 60% & Ost Diag 75%) --> DES PCI to LAD & PTCA of ~small D1.  EF back up to 55-60% by Echo on d/c - distal anterior HK.   Alcohol abuse    CAD (coronary artery disease), native coronary artery    s/p DES PCI to LAD 01/2018 (Synergy DES 3 x 28 -- 3.6) acoss D1 with PTCA of jailed  D1.    Cocaine abuse (HCC)    HLD (hyperlipidemia)    Hypertension    Tobacco abuse     PAST SURGICAL HISTORY   Past Surgical History:  Procedure Laterality Date   CORONARY/GRAFT ACUTE MI REVASCULARIZATION N/A 01/03/2018   Procedure: Coronary/Graft Acute MI Revascularization- CORONARY STENT INTERVENTION;  Surgeon: Marykay Lex, MD;  Location: [facility]; DES PCI pLAD: Synergy DES 3 x 28 (3.6) &  PTCA of OstD1 (jailed)    RIGHT/LEFT HEART CATH AND CORONARY ANGIOGRAPHY N/A 01/03/2018   Procedure: RIGHT/LEFT HEART CATH AND CORONARY ANGIOGRAPHY;  Surgeon:  Leonie Man, MD;  Location: [facility];  Service: Cardiovascular;;  (VF -> Ant STEMI): 100% mLAD, 80% 1st Diag & 65%ost RI --> DES PCI p-mLAD & PTCA of D1.  EF~45% w/ anterior anterolateral HK.  Minimally Elevated LVEDP & PCWP & normal RHC pressures   TRANSTHORACIC ECHOCARDIOGRAM  01/04/2018   (following Ant STEMI - VF arrest):  EF 55-60%. Mild HK of apical anterior wall c/w LAD ischemia. Gr 1 DD. Normal valves & chamber sizes.      Post PCI 01/2018: Synergy DES 3.0 x 28 mm   Immunization History  Administered Date(s) Administered   Influenza,inj,Quad PF,6+ Mos 01/13/2018, 02/01/2019   Pneumococcal Polysaccharide-23 01/13/2018   Tdap 02/01/2019, 05/15/2021    MEDICATIONS/ALLERGIES   Current Meds  Medication Sig   AMBULATORY NON FORMULARY MEDICATION 0.2 mLs by Intracavernosal route as needed. Medication Name: Trimix  PGE 28mcg Pap 30mg  Phent 1mg    ciprofloxacin (CILOXAN) 0.3 % ophthalmic solution instill 2 drops into both eyes four times a day x 7 days   clopidogrel (PLAVIX) 75 MG tablet Take 1 tablet (75 mg total) by mouth daily.   clopidogrel (PLAVIX) 75 MG tablet Take 1 tablet (75 mg total) by mouth daily.   losartan (COZAAR) 25 MG tablet Take 1 tablet (25 mg total) by mouth at bedtime.   nitroGLYCERIN (NITROSTAT) 0.4 MG SL tablet Place 1 tablet (0.4 mg total) under the tongue every 5 (five) minutes as needed for chest pain.   rosuvastatin (CRESTOR) 40 MG tablet Take 1 tablet (40 mg total) by mouth daily.   senna (SENOKOT) 8.6 MG tablet Take 2 tablets by mouth at bedtime.   tadalafil, PAH, (ADCIRCA) 20 MG tablet TAKE 1 TABLET BY MOUTH 1/2 TO 1 HOUR PRIOR TO INTERCOURSE AS NEEDED. DO NOT USE NITROGLYCERIN 24 HRS PRIOR TO USE OR 48 HRS AFTER USE   [DISCONTINUED] amoxicillin-clavulanate (AUGMENTIN) 875-125 MG tablet  take 1 tablet by mouth every 12hrs x 8 days   [DISCONTINUED] carvedilol (COREG) 12.5 MG tablet Take 1 tablet (12.5 mg total) by mouth in the morning AND 1.5 tablets (18.75 mg total) at bedtime.   [DISCONTINUED] cetirizine (ZYRTEC) 10 MG tablet Take 10 mg by mouth daily. X 30 days   [DISCONTINUED] chlorthalidone (HYGROTON) 25 MG tablet Take 1 tablet (25 mg total) by mouth daily.   [DISCONTINUED] dibucaine (NUPERCAINAL) 1 % OINT Place 1 application. rectally 2 (two) times daily. X 21 days   [DISCONTINUED] erythromycin ophthalmic ointment apply a pea sized amount onto both eyes daily for x 7 days   [DISCONTINUED] losartan (COZAAR) 100 MG tablet Take 1 tablet (100 mg total) by mouth daily.   [DISCONTINUED] polyvinyl alcohol (LIQUIFILM TEARS) 1.4 % ophthalmic solution Place 2 drops into both eyes in the morning, at noon, and at bedtime. X 30 days    No Active Allergies  SOCIAL HISTORY/FAMILY HISTORY   Reviewed in Epic:   Social History   Tobacco Use   Smoking status: Former    Packs/day: 0.10    Types: Cigarettes    Quit date: 08/25/2018    Years since quitting: 3.4   Smokeless tobacco: Never  Vaping Use   Vaping Use: Never used  Substance Use Topics   Alcohol use: Not Currently   Drug use: Not Currently   Social History   Social History Narrative   Not on file   Family History  Problem Relation Age of Onset   Hyperlipidemia Mother    Heart disease Father  OBJCTIVE -PE, EKG, labs   Wt Readings from Last 3 Encounters:  01/28/22 237 lb 6.4 oz (107.7 kg)  05/20/21 254 lb (115.2 kg)  05/15/21 255 lb (115.7 kg)    Physical Exam: BP 110/72   Pulse 70   Ht 5\' 10"  (1.778 m)   Wt 237 lb 6.4 oz (107.7 kg)   SpO2 97%   BMI 34.06 kg/m  Physical Exam Vitals reviewed.  Constitutional:      General: He is not in acute distress.    Appearance: Normal appearance. He is obese. He is not ill-appearing or toxic-appearing.  HENT:     Head: Normocephalic and atraumatic.  Neck:      Vascular: No carotid bruit or JVD.  Cardiovascular:     Rate and Rhythm: Normal rate and regular rhythm. No extrasystoles are present.    Chest Wall: PMI is not displaced.     Pulses: Normal pulses.     Heart sounds: S1 normal and S2 normal. No murmur heard.    No friction rub. No gallop.  Pulmonary:     Effort: Pulmonary effort is normal. No respiratory distress.     Breath sounds: Normal breath sounds. No wheezing, rhonchi or rales.  Chest:     Chest wall: No tenderness.  Musculoskeletal:        General: No swelling. Normal range of motion.     Cervical back: Normal range of motion and neck supple.  Skin:    General: Skin is warm and dry.  Neurological:     General: No focal deficit present.     Mental Status: He is alert and oriented to person, place, and time.     Gait: Gait abnormal.  Psychiatric:        Mood and Affect: Mood normal.        Behavior: Behavior normal.        Thought Content: Thought content normal.        Judgment: Judgment normal.     Adult ECG Report  Rate: 70 ;  Rhythm: normal sinus rhythm and normal axis, intervals, durations. ;  Normal voltage  Narrative Interpretation: Normal  Recent Labs: Reviewed.  Has not had lipid labs since 2021. Lab Results  Component Value Date   CHOL 118 06/09/2019   HDL 32 (L) 06/09/2019   LDLCALC 55 06/09/2019   TRIG 183 (H) 06/09/2019   CHOLHDL 3.7 06/09/2019   Lab Results  Component Value Date   CREATININE 1.05 05/21/2021   BUN 5 (L) 05/21/2021   NA 137 05/21/2021   K 3.8 05/21/2021   CL 105 05/21/2021   CO2 25 05/21/2021      Latest Ref Rng & Units 05/22/2021    2:01 AM 05/21/2021    3:12 AM 05/20/2021    6:33 PM  CBC  WBC 4.0 - 10.5 K/uL 5.3  5.5  6.5   Hemoglobin 13.0 - 17.0 g/dL 11.6  10.8  11.8   Hematocrit 39.0 - 52.0 % 34.6  33.3  36.0   Platelets 150 - 400 K/uL 172  155  163     Lab Results  Component Value Date   HGBA1C 5.7 (H) 01/06/2018   Lab Results  Component Value Date   TSH  1.410 04/21/2018    ================================================== I spent a total of 17 minutes with the patient spent in direct patient consultation.  Additional time spent with chart review  / charting (studies, outside notes, etc): 18 min Total Time: 35 min  Current  medicines are reviewed at length with the patient today.  (+/- concerns) n/a  Notice: This dictation was prepared with Dragon dictation along with smart phrase technology. Any transcriptional errors that result from this process are unintentional and may not be corrected upon review.   Studies Ordered:  Orders Placed This Encounter  Procedures   Lipid panel   Comprehensive metabolic panel   Lipoprotein A (LPA)   Lipid panel   Comprehensive metabolic panel   EKG [id number]   Meds ordered this encounter  Medications   carvedilol (COREG) 12.5 MG tablet    Sig: Take 1 tablet (12.5 mg total) by mouth 2 (two) times daily with a meal.    Dispense:  180 tablet    Refill:  3   clopidogrel (PLAVIX) 75 MG tablet    Sig: Take 1 tablet (75 mg total) by mouth daily.    Dispense:  90 tablet    Refill:  3   rosuvastatin (CRESTOR) 40 MG tablet    Sig: Take 1 tablet (40 mg total) by mouth daily.    Dispense:  90 tablet    Refill:  3   losartan (COZAAR) 25 MG tablet    Sig: Take 1 tablet (25 mg total) by mouth at bedtime.    Dispense:  90 tablet    Refill:  3    Patient Instructions / Medication Changes & Studies & Tests Ordered   Patient Instructions  Medication Instructions:  Restart taking Rosuvastatin 40 mg daily   Start taking Losaratn 25 mg daily at beditme    Continue taking Clopidogrel 75 mg one tablet daily       Decrease carvedilol 12.5 mg twice a day     *If you need a refill on your cardiac medications before your next appointment, please call your pharmacy*   Lab Work: Today  Lipid CMP  LPa  In 6 month fasting- May 2024 LIPID CMP  If you have labs (blood work) drawn today and your  tests are completely normal, you will receive your results only by: MyChart Message (if you have MyChart) OR A paper copy in the mail If you have any lab test that is abnormal or we need to change your treatment, we will call you to review the results.   Testing/Procedures: Not needed   Follow-Up: At [facility], you and your health needs are our priority.  As part of our continuing mission to provide you with exceptional heart care, we have created designated Provider Care Teams.  These Care Teams include your primary Cardiologist (physician) and Advanced Practice Providers (APPs -  Physician Assistants and Nurse Practitioners) who all work together to provide you with the care you need, when you need it.     Your next appointment:   6 month(s)  The format for your next appointment:   In Person  Provider:   Glenetta Hew, MD    Other Instructions     Leonie Man, MD, MS Glenetta Hew, M.D., M.S. Interventional Cardiologist  [place]  Pager # [phone] Phone # [phone] [address] [place], [place] [address]   Thank you for choosing [facility]!!

## 2022-01-29 LAB — LIPID PANEL
Chol/HDL Ratio: 5.3 ratio — ABNORMAL HIGH (ref 0.0–5.0)
Cholesterol, Total: 218 mg/dL — ABNORMAL HIGH (ref 100–199)
HDL: 41 mg/dL (ref >39)
LDL Chol Calc (NIH): 152 mg/dL — ABNORMAL HIGH (ref 0–99)
Triglycerides: 138 mg/dL (ref 0–149)
VLDL Cholesterol Cal: 25 mg/dL (ref 5–40)

## 2022-01-29 LAB — COMPREHENSIVE METABOLIC PANEL
ALT: 14 IU/L (ref 0–44)
AST: 17 IU/L (ref 0–40)
Albumin/Globulin Ratio: 1.5 (ref 1.2–2.2)
Albumin: 4.6 g/dL (ref 3.8–4.9)
Alkaline Phosphatase: 81 IU/L (ref 44–121)
BUN/Creatinine Ratio: 10 (ref 9–20)
BUN: 9 mg/dL (ref 6–24)
Bilirubin Total: 0.6 mg/dL (ref 0.0–1.2)
CO2: 20 mmol/L (ref 20–29)
Calcium: 9.4 mg/dL (ref 8.7–10.2)
Chloride: 101 mmol/L (ref 96–106)
Creatinine, Ser: 0.92 mg/dL (ref 0.76–1.27)
Globulin, Total: 3 g/dL (ref 1.5–4.5)
Glucose: 88 mg/dL (ref 70–99)
Potassium: 3.9 mmol/L (ref 3.5–5.2)
Sodium: 140 mmol/L (ref 134–144)
Total Protein: 7.6 g/dL (ref 6.0–8.5)
eGFR: 101 mL/min/{1.73_m2} (ref >59)

## 2022-01-29 LAB — LIPOPROTEIN A (LPA): Lipoprotein (a): 34.2 nmol/L (ref <75.0)

## 2022-01-30 ENCOUNTER — Other Ambulatory Visit: Payer: Self-pay

## 2022-02-15 ENCOUNTER — Other Ambulatory Visit: Payer: Self-pay | Admitting: *Deleted

## 2022-02-15 DIAGNOSIS — E785 Hyperlipidemia, unspecified: Secondary | ICD-10-CM

## 2022-02-15 DIAGNOSIS — I2510 Atherosclerotic heart disease of native coronary artery without angina pectoris: Secondary | ICD-10-CM

## 2022-02-17 ENCOUNTER — Encounter: Payer: Self-pay | Admitting: Cardiology

## 2022-02-17 NOTE — Assessment & Plan Note (Addendum)
Have not seen lipids in a while.  Needs to be back on a statin.  Lipids well-controlled back in 2021.  Hopefully if we get her back on a statin, lipids will be better controlled.   We can recheck labs now and reassess in 6 months. . For now continue current dose of statin-provide refill.

## 2022-02-17 NOTE — Assessment & Plan Note (Signed)
Counseled against use.  He says he has been free of drugs for several months.  I explained to him the concern with being on beta-blocker and with known CAD that the use of cocaine can be life-threatening.

## 2022-02-17 NOTE — Assessment & Plan Note (Signed)
In addition to his LAD that was treated with a PCI and PTCA of the diagonal branch, he had 65% ramus disease.  Treating that medically.  Diffuse moderate disease in the small RCA.  Plan Lifelong Plavix monotherapy => refill Plavix Okay to hold Plavix 5 to 7 days preop for surgeries or procedures. On rosuvastatin, but needs refill.  Will refill check lipids. Had been on ARB 100 mg but was not given prescription.  With BP being low, will prescribe 25 mg losartan as opposed to 100 mg. Continue current dose of beta-blocker as described With no active anginal symptoms, would be okay for procedures or surgeries without further evaluation at this point.

## 2022-02-17 NOTE — Assessment & Plan Note (Signed)
Significant LAD lesion bifurcation PCI.  MI complicated by VF arrest.  Despite all this EF remains preserved with no significant wall motion maladies.  He was troubled by PTSD for a while afterwards.  But this is improving.  Follow-up echo showed EF 55 to 60%.  Has been on stable dose of carvedilol and ARB but ARB was not restarted potentially after his discharge from incarceration.  We will refill losartan but reduce the dose to 25 mg.

## 2022-02-17 NOTE — Assessment & Plan Note (Signed)
On lifelong Plavix.  Okay to hold Plavix 5 to 7 days preop for surgical procedures.

## 2022-02-17 NOTE — Assessment & Plan Note (Signed)
BP little low.  No longer on chlorthalidone.  Will restart low-dose losartan 25 mg and continue current dose of carvedilol.

## 2022-02-17 NOTE — Assessment & Plan Note (Signed)
He had VF in the setting of anterior MI.  Remarkably, his EF is back to normal 55 to 60% post PCI.  Carefully on carvedilol with history of cocaine use.  Continue current dose of carvedilol 12.5 mg in the morning and 18.75 mg in the evening.

## 2022-02-21 ENCOUNTER — Other Ambulatory Visit: Payer: Self-pay | Admitting: Cardiology

## 2022-02-21 ENCOUNTER — Other Ambulatory Visit: Payer: Self-pay

## 2022-02-21 DIAGNOSIS — E785 Hyperlipidemia, unspecified: Secondary | ICD-10-CM

## 2022-02-26 ENCOUNTER — Other Ambulatory Visit: Payer: Self-pay

## 2022-02-26 MED ORDER — ROSUVASTATIN CALCIUM 40 MG PO TABS
40.0000 mg | ORAL_TABLET | Freq: Every day | ORAL | 11 refills | Status: DC
  Filled 2022-02-26: qty 30, 30d supply, fill #0
  Filled 2022-04-05: qty 90, 90d supply, fill #1
  Filled 2022-04-05 (×2): qty 30, 30d supply, fill #1
  Filled 2022-05-17: qty 30, 30d supply, fill #2
  Filled 2022-07-17: qty 30, 30d supply, fill #3

## 2022-02-26 MED ORDER — CLOPIDOGREL BISULFATE 75 MG PO TABS
75.0000 mg | ORAL_TABLET | Freq: Every day | ORAL | 11 refills | Status: DC
  Filled 2022-02-26: qty 30, 30d supply, fill #0
  Filled 2022-04-05: qty 90, 90d supply, fill #1
  Filled 2022-04-05 (×2): qty 30, 30d supply, fill #1
  Filled 2022-05-17: qty 30, 30d supply, fill #2
  Filled 2022-07-21: qty 30, 30d supply, fill #3

## 2022-04-05 ENCOUNTER — Other Ambulatory Visit: Payer: Self-pay

## 2022-05-17 ENCOUNTER — Other Ambulatory Visit: Payer: Self-pay

## 2022-07-17 ENCOUNTER — Telehealth: Payer: Self-pay | Admitting: Cardiology

## 2022-07-17 ENCOUNTER — Other Ambulatory Visit: Payer: Self-pay

## 2022-07-17 NOTE — Telephone Encounter (Signed)
Called the patient's pharmacy due to seeing that an active order for Rosuvastatin 40 mg daily. Pharmacist stated that the patient has 2 active refill orders with refills and they will prepare it for patient to pick up.

## 2022-07-17 NOTE — Telephone Encounter (Signed)
*  STAT* If patient is at the pharmacy, call can be transferred to refill team.   1. Which medications need to be refilled? (please list name of each medication and dose if known)  rosuvastatin (CRESTOR) 40 MG tablet   2. Which pharmacy/location (including street and city if local pharmacy) is medication to be sent to? [facility]   3. Do they need a 30 day or 90 day supply? 90 day  Patient wants a 90 day supply sent over to the pharmacy.

## 2022-07-17 NOTE — Telephone Encounter (Addendum)
Called and left a voice message for patient stating that per the pharmacist that he has 2 active refill orders for the Rosuvastatin (Crestor) 40 mg daily that he can go to his pharmacy at [facility] and pick up when he needs a refill.

## 2022-07-22 ENCOUNTER — Encounter: Payer: Self-pay | Admitting: Cardiology

## 2022-07-22 ENCOUNTER — Ambulatory Visit: Attending: Cardiology | Admitting: Cardiology

## 2022-07-22 ENCOUNTER — Other Ambulatory Visit: Payer: Self-pay

## 2022-07-22 VITALS — BP 114/78 | HR 71 | Ht 70.0 in | Wt 238.6 lb

## 2022-07-22 DIAGNOSIS — I2510 Atherosclerotic heart disease of native coronary artery without angina pectoris: Secondary | ICD-10-CM

## 2022-07-22 DIAGNOSIS — E785 Hyperlipidemia, unspecified: Secondary | ICD-10-CM

## 2022-07-22 DIAGNOSIS — N5201 Erectile dysfunction due to arterial insufficiency: Secondary | ICD-10-CM

## 2022-07-22 DIAGNOSIS — I2109 ST elevation (STEMI) myocardial infarction involving other coronary artery of anterior wall: Secondary | ICD-10-CM

## 2022-07-22 DIAGNOSIS — Z955 Presence of coronary angioplasty implant and graft: Secondary | ICD-10-CM

## 2022-07-22 DIAGNOSIS — F1410 Cocaine abuse, uncomplicated: Secondary | ICD-10-CM

## 2022-07-22 DIAGNOSIS — I10 Essential (primary) hypertension: Secondary | ICD-10-CM

## 2022-07-22 MED ORDER — ROSUVASTATIN CALCIUM 40 MG PO TABS
40.0000 mg | ORAL_TABLET | Freq: Every day | ORAL | 3 refills | Status: DC
Start: 2022-07-22 — End: 2023-09-10
  Filled 2022-07-22: qty 90, 90d supply, fill #0
  Filled 2022-11-21: qty 90, 90d supply, fill #1
  Filled 2023-02-11: qty 90, 90d supply, fill #2
  Filled 2023-06-02: qty 90, 90d supply, fill #3

## 2022-07-22 MED ORDER — LOSARTAN POTASSIUM 25 MG PO TABS
25.0000 mg | ORAL_TABLET | Freq: Every day | ORAL | 3 refills | Status: DC
Start: 2022-07-22 — End: 2023-09-10
  Filled 2022-07-22: qty 90, 90d supply, fill #0
  Filled 2022-11-21: qty 90, 90d supply, fill #1
  Filled 2023-02-11: qty 90, 90d supply, fill #2
  Filled 2023-06-02: qty 90, 90d supply, fill #3

## 2022-07-22 MED ORDER — NITROGLYCERIN 0.4 MG SL SUBL
0.4000 mg | SUBLINGUAL_TABLET | SUBLINGUAL | 5 refills | Status: AC | PRN
  Filled 2022-07-22: qty 25, 15d supply, fill #0

## 2022-07-22 MED ORDER — CARVEDILOL 12.5 MG PO TABS
12.5000 mg | ORAL_TABLET | Freq: Two times a day (BID) | ORAL | 3 refills | Status: DC
  Filled 2022-07-22: qty 180, 90d supply, fill #0
  Filled 2022-11-21: qty 180, 90d supply, fill #1
  Filled 2023-02-11: qty 180, 90d supply, fill #2
  Filled 2023-06-02: qty 180, 90d supply, fill #3

## 2022-07-22 MED ORDER — CLOPIDOGREL BISULFATE 75 MG PO TABS
75.0000 mg | ORAL_TABLET | Freq: Every day | ORAL | 3 refills | Status: DC
  Filled 2022-07-22: qty 90, 90d supply, fill #0
  Filled 2022-11-21: qty 90, 90d supply, fill #1
  Filled 2023-02-11: qty 90, 90d supply, fill #2
  Filled 2023-06-02: qty 90, 90d supply, fill #3

## 2022-07-22 NOTE — Patient Instructions (Addendum)
Medication Instructions:   No changes  Medication refilled *If you need a refill on your cardiac medications before your next appointment, please call your pharmacy*   Lab Work: Not needed    Testing/Procedures:  Not needed  Follow-Up: At [facility], you and your health needs are our priority.  As part of our continuing mission to provide you with exceptional heart care, we have created designated Provider Care Teams.  These Care Teams include your primary Cardiologist (physician) and Advanced Practice Providers (APPs -  Physician Assistants and Nurse Practitioners) who all work together to provide you with the care you need, when you need it.     Your next appointment:   7 month(s)  The format for your next appointment:   In Person  Provider:

## 2022-07-22 NOTE — Progress Notes (Unsigned)
Primary Care Provider: Bing Neighbors, NP [facility] Cardiologist: Bryan Lemma, MD Electrophysiologist: None  Clinic Note: No chief complaint on file.  ===================================  ASSESSMENT/PLAN   Problem List Items Addressed This Visit       Cardiology Problems   Hyperlipidemia with target low density lipoprotein (LDL) cholesterol less than 70 mg/dL (Chronic)   h/o Acute ST elevation myocardial infarction (STEMI) of anterolateral wall (HCC) (Chronic)   Essential hypertension (Chronic)   Coronary artery disease involving native coronary artery without angina pectoris - Primary (Chronic)     Other   Presence of drug coated stent in LAD coronary artery (Chronic)    ===================================  HPI:    Rodney Lloyd is a 52 y.o. male with a PMH notable for CAD (Anterolateral STEMI-LAD PCI-01/2018-with plans for lifelong Plavix monotherapy), HTN, HLD, and history of GI bleed who presents today for 50-month follow-up at the request of Bing Neighbors, NP.  CAD: Anterior STEMI in November 2019 (VF arrest with bystander CPR-3 shocks by EMS Prox to mid LAD PCI w/ PTCA of D1.  RI of proximal 65%.  EF was 45 to 50%  Rodney Lloyd was last seen on January 28, 2022 as a year follow-up.  Stable for cardiac standpoint. At time noted chest discomfort was over he was under stress.  Long-term incarceration.  Somewhat deconditioned.  Little short of breath with modest activity.  No exertional chest pain.  Exercise more limited by right knee pain.  Occasional headache.  Had not been on his losartan because of no refills.  Was on carvedilol.  Needs refill for rosuvastatin.  Ran out of aspirin but was still taking Plavix thankfully.  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  Recent Hospitalizations: ***  Reviewed  CV studies:    The following studies were reviewed today: (if available, images/films  reviewed: From Epic Chart or Care Everywhere) ***:  Interval History:   Rodney Lloyd   CV Review of Symptoms (Summary): {roscv:[id number]}  REVIEWED OF SYSTEMS   ROS  Right knee pain.  Headaches come.  Anxiety  I have reviewed and (if needed) personally updated the patient's problem list, medications, allergies, past medical and surgical history, social and family history.   PAST MEDICAL HISTORY   Past Medical History:  Diagnosis Date   Acute ST elevation myocardial infarction (STEMI) involving left anterior descending (LAD) coronary artery (HCC) 01/04/2019   Post VF Arrest (cocaine +) --> 100% p-mLAD after D1 (pLAD 60% & Ost Diag 75%) --> DES PCI to LAD & PTCA of ~small D1.  EF back up to 55-60% by Echo on d/c - distal anterior HK.   Alcohol abuse    CAD (coronary artery disease), native coronary artery    s/p DES PCI to LAD 01/2018 (Synergy DES 3 x 28 -- 3.6) acoss D1 with PTCA of jailed  D1.    Cocaine abuse (HCC)    HLD (hyperlipidemia)    Hypertension    Tobacco abuse     PAST SURGICAL HISTORY   Past Surgical History:  Procedure Laterality Date   CORONARY/GRAFT ACUTE MI REVASCULARIZATION N/A 01/03/2018   Procedure: Coronary/Graft Acute MI Revascularization- CORONARY STENT INTERVENTION;  Surgeon: Marykay Lex, MD;  Location: [facility] INVASIVE CV LAB; DES PCI pLAD: Synergy DES 3 x 28 (3.6) & PTCA of OstD1 (jailed)    RIGHT/LEFT HEART CATH AND CORONARY ANGIOGRAPHY N/A 01/03/2018   Procedure: RIGHT/LEFT HEART CATH  AND CORONARY ANGIOGRAPHY;  Surgeon: Marykay Lex, MD;  Location: [facility] INVASIVE CV LAB;  Service: Cardiovascular;;  (VF -> Ant STEMI): 100% mLAD, 80% 1st Diag & 65%ost RI --> DES PCI p-mLAD & PTCA of D1.  EF~45% w/ anterior anterolateral HK.  Minimally Elevated LVEDP & PCWP & normal RHC pressures   TRANSTHORACIC ECHOCARDIOGRAM  01/04/2018   (following Ant STEMI - VF arrest):  EF 55-60%. Mild HK of apical anterior wall c/w LAD ischemia. Gr 1 DD. Normal valves & chamber  sizes.      Post PCI 01/2018: Synergy DES 3.0 x 28 mm    MEDICATIONS/ALLERGIES   Current Meds  Medication Sig   AMBULATORY NON FORMULARY MEDICATION 0.2 mLs by Intracavernosal route as needed. Medication Name: Trimix  PGE Pap 30mg  Phent 1mg    carvedilol (COREG) 12.5 MG tablet Take 1 tablet (12.5 mg total) by mouth 2 (two) times daily with a meal.   ciprofloxacin (CILOXAN) 0.3 % ophthalmic solution instill 2 drops into both eyes four times a day x 7 days   clopidogrel (PLAVIX) 75 MG tablet Take 1 tablet (75 mg total) by mouth daily.   losartan (COZAAR) 25 MG tablet Take 1 tablet (25 mg total) by mouth at bedtime.   nitroGLYCERIN (NITROSTAT) 0.4 MG SL tablet Place 1 tablet (0.4 mg total) under the tongue every 5 (five) minutes as needed for chest pain.   rosuvastatin (CRESTOR) 40 MG tablet Take 1 tablet (40 mg total) by mouth daily.   senna (SENOKOT) 8.6 MG tablet Take 2 tablets by mouth at bedtime.   tadalafil, PAH, (ADCIRCA) 20 MG tablet TAKE 1 TABLET BY MOUTH 1/2 TO 1 HOUR PRIOR TO INTERCOURSE AS NEEDED. DO NOT USE NITROGLYCERIN 24 HRS PRIOR TO USE OR 48 HRS AFTER USE    No Active Allergies  SOCIAL HISTORY/FAMILY HISTORY   Reviewed in Epic:  Pertinent findings:  Social History   Tobacco Use   Smoking status: Former    Packs/day: .1    Types: Cigarettes    Quit date: 08/25/2018    Years since quitting: 3.9   Smokeless tobacco: Never  Vaping Use   Vaping Use: Never used  Substance Use Topics   Alcohol use: Not Currently   Drug use: Not Currently   Social History   Social History Narrative   Not on file    OBJCTIVE -PE, EKG, labs   Wt Readings from Last 3 Encounters:  07/22/22 238 lb 9.6 oz (108.2 kg)  01/28/22 237 lb 6.4 oz (107.7 kg)  08/06/20 254 lb (115.2 kg)    Physical Exam: BP 114/78 (BP Location: Right Arm, Patient Position: Sitting, Cuff Size: Normal)   Pulse 71   Ht 5\' 10"  (1.778 m)   Wt 238 lb 9.6 oz (108.2 kg)   SpO2 98%   BMI 34.24  kg/m  Physical Exam   Adult ECG Report  Rate: *** ;  Rhythm: {rhythm:17366};   Narrative Interpretation: ***  Recent Labs:  ***  Lab Results  Component Value Date   CHOL 218 (H) 01/28/2022   HDL 41 01/28/2022   LDLCALC 152 (H) 01/28/2022   TRIG 138 01/28/2022   CHOLHDL 5.3 (H) 01/28/2022   Lab Results  Component Value Date   CREATININE 0.92 01/28/2022   BUN 9 01/28/2022   NA 140 01/28/2022   K 3.9 01/28/2022   CL 101 01/28/2022   CO2 20 01/28/2022      Latest Ref Rng & Units 05/22/2021    2:01  AM 05/21/2021    3:12 AM 05/20/2021    6:33 PM  CBC  WBC 4.0 - 10.5 K/uL 5.3  5.5  6.5   Hemoglobin 13.0 - 17.0 g/dL 16.1  09.6  04.5   Hematocrit 39.0 - 52.0 % 34.6  33.3  36.0   Platelets 150 - 400 K/uL 172  155  163     Lab Results  Component Value Date   HGBA1C 5.7 (H) 01/06/2018   Lab Results  Component Value Date   TSH 1.410 04/21/2018    ================================================== I spent a total of ***minutes with the patient spent in direct patient consultation.  Additional time spent with chart review  / charting (studies, outside notes, etc): *** min Total Time: *** min  Current medicines are reviewed at length with the patient today.  (+/- concerns) ***  Notice: This dictation was prepared with Dragon dictation along with smart phrase technology. Any transcriptional errors that result from this process are unintentional and may not be corrected upon review.  Studies Ordered:   No orders of the defined types were placed in this encounter.  No orders of the defined types were placed in this encounter.   Patient Instructions / Medication Changes & Studies & Tests Ordered   There are no Patient Instructions on file for this visit.     Marykay Lex, MD, MS Bryan Lemma, M.D., M.S. Interventional Cardiologist  [facility]  Pager # [phone] Phone # [phone] [address] [place], [place] [address]   Thank  you for choosing [facility] [place]!!

## 2022-07-23 ENCOUNTER — Other Ambulatory Visit: Payer: Self-pay

## 2022-07-23 ENCOUNTER — Telehealth: Payer: Self-pay | Admitting: *Deleted

## 2022-07-23 ENCOUNTER — Encounter: Payer: Self-pay | Admitting: Cardiology

## 2022-07-23 DIAGNOSIS — I2510 Atherosclerotic heart disease of native coronary artery without angina pectoris: Secondary | ICD-10-CM

## 2022-07-23 DIAGNOSIS — E785 Hyperlipidemia, unspecified: Secondary | ICD-10-CM

## 2022-07-23 LAB — COMPREHENSIVE METABOLIC PANEL
ALT: 12 IU/L (ref 0–44)
AST: 17 IU/L (ref 0–40)
Albumin/Globulin Ratio: 1.6 (ref 1.2–2.2)
Albumin: 4.2 g/dL (ref 3.8–4.9)
Alkaline Phosphatase: 71 IU/L (ref 44–121)
BUN/Creatinine Ratio: 10 (ref 9–20)
BUN: 12 mg/dL (ref 6–24)
Bilirubin Total: 0.4 mg/dL (ref 0.0–1.2)
CO2: 23 mmol/L (ref 20–29)
Calcium: 9.3 mg/dL (ref 8.7–10.2)
Chloride: 105 mmol/L (ref 96–106)
Creatinine, Ser: 1.15 mg/dL (ref 0.76–1.27)
Globulin, Total: 2.7 g/dL (ref 1.5–4.5)
Glucose: 102 mg/dL — ABNORMAL HIGH (ref 70–99)
Potassium: 3.8 mmol/L (ref 3.5–5.2)
Sodium: 142 mmol/L (ref 134–144)
Total Protein: 6.9 g/dL (ref 6.0–8.5)
eGFR: 77 mL/min/{1.73_m2} (ref >59)

## 2022-07-23 LAB — LIPID PANEL
Chol/HDL Ratio: 5.2 ratio — ABNORMAL HIGH (ref 0.0–5.0)
Cholesterol, Total: 155 mg/dL (ref 100–199)
HDL: 30 mg/dL — ABNORMAL LOW (ref >39)
LDL Chol Calc (NIH): 95 mg/dL (ref 0–99)
Triglycerides: 174 mg/dL — ABNORMAL HIGH (ref 0–149)
VLDL Cholesterol Cal: 30 mg/dL (ref 5–40)

## 2022-07-23 MED ORDER — EZETIMIBE 10 MG PO TABS
10.0000 mg | ORAL_TABLET | Freq: Every day | ORAL | 3 refills | Status: DC
  Filled 2022-07-23 – 2022-07-30 (×2): qty 90, 90d supply, fill #0
  Filled 2022-11-21: qty 90, 90d supply, fill #1
  Filled 2023-02-11: qty 90, 90d supply, fill #2
  Filled 2023-06-02: qty 90, 90d supply, fill #3

## 2022-07-23 NOTE — Assessment & Plan Note (Signed)
BP well-controlled.  We can stop chlorthalidone and reduce losartan dose because of hypotension.  Now on low-dose losartan 25 mg daily and carvedilol 12.5 mg twice daily.

## 2022-07-23 NOTE — Assessment & Plan Note (Signed)
Large anterior MI complicated by VF arrest.  Had significant LAD bifurcation lesion on bifurcation PCI.  Thankfully, despite significant berry interpreter, EF is stable in normal range with no wall motion abnormalities.  Initially he had significant PTSD, but now is doing much better. Follow-up echo results showed EF 55 to 60%. On stable regimen.  Only lipids are not adequately controlled.

## 2022-07-23 NOTE — Telephone Encounter (Signed)
The patient has been notified of the result and verbalized understanding.  All questions (if any) were answered. Lab slip mailed to patient.  Tobin Chad, RN 07/23/2022 2:45 PM

## 2022-07-23 NOTE — Assessment & Plan Note (Addendum)
Last labs were not within goal.  He said that he had not been on his statin at the time of his last labs.  He is now back on statin and labs recheck today.  Will reassess labs.  Depending on results, anticipate referral to [facility] pharmacy clinic for initiation of more aggressive management with PCSK9 inhibitor, or potentially inclisiran.  Will need patient assistance.  Actually, with his traveling etc., may be best to even consider inclisiran which can be given with less frequency.

## 2022-07-23 NOTE — Telephone Encounter (Signed)
-----   Message from Marykay Lex, MD sent at 07/23/2022  2:47 AM EDT ----- Chemistry panel looks good.  Lipids look much better than 5 months ago with LDL down to 95 from 152 and total cholesterol down to 155 from 218.  However, we still want the LDL to be less than 70 if not closer to 55.    3 years ago labs looked great on same medication.  Lets have a good start Zetia 10 mg daily and recheck lipids and LFTs in 2 months.  If not at goal, then I think we probably need to consider being more aggressive and referred to [facility]  Bryan Lemma, MD

## 2022-07-23 NOTE — Assessment & Plan Note (Signed)
On tadalafil => counseled on timing of when he can and cannot use nitrates.  48 hours

## 2022-07-23 NOTE — Assessment & Plan Note (Signed)
With large jailed diagonal and significant LAD stent, plan is lifelong Plavix monotherapy. Okay to interrupt Plavix and hold for 5 to 7 days preop for surgeries or procedures.

## 2022-07-23 NOTE — Assessment & Plan Note (Signed)
LAD PCI with PTCA of the diagonal branch that was jailed.  RI also has about 60% stenosis.  Being treated medically.  RCA small with diffuse disease.  Plan: With large jailed diagonal and significant LAD stent, plan is lifelong Plavix monotherapy. Okay to interrupt Plavix and hold for 5 to 7 days preop for surgeries or procedures. On stable dose of carvedilol 12.5 mg twice daily & losartan 25 mg nightly. On high-dose rosuvastatin that was restarted last visit.  Labs were just checked today.  Very poorly controlled last visit.

## 2022-07-23 NOTE — Assessment & Plan Note (Signed)
He has not used since his MI.  He understands the risks now after his cardiac arrest.  I told him about the interaction with carvedilol and cocaine potentially needed life-threatening hypertension.

## 2022-07-30 ENCOUNTER — Other Ambulatory Visit: Payer: Self-pay

## 2022-07-30 ENCOUNTER — Other Ambulatory Visit (HOSPITAL_COMMUNITY): Payer: Self-pay

## 2022-08-05 ENCOUNTER — Other Ambulatory Visit (HOSPITAL_BASED_OUTPATIENT_CLINIC_OR_DEPARTMENT_OTHER): Payer: Self-pay

## 2022-11-21 ENCOUNTER — Other Ambulatory Visit: Payer: Self-pay | Admitting: Urology

## 2022-11-22 ENCOUNTER — Other Ambulatory Visit: Payer: Self-pay | Admitting: Urology

## 2022-11-22 ENCOUNTER — Other Ambulatory Visit: Payer: Self-pay

## 2022-11-25 ENCOUNTER — Other Ambulatory Visit: Payer: Self-pay

## 2022-11-26 ENCOUNTER — Other Ambulatory Visit: Payer: Self-pay

## 2022-11-26 MED ORDER — TADALAFIL (PAH) 20 MG PO TABS
ORAL_TABLET | ORAL | 9 refills | Status: DC
  Filled 2022-11-26 – 2023-02-11 (×2): qty 10, 30d supply, fill #0
  Filled 2023-04-02: qty 10, 30d supply, fill #1
  Filled 2023-06-02: qty 10, 30d supply, fill #2
  Filled 2023-09-10: qty 10, 30d supply, fill #3

## 2022-12-05 ENCOUNTER — Other Ambulatory Visit: Payer: Self-pay

## 2023-02-10 ENCOUNTER — Ambulatory Visit: Payer: No Typology Code available for payment source | Admitting: Cardiology

## 2023-02-12 ENCOUNTER — Other Ambulatory Visit: Payer: Self-pay

## 2023-02-13 ENCOUNTER — Ambulatory Visit: Payer: No Typology Code available for payment source | Attending: Cardiology | Admitting: Cardiology

## 2023-02-13 ENCOUNTER — Encounter: Payer: Self-pay | Admitting: Cardiology

## 2023-02-13 VITALS — BP 134/70 | HR 68 | Ht 70.0 in | Wt 258.0 lb

## 2023-02-13 DIAGNOSIS — F1410 Cocaine abuse, uncomplicated: Secondary | ICD-10-CM

## 2023-02-13 DIAGNOSIS — Z955 Presence of coronary angioplasty implant and graft: Secondary | ICD-10-CM

## 2023-02-13 DIAGNOSIS — I462 Cardiac arrest due to underlying cardiac condition: Secondary | ICD-10-CM

## 2023-02-13 DIAGNOSIS — N5201 Erectile dysfunction due to arterial insufficiency: Secondary | ICD-10-CM

## 2023-02-13 DIAGNOSIS — R55 Syncope and collapse: Secondary | ICD-10-CM | POA: Insufficient documentation

## 2023-02-13 DIAGNOSIS — I2510 Atherosclerotic heart disease of native coronary artery without angina pectoris: Secondary | ICD-10-CM

## 2023-02-13 DIAGNOSIS — E785 Hyperlipidemia, unspecified: Secondary | ICD-10-CM

## 2023-02-13 DIAGNOSIS — I2109 ST elevation (STEMI) myocardial infarction involving other coronary artery of anterior wall: Secondary | ICD-10-CM

## 2023-02-13 DIAGNOSIS — I10 Essential (primary) hypertension: Secondary | ICD-10-CM

## 2023-02-13 DIAGNOSIS — Z0289 Encounter for other administrative examinations: Secondary | ICD-10-CM

## 2023-02-13 LAB — HEPATIC FUNCTION PANEL
ALT: 23 [IU]/L (ref 0–44)
AST: 19 [IU]/L (ref 0–40)
Albumin: 4.2 g/dL (ref 3.8–4.9)
Alkaline Phosphatase: 67 [IU]/L (ref 44–121)
Bilirubin Total: 0.4 mg/dL (ref 0.0–1.2)
Bilirubin, Direct: 0.16 mg/dL (ref 0.00–0.40)
Total Protein: 7.3 g/dL (ref 6.0–8.5)

## 2023-02-13 LAB — LIPID PANEL
Chol/HDL Ratio: 2.6 {ratio} (ref 0.0–5.0)
Cholesterol, Total: 90 mg/dL — ABNORMAL LOW (ref 100–199)
HDL: 34 mg/dL — ABNORMAL LOW (ref >39)
LDL Chol Calc (NIH): 34 mg/dL (ref 0–99)
Triglycerides: 123 mg/dL (ref 0–149)
VLDL Cholesterol Cal: 22 mg/dL (ref 5–40)

## 2023-02-13 NOTE — Assessment & Plan Note (Signed)
Managed with Losartan 25mg  daily and Carvedilol 12.5mg  twice daily. -Continue current regimen.  Stressed the importance of making sure that he has eaten and had p.o. hydration sufficiently prior to taking his medicines in the morning.

## 2023-02-13 NOTE — Assessment & Plan Note (Signed)
PRN tadalafil

## 2023-02-13 NOTE — Assessment & Plan Note (Signed)
Has not used since his MI

## 2023-02-13 NOTE — Progress Notes (Signed)
Cardiology Office Note:  .   Date:  02/13/2023  ID:  Rodney Lloyd, DOB 1970-11-18, MRN [id number] PCP: Bing Neighbors, NP  [facility] Providers Cardiologist:  Bryan Lemma, MD     Chief Complaint  Patient presents with   Follow-up    7 months.  Had an episode of near syncope recently.   Coronary Artery Disease    Patient Profile: .     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  CAD: Anterior STEMI in November 2019 (VF arrest with bystander CPR-3 shocks by EMS Prox to mid LAD PCI w/ PTCA of D1.  RI of proximal 65%.  EF was 45 to 50%    Rodney Lloyd was last seen on 07/22/2022: Doing well from cardiac standpoint.  No major issues.  (No further use of cocaine since MI).  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  Exercise intolerance due to knee pain.  Negative cardiac ROS.  Subjective  Discussed the use of AI scribe software for clinical note transcription with the patient, who gave verbal consent to proceed.  History of Present Illness   Rodney Lloyd, a patient with a history of heart disease, presented for a six-month check-up. The patient reported an episode of lightheadedness and a brief collapse, which occurred while attending a court appearance. The patient did not lose consciousness during this episode and denied any associated chest pain, palpitations, or shortness of breath. The paramedics who attended to the patient at the time of the episode reported low blood pressure. The patient attributed this episode to lack of sleep, inadequate food and water intake, and taking blood pressure medication on the day of the incident.  The patient has been adhering to the prescribed medication regimen, which includes Crestor, Zetia, Carvedilol, Losartan, Plavix, and Tadalafil. The patient reported no  need for nitroglycerin and no side effects from Tadalafil.  The patient denied any chest pain, pressure, or tightness, and reported no shortness of breath except when engaging in rigorous activities or climbing stairs. The patient also denied any palpitations, attributing previous sensations of heart "fluttering" to nervousness. The patient reported no blood in stools or nosebleeds.  The patient also reported a weight gain of approximately 20 pounds and expressed a desire to focus on health in the upcoming year, including weight loss and increased physical activity. The patient denied any recent use of cocaine.  The patient also reported ongoing issues with a right knee problem, described as "bone on bone," but expressed a preference to avoid surgery if possible. The patient's cholesterol levels have improved significantly since the last visit, following the addition of Zetia to the medication regimen.     Near syncope episodes - poor sleep, no food/drink - was @ court appearance -- felt light headed / woozy & wobbly - no LOC.   Cardiovascular ROS: no chest pain or dyspnea on exertion positive for - Near syncope episodes - poor sleep, no food/drink - was @ court appearance -- felt light headed / woozy & wobbly - no LOC. negative for - chest pain, dyspnea on exertion, edema, irregular heartbeat, loss of consciousness, orthopnea, palpitations, paroxysmal nocturnal dyspnea, rapid heart rate, shortness of breath, or TIA or amaurosis fugax, claudication.  ROS:  Review of Systems - Negative except symptoms noted below-or in  HPI. Right knee pain.  Right thigh neuropathy.  Headaches, nervousness and anxiousness.  stress.    Objective   Medications - Losartan 25 mg daily - Carvedilol 12.5 mg twice a day - Zetia 20 mg daily - Crestor 40 mg daily - Tadalafil as needed - Plavix75 mg daily  Studies Reviewed: Marland Kitchen   EKG Interpretation Date/Time:  Thursday February 13 2023 09:37:49 EST Ventricular Rate:   68 PR Interval:  180 QRS Duration:  88 QT Interval:  400 QTC Calculation: 425 R Axis:   27  Text Interpretation: Normal sinus rhythm Possible Anterior infarct (cited on or before 05-Aug-2020) When compared with ECG of 05-Aug-2020 15:24, No significant change was found Confirmed by Bryan Lemma ([id number]) on 02/13/2023 9:44:40 AM    ETT 08/11/2019: (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  Reached 88% MPHR-heart rate 151 bpm.  8.6 METS.  No ischemia or infarction.  Cath-PCI for NSTEMI (01/03/2018)  CULPRIT BIFURCATION LESION: Prox LAD lesion is 60% stenosed with 75% stenosed side branch in Ost 1st Diag. Prox LAD to Mid LAD lesion is 100% stenosed. A drug-eluting stent was successfully placed (beginning proximal to diagonal branch) using a STENT SYNERGY DES 3X28. -Postdilated to 3.6 mm. Post intervention, there is a 0% residual stenosis in the LAD Balloon angioplasty was performed on Ost 1st Diag using a BALLOON SAPPHIRE 2.0X12. Post intervention, the side branch was reduced to 20% residual stenosis. ---------------------------------------------------------- Ost Ramus lesion is 65% stenosed. ---------------------------------------------------------- There is mild left ventricular systolic dysfunction. The left ventricular ejection fraction is 45-50% by visual estimate.  Dominance: Left       Intervention       No new studies Lab Results  Component Value Date   CHOL 90 (L) 02/12/2023   HDL 34 (L) 02/12/2023   LDLCALC 34 02/12/2023   TRIG 123 02/12/2023   CHOLHDL 2.6 02/12/2023   Lab Results  Component Value Date   NA 142 07/22/2022   K 3.8 07/22/2022   CREATININE 1.15 07/22/2022   EGFR 77 07/22/2022   GLUCOSE 102 (H) 07/22/2022   Risk Assessment/Calculations:              Physical Exam:   VS:  BP 134/70 (BP Location: Left Arm, Patient Position: Sitting, Cuff Size: Large)   Pulse 68   Ht 5\' 10"  (1.778 m)   Wt 258 lb (117 kg)   BMI 37.02 kg/m    Wt Readings from  Last 3 Encounters:  02/13/23 258 lb (117 kg)  07/22/22 238 lb 9.6 oz (108.2 kg)  01/28/22 237 lb 6.4 oz (107.7 kg)    GEN: Well nourished, well developed in no acute distress; well groomed - + wgt gain (poor diet); borderline morbidly obese. NECK: No JVD; No carotid bruits CARDIAC: Normal S1, S2; RRR, no murmurs, rubs, gallops RESPIRATORY:  Clear to auscultation without rales, wheezing or rhonchi ; nonlabored, good air movement. ABDOMEN: Soft, non-tender, non-distended EXTREMITIES:  No edema; No deformity     ASSESSMENT AND PLAN: .    Problem List Items Addressed This Visit       Cardiology Problems   Coronary artery disease involving native coronary artery without angina pectoris (Chronic)   Status post myocardial infarction with stent placement 5 years ago. No recent symptoms of angina or shortness of breath. -Continue Plavix for secondary prevention. -Encouraged weight loss to further reduce cardiovascular risk.  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  Did well  last time.      Relevant Orders   EKG 12-Lead (Completed)   Lipid panel   Comprehensive metabolic panel   Lipid panel   Comprehensive metabolic panel   Essential hypertension - Primary (Chronic)   Managed with Losartan 25mg  daily and Carvedilol 12.5mg  twice daily. -Continue current regimen.  Stressed the importance of making sure that he has eaten and had p.o. hydration sufficiently prior to taking his medicines in the morning.      Relevant Orders   EKG 12-Lead (Completed)   h/o Acute ST elevation myocardial infarction (STEMI) of anterolateral wall (HCC) (Chronic)   Just over 5 years out from MI.  Doing well.  EF stable on follow-up echocardiogram.  No further anginal symptoms.  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      Hyperlipidemia with target low density lipoprotein (LDL) cholesterol less than 55 mg/dL (Chronic)   Improved LDL levels with  Rosuvastatin 40mg  and Ezetimibe 10mg  daily. -Continue current regimen.  Most recent labs showed an LDL of 34.  Outstanding.  Hopefully he can lose back the weight he gained and this will improve as well. -Repeat lipid panel in 6 months.      Relevant Orders   Lipid panel   Comprehensive metabolic panel   Lipid panel   Comprehensive metabolic panel   Near syncope (Chronic)   Episode of lightheadedness and collapse likely secondary to hypotension from antihypertensive medications in the setting of poor oral intake and sleep deprivation. No loss of consciousness, palpitations, or chest pain reported. -Advised to ensure adequate hydration and nutrition before taking antihypertensive medications. -If feeling lightheaded or planning to use Tadalafil, consider holding Losartan.        Other   Cocaine abuse - history - NOT current (Chronic)   Has not used since his MI      Erectile dysfunction (Chronic)   PRN tadalafil      Presence of drug coated stent in LAD coronary artery (Chronic)   Remains on clopidogrel monotherapy based on bifurcation LAD-diagonal PCI with a jailed major diagonal branch and moderate disease in the ramus intermedius.  Okay to hold Plavix 5 to 7 days preop for surgeries or procedures. Also okay to hold Plavix 3 to 5 days for significant bleeding or bruising.        Follow-Up: Return in about 1 year (around 02/13/2024) for 1 Yr Follow-up. - 1 year for routine follow-up. -Obtain labs (lipid panel and CMP) in 6 months and again prior to next visit.   Informed Consent   Shared Decision Making/Informed Consent The risks [chest pain, shortness of breath, cardiac arrhythmias, dizziness, blood pressure fluctuations, myocardial infarction, stroke/transient ischemic attack, and life-threatening complications (estimated to be 1 in 10,000)], benefits (risk stratification, diagnosing coronary artery disease, treatment guidance) and alternatives of an exercise tolerance test  were discussed in detail with Rodney Lloyd and he agrees to proceed.      Total time spent: 18 min spent with patient + 14 min spent charting (reviewing previous studies, labs and notes as well as dictating) = 32 min  I spent 32 minutes in the care of SEMAJ TEEMS today including reviewing labs (2 minutes), face to face time discussing treatment options (18 minutes), reviewing records from existing chart (4 minutes), 8 minutes dictating, and documenting in the encounter.     Signed, Marykay Lex, MD, MS Bryan Lemma, M.D., M.S. Interventional Cardiologist  [facility]  Pager # [phone] Phone #  [phone] [address] [place], [place] [address]

## 2023-02-13 NOTE — Assessment & Plan Note (Signed)
Status post myocardial infarction with stent placement 5 years ago. No recent symptoms of angina or shortness of breath. -Continue Plavix for secondary prevention. -Encouraged weight loss to further reduce cardiovascular risk.  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  Did well last time.

## 2023-02-13 NOTE — Assessment & Plan Note (Signed)
Improved LDL levels with Rosuvastatin 40mg  and Ezetimibe 10mg  daily. -Continue current regimen.  Most recent labs showed an LDL of 34.  Outstanding.  Hopefully he can lose back the weight he gained and this will improve as well. -Repeat lipid panel in 6 months.

## 2023-02-13 NOTE — Assessment & Plan Note (Signed)
Episode of lightheadedness and collapse likely secondary to hypotension from antihypertensive medications in the setting of poor oral intake and sleep deprivation. No loss of consciousness, palpitations, or chest pain reported. -Advised to ensure adequate hydration and nutrition before taking antihypertensive medications. -If feeling lightheaded or planning to use Tadalafil, consider holding Losartan.

## 2023-02-13 NOTE — Patient Instructions (Addendum)
Medication Instructions:  No changes   *If you need a refill on your cardiac medications before your next appointment, please call your pharmacy*   Lab Work:  In 6 months ( June 2025 ) CMP Lipid   In 12 months prior to your annual appt  Dec 2025 CMP LIPID  If you have labs (blood work) drawn today and your tests are completely normal, you will receive your results only by: MyChart Message (if you have MyChart) OR A paper copy in the mail If you have any lab test that is abnormal or we need to change your treatment, we will call you to review the results.   Testing/Procedures: Not needed   Follow-Up: At [facility], you and your health needs are our priority.  As part of our continuing mission to provide you with exceptional heart care, we have created designated Provider Care Teams.  These Care Teams include your primary Cardiologist (physician) and Advanced Practice Providers (APPs -  Physician Assistants and Nurse Practitioners) who all work together to provide you with the care you need, when you need it.     Your next appointment:   12 month(s)  The format for your next appointment:   In Person  Provider:   Bryan Lemma, MD

## 2023-02-13 NOTE — Assessment & Plan Note (Signed)
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  Doing well.  EF stable on follow-up echocardiogram.  No further anginal symptoms.  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

## 2023-02-13 NOTE — Assessment & Plan Note (Signed)
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  Okay to hold Plavix 5 to 7 days preop for surgeries or procedures. Also okay to hold Plavix 3 to 5 days for significant bleeding or bruising.

## 2023-02-24 ENCOUNTER — Telehealth: Payer: Self-pay | Admitting: *Deleted

## 2023-02-24 NOTE — Telephone Encounter (Signed)
Per Marykay Lex, MD - sent message below: Rodney Lloyd.  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  We should contact him next week to see if he does indeed have an upcoming DOT physical.  If so, we probably would need to do a new GXT.  I will put my attestation below. DH

## 2023-02-24 NOTE — Telephone Encounter (Signed)
Called RN person spoke to patient . Patient sates he will not need anything currently. Patient states his physical is not until late next year.  RN  informed patient to contact at least 2- 3 months prior to physical so if he needs  GXT - it can be done on a timely matter. Patient verbalized understanding

## 2023-04-02 ENCOUNTER — Other Ambulatory Visit: Payer: Self-pay

## 2023-04-03 ENCOUNTER — Other Ambulatory Visit: Payer: Self-pay

## 2023-06-02 ENCOUNTER — Other Ambulatory Visit: Payer: Self-pay

## 2023-06-03 ENCOUNTER — Other Ambulatory Visit: Payer: Self-pay

## 2023-09-10 ENCOUNTER — Other Ambulatory Visit: Payer: Self-pay

## 2023-09-10 ENCOUNTER — Telehealth: Payer: Self-pay | Admitting: Cardiology

## 2023-09-10 ENCOUNTER — Other Ambulatory Visit: Payer: Self-pay | Admitting: Cardiology

## 2023-09-10 MED ORDER — ROSUVASTATIN CALCIUM 40 MG PO TABS
40.0000 mg | ORAL_TABLET | Freq: Every day | ORAL | 1 refills | Status: DC
  Filled 2023-09-10: qty 90, 90d supply, fill #0
  Filled 2023-12-22: qty 90, 90d supply, fill #1

## 2023-09-10 MED ORDER — CLOPIDOGREL BISULFATE 75 MG PO TABS
75.0000 mg | ORAL_TABLET | Freq: Every day | ORAL | 1 refills | Status: AC
  Filled 2023-09-10: qty 90, 90d supply, fill #0
  Filled 2023-12-22: qty 90, 90d supply, fill #1

## 2023-09-10 MED ORDER — LOSARTAN POTASSIUM 25 MG PO TABS
25.0000 mg | ORAL_TABLET | Freq: Every day | ORAL | 1 refills | Status: DC
  Filled 2023-09-10: qty 90, 90d supply, fill #0
  Filled 2023-12-22: qty 90, 90d supply, fill #1

## 2023-09-10 MED ORDER — CARVEDILOL 12.5 MG PO TABS
12.5000 mg | ORAL_TABLET | Freq: Two times a day (BID) | ORAL | 1 refills | Status: DC
  Filled 2023-09-10: qty 180, 90d supply, fill #0
  Filled 2023-12-22: qty 180, 90d supply, fill #1

## 2023-09-10 MED ORDER — EZETIMIBE 10 MG PO TABS
10.0000 mg | ORAL_TABLET | Freq: Every day | ORAL | 1 refills | Status: DC
  Filled 2023-09-10: qty 90, 90d supply, fill #0
  Filled 2023-12-22: qty 90, 90d supply, fill #1

## 2023-09-10 NOTE — Telephone Encounter (Signed)
*  STAT* If patient is at the pharmacy, call can be transferred to refill team.   1. Which medications need to be refilled? (please list name of each medication and dose if known) carvedilol  (COREG ) 12.5 MG tablet  clopidogrel  (PLAVIX ) 75 MG tablet   ezetimibe  (ZETIA ) 10 MG tablet    losartan  (COZAAR ) 25 MG tablet    rosuvastatin  (CRESTOR ) 40 MG tablet   2. Which pharmacy/location (including street and city if local pharmacy) is medication to be sent to?  [facility]      3. Do they need a 30 day or 90 day supply? 90 day    Pt is out of medication

## 2023-09-10 NOTE — Telephone Encounter (Signed)
 Pt's medications were sent to pt's pharmacy as requested. Confirmation received.

## 2023-09-12 ENCOUNTER — Other Ambulatory Visit: Payer: Self-pay

## 2023-12-22 ENCOUNTER — Other Ambulatory Visit: Payer: Self-pay | Admitting: Urology

## 2023-12-22 ENCOUNTER — Other Ambulatory Visit: Payer: Self-pay

## 2023-12-23 ENCOUNTER — Other Ambulatory Visit: Payer: Self-pay

## 2023-12-23 MED ORDER — TADALAFIL (PAH) 20 MG PO TABS
ORAL_TABLET | ORAL | 9 refills | Status: AC
  Filled 2023-12-23: qty 10, 30d supply, fill #0
  Filled 2024-03-19: qty 10, 30d supply, fill #1

## 2024-03-19 ENCOUNTER — Other Ambulatory Visit: Payer: Self-pay

## 2024-03-19 ENCOUNTER — Other Ambulatory Visit: Payer: Self-pay | Admitting: Cardiology

## 2024-03-22 NOTE — Telephone Encounter (Signed)
 In accordance with refill protocols, please review and address the following requirements before this medication refill can be authorized:  Appointment  and Labs   Pt called and message left for pt to call back to schedule an overdue appt.

## 2024-03-23 NOTE — Telephone Encounter (Signed)
 Patient needs to be seen for annual follow-up, but should have his medication refilled until he can be seen.  These medicines were listed on his last visit.  Would continue.

## 2024-03-24 ENCOUNTER — Other Ambulatory Visit: Payer: Self-pay

## 2024-03-24 MED ORDER — CARVEDILOL 12.5 MG PO TABS
12.5000 mg | ORAL_TABLET | Freq: Two times a day (BID) | ORAL | 0 refills | Status: AC
  Filled 2024-03-24: qty 180, 90d supply, fill #0

## 2024-03-24 MED ORDER — ROSUVASTATIN CALCIUM 40 MG PO TABS
40.0000 mg | ORAL_TABLET | Freq: Every day | ORAL | 0 refills | Status: AC
  Filled 2024-03-24: qty 90, 90d supply, fill #0

## 2024-03-24 MED ORDER — EZETIMIBE 10 MG PO TABS
10.0000 mg | ORAL_TABLET | Freq: Every day | ORAL | 0 refills | Status: AC
  Filled 2024-03-24: qty 90, 90d supply, fill #0

## 2024-03-24 MED ORDER — LOSARTAN POTASSIUM 25 MG PO TABS
25.0000 mg | ORAL_TABLET | Freq: Every day | ORAL | 0 refills | Status: AC
  Filled 2024-03-24: qty 90, 90d supply, fill #0

## 2024-03-24 NOTE — Telephone Encounter (Signed)
"   Medication refilled with 90 supply , needs an appointment to continue with  further refills "

## 2024-03-25 ENCOUNTER — Other Ambulatory Visit: Payer: Self-pay

## 2024-03-31 ENCOUNTER — Other Ambulatory Visit: Payer: Self-pay
